# Patient Record
Sex: Female | Born: 1955 | Race: Black or African American | Hispanic: No | Marital: Married | Smoking: Never smoker
Health system: Southern US, Community
[De-identification: ages and names within clinical notes are randomized; demographics above are authoritative.]

## PROBLEM LIST (undated history)

## (undated) DIAGNOSIS — I10 Essential (primary) hypertension: Secondary | ICD-10-CM

## (undated) DIAGNOSIS — I4891 Unspecified atrial fibrillation: Secondary | ICD-10-CM

## (undated) DIAGNOSIS — E785 Hyperlipidemia, unspecified: Secondary | ICD-10-CM

## (undated) HISTORY — DX: Unspecified atrial fibrillation: I48.91

## (undated) HISTORY — DX: Essential (primary) hypertension: I10

## (undated) HISTORY — DX: Hyperlipidemia, unspecified: E78.5

## (undated) HISTORY — PX: OTHER SURGICAL HISTORY: SHX169

---

## 1999-10-11 ENCOUNTER — Ambulatory Visit (HOSPITAL_COMMUNITY): Admission: RE | Admit: 1999-10-11 | Discharge: 1999-10-11 | Payer: Self-pay | Admitting: Family Medicine

## 1999-12-24 ENCOUNTER — Ambulatory Visit (HOSPITAL_COMMUNITY): Admission: RE | Admit: 1999-12-24 | Discharge: 1999-12-24 | Payer: Self-pay | Admitting: Family Medicine

## 1999-12-24 ENCOUNTER — Encounter: Payer: Self-pay | Admitting: Family Medicine

## 2001-09-27 ENCOUNTER — Ambulatory Visit (HOSPITAL_COMMUNITY): Admission: RE | Admit: 2001-09-27 | Discharge: 2001-09-27 | Payer: Self-pay | Admitting: Family Medicine

## 2001-09-27 ENCOUNTER — Encounter: Payer: Self-pay | Admitting: Family Medicine

## 2002-10-10 ENCOUNTER — Ambulatory Visit (HOSPITAL_COMMUNITY): Admission: RE | Admit: 2002-10-10 | Discharge: 2002-10-10 | Payer: Self-pay | Admitting: Family Medicine

## 2003-11-30 ENCOUNTER — Ambulatory Visit (HOSPITAL_COMMUNITY): Admission: RE | Admit: 2003-11-30 | Discharge: 2003-11-30 | Payer: Self-pay | Admitting: Family Medicine

## 2004-06-17 ENCOUNTER — Ambulatory Visit: Payer: Self-pay | Admitting: Family Medicine

## 2004-09-11 ENCOUNTER — Ambulatory Visit: Payer: Self-pay | Admitting: Family Medicine

## 2004-11-25 ENCOUNTER — Ambulatory Visit: Payer: Self-pay | Admitting: Family Medicine

## 2004-12-03 ENCOUNTER — Ambulatory Visit (HOSPITAL_COMMUNITY): Admission: RE | Admit: 2004-12-03 | Discharge: 2004-12-03 | Payer: Self-pay | Admitting: Family Medicine

## 2005-02-25 ENCOUNTER — Ambulatory Visit: Payer: Self-pay | Admitting: Family Medicine

## 2005-06-13 ENCOUNTER — Ambulatory Visit: Payer: Self-pay | Admitting: Family Medicine

## 2005-06-24 ENCOUNTER — Ambulatory Visit: Payer: Self-pay | Admitting: *Deleted

## 2005-09-16 ENCOUNTER — Ambulatory Visit: Payer: Self-pay | Admitting: Family Medicine

## 2005-12-04 ENCOUNTER — Ambulatory Visit (HOSPITAL_COMMUNITY): Admission: RE | Admit: 2005-12-04 | Discharge: 2005-12-04 | Payer: Self-pay | Admitting: Family Medicine

## 2006-01-05 ENCOUNTER — Ambulatory Visit: Payer: Self-pay | Admitting: Family Medicine

## 2006-06-29 ENCOUNTER — Ambulatory Visit: Payer: Self-pay | Admitting: Family Medicine

## 2007-03-08 ENCOUNTER — Ambulatory Visit (HOSPITAL_COMMUNITY): Admission: RE | Admit: 2007-03-08 | Discharge: 2007-03-08 | Payer: Self-pay | Admitting: Family Medicine

## 2007-03-16 ENCOUNTER — Ambulatory Visit: Payer: Self-pay | Admitting: Family Medicine

## 2007-04-14 ENCOUNTER — Encounter (INDEPENDENT_AMBULATORY_CARE_PROVIDER_SITE_OTHER): Payer: Self-pay | Admitting: *Deleted

## 2007-09-20 ENCOUNTER — Ambulatory Visit: Payer: Self-pay | Admitting: Internal Medicine

## 2007-10-18 ENCOUNTER — Ambulatory Visit: Payer: Self-pay | Admitting: Family Medicine

## 2007-10-18 LAB — CONVERTED CEMR LAB
ALT: 20 U/L
AST: 21 U/L
Albumin: 4.4 g/dL
Alkaline Phosphatase: 65 U/L
BUN: 14 mg/dL
Basophils Absolute: 0.1 K/uL
Basophils Relative: 1 %
CO2: 24 meq/L
Calcium: 9.4 mg/dL
Chloride: 104 meq/L
Cholesterol: 212 mg/dL — ABNORMAL HIGH
Creatinine, Ser: 0.75 mg/dL
Eosinophils Absolute: 0.2 K/uL
Eosinophils Relative: 4 %
Glucose, Bld: 72 mg/dL
HCT: 39 %
HDL: 65 mg/dL
Hemoglobin: 12.1 g/dL
LDL Cholesterol: 118 mg/dL — ABNORMAL HIGH
Lymphocytes Relative: 48 % — ABNORMAL HIGH
Lymphs Abs: 2.8 K/uL
MCHC: 31 g/dL
MCV: 78.6 fL
Monocytes Absolute: 0.5 K/uL
Monocytes Relative: 9 %
Neutro Abs: 2.3 K/uL
Neutrophils Relative %: 39 % — ABNORMAL LOW
Platelets: 302 K/uL
Potassium: 4.1 meq/L
RBC: 4.96 M/uL
RDW: 13.8 %
Sodium: 142 meq/L
TSH: 0.529 u[IU]/mL
Total Bilirubin: 0.4 mg/dL
Total CHOL/HDL Ratio: 3.3
Total Protein: 7.7 g/dL
Triglycerides: 146 mg/dL
VLDL: 29 mg/dL
WBC: 5.8 10*3/microliter

## 2008-03-20 ENCOUNTER — Ambulatory Visit (HOSPITAL_COMMUNITY): Admission: RE | Admit: 2008-03-20 | Discharge: 2008-03-20 | Payer: Self-pay | Admitting: Family Medicine

## 2008-05-18 ENCOUNTER — Ambulatory Visit: Payer: Self-pay | Admitting: Family Medicine

## 2008-12-01 ENCOUNTER — Ambulatory Visit: Payer: Self-pay | Admitting: Family Medicine

## 2009-02-23 ENCOUNTER — Ambulatory Visit: Payer: Self-pay | Admitting: Family Medicine

## 2009-04-04 ENCOUNTER — Ambulatory Visit: Payer: Self-pay | Admitting: Internal Medicine

## 2009-04-13 ENCOUNTER — Ambulatory Visit: Payer: Self-pay | Admitting: Internal Medicine

## 2009-07-26 ENCOUNTER — Ambulatory Visit: Payer: Self-pay | Admitting: Family Medicine

## 2012-01-14 ENCOUNTER — Ambulatory Visit: Payer: Self-pay | Admitting: Family Medicine

## 2012-01-22 ENCOUNTER — Encounter: Payer: Self-pay | Admitting: Family Medicine

## 2012-01-22 ENCOUNTER — Ambulatory Visit (INDEPENDENT_AMBULATORY_CARE_PROVIDER_SITE_OTHER): Payer: Commercial Managed Care - PPO | Admitting: Family Medicine

## 2012-01-22 VITALS — BP 123/80 | HR 75 | Temp 98.3°F | Resp 16 | Ht 64.0 in | Wt 186.2 lb

## 2012-01-22 DIAGNOSIS — I10 Essential (primary) hypertension: Secondary | ICD-10-CM

## 2012-01-22 DIAGNOSIS — M79609 Pain in unspecified limb: Secondary | ICD-10-CM

## 2012-01-22 DIAGNOSIS — M79672 Pain in left foot: Secondary | ICD-10-CM

## 2012-01-22 DIAGNOSIS — D62 Acute posthemorrhagic anemia: Secondary | ICD-10-CM

## 2012-01-22 DIAGNOSIS — R002 Palpitations: Secondary | ICD-10-CM

## 2012-01-22 DIAGNOSIS — M5380 Other specified dorsopathies, site unspecified: Secondary | ICD-10-CM

## 2012-01-22 DIAGNOSIS — M79671 Pain in right foot: Secondary | ICD-10-CM

## 2012-01-22 DIAGNOSIS — M62830 Muscle spasm of back: Secondary | ICD-10-CM

## 2012-01-22 MED ORDER — CYCLOBENZAPRINE HCL 10 MG PO TABS: ORAL_TABLET | ORAL | Status: DC

## 2012-01-22 NOTE — Patient Instructions (Signed)
Get OTC Vitamin D3  2000 IU and take 1 capsule daily along with a Multivitamin or get Caltrate + D and take it daily.

## 2012-01-22 NOTE — Progress Notes (Signed)
  Subjective:    Patient ID: Mandy Price, female    DOB: [birth year], 56 y.o.   MRN: [id number]  HPI  This 56 y.o. 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 She wears gel inserts to work but takes no OTC NSAIDs or soak feet in evening for relief.  She has chronic muscle spasms in upper and mid back; Cyclobenzaprine helps. She has HTN  and takes chronic medication (Tenoretic) without side effects. She has occasional palpitations that  she notices most at end of long busy day. Drinks 8 oz tea at work (no coffee).    Review of Systems  Constitutional: Negative.   Respiratory: Negative for cough, chest tightness and shortness of breath.   Cardiovascular: Positive for palpitations. Negative for chest pain and leg swelling.  Musculoskeletal: Positive for back pain. Negative for myalgias and joint swelling.  Neurological: Negative for dizziness, weakness, light-headedness and headaches.       Objective:   Physical Exam  Nursing note and vitals reviewed. Constitutional: She is oriented to person, place, and time. She appears well-developed and well-nourished. No distress.  HENT:  Head: Normocephalic and atraumatic.  Eyes: EOM are normal. No scleral icterus.  Cardiovascular: Normal rate.   Pulmonary/Chest: Effort normal. No respiratory distress.  Musculoskeletal: Normal range of motion. She exhibits no edema.       Bilat (feet): low arches  Neurological: She is alert and oriented to person, place, and time. No cranial nerve deficit.  Skin: Skin is warm and dry.        Assessment & Plan:   1. HTN (hypertension) -stable; cont. current medication  2. Palpitations - monitor; ECG at  Next visit at time of CPE  3. Muscle spasm of back - refill cyclobenzaprine 10 mg  1 tablet hs prn.  4. Foot pain, bilateral - continue gel inserts in shoes and have more than one pair of work shoes

## 2012-04-28 ENCOUNTER — Ambulatory Visit (INDEPENDENT_AMBULATORY_CARE_PROVIDER_SITE_OTHER): Payer: Commercial Managed Care - PPO | Admitting: Family Medicine

## 2012-04-28 VITALS — BP 112/72 | HR 76 | Temp 98.8°F | Resp 16 | Ht 64.5 in | Wt 180.8 lb

## 2012-04-28 DIAGNOSIS — Z01419 Encounter for gynecological examination (general) (routine) without abnormal findings: Secondary | ICD-10-CM

## 2012-04-28 DIAGNOSIS — M549 Dorsalgia, unspecified: Secondary | ICD-10-CM

## 2012-04-28 DIAGNOSIS — Z13220 Encounter for screening for lipoid disorders: Secondary | ICD-10-CM

## 2012-04-28 DIAGNOSIS — Z1231 Encounter for screening mammogram for malignant neoplasm of breast: Secondary | ICD-10-CM

## 2012-04-28 DIAGNOSIS — Z124 Encounter for screening for malignant neoplasm of cervix: Secondary | ICD-10-CM

## 2012-04-28 DIAGNOSIS — Z130 Encounter for screening for diseases of the blood and blood-forming organs and certain disorders involving the immune mechanism: Secondary | ICD-10-CM

## 2012-04-28 DIAGNOSIS — E669 Obesity, unspecified: Secondary | ICD-10-CM | POA: Insufficient documentation

## 2012-04-28 DIAGNOSIS — I10 Essential (primary) hypertension: Secondary | ICD-10-CM

## 2012-04-28 DIAGNOSIS — Z0000 Encounter for general adult medical examination without abnormal findings: Secondary | ICD-10-CM

## 2012-04-28 DIAGNOSIS — M546 Pain in thoracic spine: Secondary | ICD-10-CM | POA: Insufficient documentation

## 2012-04-28 LAB — POCT URINALYSIS DIPSTICK
Ketones, UA: NEGATIVE
Urobilinogen, UA: 0.2
pH, UA: 7.5

## 2012-04-28 LAB — COMPREHENSIVE METABOLIC PANEL
Alkaline Phosphatase: 60 U/L (ref 39–117)
BUN: 13 mg/dL (ref 6–23)
CO2: 27 mEq/L (ref 19–32)
Calcium: 9.7 mg/dL (ref 8.4–10.5)
Chloride: 104 mEq/L (ref 96–112)
Creat: 0.93 mg/dL (ref 0.50–1.10)
Sodium: 140 mEq/L (ref 135–145)
Total Bilirubin: 0.5 mg/dL (ref 0.3–1.2)
Total Protein: 8.1 g/dL (ref 6.0–8.3)

## 2012-04-28 LAB — CBC WITH DIFFERENTIAL/PLATELET
Basophils Relative: 1 % (ref 0–1)
Eosinophils Absolute: 0.1 10*3/uL (ref 0.0–0.7)
HCT: 39.6 % (ref 36.0–46.0)
Hemoglobin: 12.8 g/dL (ref 12.0–15.0)
MCV: 77.6 fL — ABNORMAL LOW (ref 78.0–100.0)
Monocytes Absolute: 0.3 10*3/uL (ref 0.1–1.0)
Monocytes Relative: 7 % (ref 3–12)
Neutro Abs: 2.2 10*3/uL (ref 1.7–7.7)
Platelets: 260 10*3/uL (ref 150–400)
RBC: 5.1 MIL/uL (ref 3.87–5.11)
RDW: 13.8 % (ref 11.5–15.5)
WBC: 5 10*3/uL (ref 4.0–10.5)

## 2012-04-28 LAB — LIPID PANEL
LDL Cholesterol: 152 mg/dL — ABNORMAL HIGH (ref 0–99)
Triglycerides: 121 mg/dL (ref <150)

## 2012-04-28 MED ORDER — ATENOLOL-CHLORTHALIDONE 50-25 MG PO TABS: 1.0000 | ORAL_TABLET | Freq: Every day | ORAL | Status: DC

## 2012-04-28 NOTE — Patient Instructions (Addendum)
Keeping You Healthy  Get These Tests  Blood Pressure- Have your blood pressure checked by your healthcare provider at least once a year.  Normal blood pressure is 120/80.  Weight- Have your body mass index (BMI) calculated to screen for obesity.  BMI is a measure of body fat based on height and weight.  You can calculate your own BMI at https://www.west-esparza.com/  Cholesterol- Have your cholesterol checked every year.  Diabetes- Have your blood sugar checked every year if you have high blood pressure, high cholesterol, a family history of diabetes or if you are overweight.  Pap Smear- Have a pap smear every 1 to 3 years if you have been sexually active.  If you are older than 65 and recent pap smears have been normal you may not need additional pap smears.  In addition, if you have had a hysterectomy  For benign disease additional pap smears are not necessary.  Mammogram-Yearly mammograms are essential for early detection of breast cancer  Screening for Colon Cancer- Colonoscopy starting at age 60. Screening may begin sooner depending on your family history and other health conditions.  Follow up colonoscopy as directed by your Gastroenterologist.  Screening for Osteoporosis- Screening begins at age 22 with bone density scanning, sooner if you are at higher risk for developing Osteoporosis.  Get these medicines  Calcium with Vitamin D- Your body requires 1200-1500 mg of Calcium a day and [phone] IU of Vitamin D a day.  You can only absorb 500 mg of Calcium at a time therefore Calcium must be taken in 2 or 3 separate doses throughout the day.  Hormones- Hormone therapy has been associated with increased risk for certain cancers and heart disease.  Talk to your healthcare provider about if you need relief from menopausal symptoms.  Aspirin- Ask your healthcare provider about taking Aspirin to prevent Heart Disease and Stroke.  Get these Immuniztions  Flu shot- Every fall. You have  declined this vaccine.  Pneumonia shot- Once after the age of 88; if you are younger ask your healthcare provider if you need a pneumonia shot.  Tetanus- Every ten years. You reported this was done in 2007.  Zostavax- Once after the age of 72 to prevent shingles.  Take these steps  Don't smoke- Your healthcare provider can help you quit. For tips on how to quit, ask your healthcare provider or go to www.smokefree.gov or call 1-800 QUIT-NOW.  Be physically active- Exercise 5 days a week for a minimum of 30 minutes.  If you are not already physically active, start slow and gradually work up to 30 minutes of moderate physical activity.  Try walking, dancing, bike riding, swimming, etc.  Eat a healthy diet- Eat a variety of healthy foods such as fruits, vegetables, whole grains, low fat milk, low fat cheeses, yogurt, lean meats, chicken, fish, eggs, dried beans, tofu, etc.  For more information go to www.thenutritionsource.org  Dental visit- Brush and floss teeth twice daily; visit your dentist twice a year.  Eye exam- Visit your Optometrist or Ophthalmologist yearly.  Drink alcohol in moderation- Limit alcohol intake to one drink or less a day.  Never drink and drive.  Depression- Your emotional health is as important as your physical health.  If you're feeling down or losing interest in things you normally enjoy, please talk to your healthcare provider.  Seat Belts- can save your life; always wear one  Smoke/Carbon Monoxide detectors- These detectors need to be installed on the appropriate level of your home.  Replace batteries at least once a year.  Violence- If anyone is threatening or hurting you, please tell your healthcare provider.  Living Will/ Health care power of attorney- Discuss with your healthcare provider and family.       Hypertension As your heart beats, it forces blood through your arteries. This force is your blood pressure. If the pressure is too high, it is  called hypertension (HTN) or high blood pressure. HTN is dangerous because you may have it and not know it. High blood pressure may mean that your heart has to work harder to pump blood. Your arteries may be narrow or stiff. The extra work puts you at risk for heart disease, stroke, and other problems.  Blood pressure consists of two numbers, a higher number over a lower, 110/72, for example. It is stated as "110 over 72." The ideal is below 120 for the top number (systolic) and under 80 for the bottom (diastolic). Write down your blood pressure today. You should pay close attention to your blood pressure if you have certain conditions such as:  Heart failure.  Prior heart attack.  Diabetes  Chronic kidney disease.  Prior stroke.  Multiple risk factors for heart disease. To see if you have HTN, your blood pressure should be measured while you are seated with your arm held at the level of the heart. It should be measured at least twice. A one-time elevated blood pressure reading (especially in the Emergency Department) does not mean that you need treatment. There may be conditions in which the blood pressure is different between your right and left arms. It is important to see your caregiver soon for a recheck. Most people have essential hypertension which means that there is not a specific cause. This type of high blood pressure may be lowered by changing lifestyle factors such as:  Stress.  Smoking.  Lack of exercise.  Excessive weight.  Drug/tobacco/alcohol use.  Eating less salt. Most people do not have symptoms from high blood pressure until it has caused damage to the body. Effective treatment can often prevent, delay or reduce that damage. TREATMENT  When a cause has been identified, treatment for high blood pressure is directed at the cause. There are a large number of medications to treat HTN. These fall into several categories, and your caregiver will help you select the  medicines that are best for you. Medications may have side effects. You should review side effects with your caregiver. If your blood pressure stays high after you have made lifestyle changes or started on medicines,   Your medication(s) may need to be changed.  Other problems may need to be addressed.  Be certain you understand your prescriptions, and know how and when to take your medicine.  Be sure to follow up with your caregiver within the time frame advised (usually within two weeks) to have your blood pressure rechecked and to review your medications.  If you are taking more than one medicine to lower your blood pressure, make sure you know how and at what times they should be taken. Taking two medicines at the same time can result in blood pressure that is too low. SEEK IMMEDIATE MEDICAL CARE IF:  You develop a severe headache, blurred or changing vision, or confusion.  You have unusual weakness or numbness, or a faint feeling.  You have severe chest or abdominal pain, vomiting, or breathing problems. MAKE SURE YOU:   Understand these instructions.  Will watch your condition.  Will get  help right away if you are not doing well or get worse. Document Released: 07/14/2005 Document Revised: 10/06/2011 Document Reviewed: 03/03/2008 [facility] Patient Information 2013 [place], [place].      Exercise to Lose Weight Exercise and a healthy diet may help you lose weight. Your doctor may suggest specific exercises. EXERCISE IDEAS AND TIPS  Choose low-cost things you enjoy doing, such as walking, bicycling, or exercising to workout videos.  Take stairs instead of the elevator.  Walk during your lunch break.  Park your car further away from work or school.  Go to a gym or an exercise class.  Start with 5 to 10 minutes of exercise each day. Build up to 30 minutes of exercise 4 to 6 days a week.  Wear shoes with good support and comfortable clothes.  Stretch before and after  working out.  Work out until you breathe harder and your heart beats faster.  Drink extra water when you exercise.  Do not do so much that you hurt yourself, feel dizzy, or get very short of breath. Exercises that burn about 150 calories:  Running 1  miles in 15 minutes.  Playing volleyball for 45 to 60 minutes.  Washing and waxing a car for 45 to 60 minutes.  Playing touch football for 45 minutes.  Walking 1  miles in 35 minutes.  Pushing a stroller 1  miles in 30 minutes.  Playing basketball for 30 minutes.  Raking leaves for 30 minutes.  Bicycling 5 miles in 30 minutes.  Walking 2 miles in 30 minutes.  Dancing for 30 minutes.  Shoveling snow for 15 minutes.  Swimming laps for 20 minutes.  Walking up stairs for 15 minutes.  Bicycling 4 miles in 15 minutes.  Gardening for 30 to 45 minutes.  Jumping rope for 15 minutes.  Washing windows or floors for 45 to 60 minutes. Document Released: 08/16/2010 Document Revised: 10/06/2011 Document Reviewed: 08/16/2010 [facility] Patient Information 2013 [place], [place].     Shingles Shingles is caused by the same virus that causes chickenpox (varicella zoster virus or VZV). Shingles often occurs many years or decades after having chickenpox. That is why it is more common in adults older than 50 years. The virus reactivates and breaks out as an infection in a nerve root. SYMPTOMS   The initial feeling (sensations) may be pain. This pain is usually described as:  Burning.  Stabbing.  Throbbing.  Tingling in the nerve root.  A red rash will follow in a couple days. The rash may occur in any area of the body and is usually on one side (unilateral) of the body in a band or belt-like pattern. The rash usually starts out as very small blisters (vesicles). They will dry up after 7 to 10 days. This is not usually a significant problem except for the pain it causes.  Long-lasting (chronic) pain is more likely in an elderly  person. It can last months to years. This condition is called postherpetic neuralgia. Shingles can be an extremely severe infection in someone with AIDS, a weakened immune system, or with forms of leukemia. It can also be severe if you are taking transplant medicines or other medicines that weaken the immune system. TREATMENT  Your caregiver will often treat you with:  Antiviral drugs.  Anti-inflammatory drugs.  Pain medicines. Bed rest is very important in preventing the pain associated with herpes zoster (postherpetic neuralgia). Application of heat in the form of a hot water bottle or electric heating pad or gentle  pressure with the hand is recommended to help with the pain or discomfort. PREVENTION  A varicella zoster vaccine is available to help protect against the virus. The Food and Drug Administration approved the varicella zoster vaccine for individuals 10 years of age and older. HOME CARE INSTRUCTIONS   Cool compresses to the area of rash may be helpful.  Only take over-the-counter or prescription medicines for pain, discomfort, or fever as directed by your caregiver.  Avoid contact with:  Babies.  Pregnant women.  Children with eczema.  Elderly people with transplants.  People with chronic illnesses, such as leukemia and AIDS.  If the area involved is on your face, you may receive a referral for follow-up to a specialist. It is very important to keep all follow-up appointments. This will help avoid eye complications, chronic pain, or disability. SEEK IMMEDIATE MEDICAL CARE IF:   You develop any pain (headache) in the area of the face or eye. This must be followed carefully by your caregiver or ophthalmologist. An infection in part of your eye (cornea) can be very serious. It could lead to blindness.  You do not have pain relief from prescribed medicines.  Your redness or swelling spreads.  The area involved becomes very swollen and painful.  You have a  fever.  You notice any red or painful lines extending away from the affected area toward your heart (lymphangitis).  Your condition is worsening or has changed. Document Released: 07/14/2005 Document Revised: 10/06/2011 Document Reviewed: 06/18/2009 [facility] Patient Information 2013 [place], [place].

## 2012-04-29 ENCOUNTER — Encounter: Payer: Self-pay | Admitting: Family Medicine

## 2012-04-29 LAB — VITAMIN D 25 HYDROXY (VIT D DEFICIENCY, FRACTURES): Vit D, 25-Hydroxy: 38 ng/mL (ref 30–89)

## 2012-04-29 NOTE — Progress Notes (Signed)
Subjective:    Patient ID: Mandy Price, female    DOB: 1955/10/22, 56 y.o.   MRN: [id number]  HPI This 56 y.o. African female is here for CPE?PAP/ She cannot recall last PAP or mammogram.  She has HTN and is compliant with medication. Another chronic problem is back pain which has not  worsened over the years (she has no "red flag" symptoms).   Colonoscopy: pt declines at this time.    Review of Systems  Constitutional: Negative for fever, diaphoresis, appetite change, fatigue and unexpected weight change.  HENT: Negative.   Eyes: Negative.  Negative for visual disturbance.  Respiratory: Negative for cough, chest tightness and shortness of breath.   Cardiovascular: Negative for chest pain, palpitations and leg swelling.  Gastrointestinal: Negative.   Genitourinary: Negative.   Musculoskeletal: Positive for back pain. Negative for myalgias, joint swelling, arthralgias and gait problem.  Skin: Negative.   Neurological: Negative.   Hematological: Negative.   Psychiatric/Behavioral: Negative.        Objective:   Physical Exam  Nursing note and vitals reviewed. Constitutional: She is oriented to person, place, and time. She appears well-developed and well-nourished. No distress.  HENT:  Head: Normocephalic and atraumatic.  Right Ear: Hearing, tympanic membrane, external ear and ear canal normal.  Left Ear: Hearing, tympanic membrane, external ear and ear canal normal.  Nose: Nose normal. No nasal deformity or septal deviation.  Mouth/Throat: Uvula is midline, oropharynx is clear and moist and mucous membranes are normal. No oral lesions. Normal dentition. No dental caries.  Eyes: Conjunctivae normal, EOM and lids are normal. Pupils are equal, round, and reactive to light. No scleral icterus.  Fundoscopic exam:      The right eye shows no AV nicking, no hemorrhage and no papilledema.       The left eye shows no AV nicking, no hemorrhage and no papilledema.  Neck: Normal range  of motion. Neck supple. No thyromegaly present.  Cardiovascular: Normal rate, regular rhythm, normal heart sounds and intact distal pulses.  Exam reveals no gallop and no friction rub.   No murmur heard. Pulmonary/Chest: Effort normal and breath sounds normal. No respiratory distress. She has no wheezes. She exhibits no tenderness.  Abdominal: Soft. She exhibits no distension, no abdominal bruit, no pulsatile midline mass and no mass. Bowel sounds are decreased. There is no hepatosplenomegaly. There is no tenderness. There is no guarding and no CVA tenderness.  Genitourinary: Rectum normal, vagina normal and uterus normal. Rectal exam shows no external hemorrhoid, no fissure, no mass, no tenderness and anal tone normal. Guaiac negative stool. No breast swelling, tenderness, discharge or bleeding. There is no rash, tenderness or lesion on the right labia. There is no rash, tenderness or lesion on the left labia. Uterus is not deviated, not enlarged, not fixed and not tender. Cervix exhibits no motion tenderness, no discharge and no friability. Right adnexum displays no mass, no tenderness and no fullness. Left adnexum displays no mass, no tenderness and no fullness. No erythema, tenderness or bleeding around the vagina. No vaginal discharge found.  Musculoskeletal: Normal range of motion. She exhibits no edema and no tenderness.       Back (thoracic area)- minimal tenderness in paravertebral muscles  Lymphadenopathy:    She has no cervical adenopathy.       Right: No inguinal adenopathy present.       Left: No inguinal adenopathy present.  Neurological: She is alert and oriented to person, place, and time. She has normal  reflexes. No cranial nerve deficit. She exhibits normal muscle tone. Coordination normal.  Skin: Skin is warm and dry. No rash noted. No erythema.  Psychiatric: She has a normal mood and affect. Her behavior is normal. Judgment and thought content normal.          Assessment &  Plan:   1. Routine general medical examination at a health care facility  POCT urinalysis dipstick, IFOBT POC (occult bld, rslt in office)  2. Encounter for cervical Pap smear with pelvic exam  Pap IG w/ reflex to HPV when ASC-U  3. HTN (hypertension)  Comprehensive metabolic panel RX: Atenolol-Chlorthalidone 50-25 mg  #90  3 RFs  4. Screening for hyperlipidemia  Lipid panel  5. Back pain  Vitamin D, 25-hydroxy RX: Cyclobenzaprine 10 mg 1 tablet hs prn  6. Screening for deficiency anemia  CBC with Differential  7. Other screening mammogram  MM Digital Screening

## 2012-04-30 ENCOUNTER — Encounter: Payer: Self-pay | Admitting: Family Medicine

## 2012-05-02 NOTE — Progress Notes (Signed)
Quick Note:  Please call pt and advise that the following labs are abnormal... Your cholesterol is above normal (LDL-"bad"- cholesterol is too high). Try to improve your diet and exercise regularly. Get OTC Omega-3 Fish Oil 1200 mg (if you are not allergic to fish) and take it daily.  A good brand to try is Schiff MegaRed Omega-3 [place] [employer]- it is a small capsule and is well tolerated.  The rest of your labs are normal.] PAP is normal/negative.  Copy to pt. ______

## 2012-05-17 ENCOUNTER — Ambulatory Visit (HOSPITAL_COMMUNITY)
Admission: RE | Admit: 2012-05-17 | Discharge: 2012-05-17 | Disposition: A | Discharge status: Home / Self Care | Payer: Commercial Managed Care - PPO | Source: Ambulatory Visit | Attending: Family Medicine | Admitting: Family Medicine

## 2012-05-17 DIAGNOSIS — Z1231 Encounter for screening mammogram for malignant neoplasm of breast: Secondary | ICD-10-CM | POA: Insufficient documentation

## 2012-06-23 ENCOUNTER — Ambulatory Visit: Payer: Commercial Managed Care - PPO | Admitting: Family Medicine

## 2012-07-01 ENCOUNTER — Ambulatory Visit: Payer: Commercial Managed Care - PPO | Admitting: Family Medicine

## 2013-01-07 ENCOUNTER — Ambulatory Visit: Payer: Commercial Managed Care - PPO | Admitting: Family Medicine

## 2013-01-18 ENCOUNTER — Ambulatory Visit: Payer: Commercial Managed Care - PPO

## 2013-01-18 ENCOUNTER — Encounter: Payer: Self-pay | Admitting: Family Medicine

## 2013-01-18 ENCOUNTER — Ambulatory Visit (INDEPENDENT_AMBULATORY_CARE_PROVIDER_SITE_OTHER): Payer: Commercial Managed Care - PPO | Admitting: Family Medicine

## 2013-01-18 DIAGNOSIS — M79609 Pain in unspecified limb: Secondary | ICD-10-CM

## 2013-01-18 DIAGNOSIS — I10 Essential (primary) hypertension: Secondary | ICD-10-CM

## 2013-01-18 MED ORDER — TRAMADOL HCL 50 MG PO TABS: ORAL_TABLET | ORAL | Status: DC

## 2013-01-18 MED ORDER — ATENOLOL-CHLORTHALIDONE 50-25 MG PO TABS: 1.0000 | ORAL_TABLET | Freq: Every day | ORAL | Status: DC

## 2013-01-18 MED ORDER — CYCLOBENZAPRINE HCL 10 MG PO TABS: ORAL_TABLET | ORAL | Status: DC

## 2013-01-18 NOTE — Patient Instructions (Addendum)
I have ordered a referral to Orthopedics for further evaluation of right arm pain; the xray does not show any abnormality. A staff member will contact you with the appointment time and place with the specialist.

## 2013-01-18 NOTE — Progress Notes (Signed)
S:  This 57 y.o African female c/o R upper arm pain, increasing for the last 3-4 months. She works in [employer] for 18 years; job involves repetative movement (chopping, mixing, stirring, etc). Moving that arm increases pain and pt cannot lay on right side. She is awaken with arm pain; pt denies numbness or loss of use. She has not taken any  OTC analgesics.  Pt also has chronic foot pain, attributed to many hours on feet at work. She wears appropriate footwear and has some relief w/ gel insoles. When she first puts insoles in, she has great relief of foot pain. After ~3 months, the pain is back to original level.   Re: HTN- pt is compliant w/ medication w/o adverse effects. She denies diaphoresis, CP or tightness, palpitations, edema, SOB or DOE, wheezing or cough, HA, dizziness, numbness or syncope.   Patient Active Problem List   Diagnosis Date Noted  . Back pain, thoracic 04/28/2012  . Obesity (BMI 30.0-34.9) 04/28/2012  . HTN (hypertension) 01/22/2012   PMHx, Soc Hx and Fam Hx reviewed.   O: Filed Vitals:   01/18/13 1550  BP: 110/72  Pulse: 68  Temp: 99.2 F (37.3 C)  Resp: 16   GEN: In NAD; WN,WD. HENT: [place]/AT. EOMI w/ clear conj/sclerae. EACs/nose/oroph normal. COR: RRR. No edema. Distal pulses 2+/=. LUNGS: Normal resp rate and effort. SKIN: W&D; no rashes or erythema. MS: MAEs; R arm- tender proximal humerus w/ decreased ROM w/ internal rotation. No deformity visible or palpable.        Feet- no deformities, swelling or edema; neurovascular intact. Tender to palpation at midfoot along 4th and 5th metatarsals. NEURO: A&O x 3; CNs intact. Motor- giveway in R upper ext/ anterior deltoid.  No sensory deficits.   [facility] reading (PRIMARY) by  Dr. Audria Nine:  R humerus- no fracture of dislocation.  A/P: Upper arm pain, right -  RX: Tramadol 50 mg 1 tab bid or 1 tab hs for pain.  Plan: Ambulatory referral to Orthopedic Surgery  HTN (hypertension)- Stable and controlled;  continue current medications.  Meds ordered this encounter  Medications  . DISCONTD: atenolol-chlorthalidone (TENORETIC) 50-25 MG per tablet    Sig: Take 1 tablet by mouth daily. NEED REFILLS  . OMEGA-3 KRILL OIL PO    Sig: Take by mouth daily.  . traMADol (ULTRAM) 50 MG tablet    Sig: Take 1 tablet twice a day for pain or 1 tablet at bedtime for pain.    Dispense:  30 tablet    Refill:  1  . cyclobenzaprine (FLEXERIL) 10 MG tablet    Sig: Take 1 tablet at bedtime as needed for muscle spasms.    Dispense:  30 tablet    Refill:  3  . atenolol-chlorthalidone (TENORETIC) 50-25 MG per tablet    Sig: Take 1 tablet by mouth daily.    Dispense:  30 tablet    Refill:  11   Re: foot pain- pt advised to change out gel insoles every month.

## 2013-06-20 ENCOUNTER — Other Ambulatory Visit: Payer: Self-pay | Admitting: Family Medicine

## 2013-06-20 DIAGNOSIS — Z1231 Encounter for screening mammogram for malignant neoplasm of breast: Secondary | ICD-10-CM

## 2013-06-21 ENCOUNTER — Ambulatory Visit (HOSPITAL_COMMUNITY)
Admission: RE | Admit: 2013-06-21 | Discharge: 2013-06-21 | Disposition: A | Discharge status: Home / Self Care | Payer: Commercial Managed Care - PPO | Source: Ambulatory Visit | Attending: Family Medicine | Admitting: Family Medicine

## 2013-06-21 DIAGNOSIS — Z1231 Encounter for screening mammogram for malignant neoplasm of breast: Secondary | ICD-10-CM

## 2013-07-04 ENCOUNTER — Telehealth: Payer: Self-pay | Admitting: Radiology

## 2013-07-04 ENCOUNTER — Ambulatory Visit: Payer: Commercial Managed Care - PPO

## 2013-07-04 ENCOUNTER — Ambulatory Visit (INDEPENDENT_AMBULATORY_CARE_PROVIDER_SITE_OTHER): Payer: Commercial Managed Care - PPO | Admitting: Emergency Medicine

## 2013-07-04 ENCOUNTER — Ambulatory Visit
Admission: RE | Admit: 2013-07-04 | Discharge: 2013-07-04 | Disposition: A | Discharge status: Home / Self Care | Payer: Commercial Managed Care - PPO | Source: Ambulatory Visit | Attending: Emergency Medicine | Admitting: Emergency Medicine

## 2013-07-04 VITALS — BP 124/76 | HR 72 | Temp 98.5°F | Resp 17 | Ht 65.0 in | Wt 187.0 lb

## 2013-07-04 DIAGNOSIS — K529 Noninfective gastroenteritis and colitis, unspecified: Secondary | ICD-10-CM

## 2013-07-04 DIAGNOSIS — R109 Unspecified abdominal pain: Secondary | ICD-10-CM

## 2013-07-04 DIAGNOSIS — K921 Melena: Secondary | ICD-10-CM

## 2013-07-04 DIAGNOSIS — N390 Urinary tract infection, site not specified: Secondary | ICD-10-CM

## 2013-07-04 LAB — POCT CBC
Granulocyte percent: 66 %G (ref 37–80)
HCT, POC: 41.5 % (ref 37.7–47.9)
Lymph, poc: 2.8 (ref 0.6–3.4)
MCV: 80.8 fL (ref 80–97)
MID (cbc): 0.5 (ref 0–0.9)
MPV: 9.2 fL (ref 0–99.8)
POC Granulocyte: 6.3 (ref 2–6.9)
POC LYMPH PERCENT: 29 %L (ref 10–50)
POC MID %: 5 %M (ref 0–12)
WBC: 9.6 10*3/uL (ref 4.6–10.2)

## 2013-07-04 LAB — POCT URINALYSIS DIPSTICK
Bilirubin, UA: NEGATIVE
Ketones, UA: NEGATIVE
Nitrite, UA: NEGATIVE
Spec Grav, UA: 1.025

## 2013-07-04 LAB — IFOBT (OCCULT BLOOD): IFOBT: POSITIVE

## 2013-07-04 LAB — POCT UA - MICROSCOPIC ONLY

## 2013-07-04 MED ORDER — IOHEXOL 300 MG/ML  SOLN
100.0000 mL | Freq: Once | INTRAMUSCULAR | Status: AC | PRN
  Administered 2013-07-04: 100 mL via INTRAVENOUS

## 2013-07-04 MED ORDER — CIPROFLOXACIN HCL 500 MG PO TABS: 500.0000 mg | ORAL_TABLET | Freq: Two times a day (BID) | ORAL | Status: DC

## 2013-07-04 MED ORDER — METRONIDAZOLE 500 MG PO TABS: 500.0000 mg | ORAL_TABLET | Freq: Three times a day (TID) | ORAL | Status: DC

## 2013-07-04 MED ORDER — IOHEXOL 300 MG/ML  SOLN
40.0000 mL | Freq: Once | INTRAMUSCULAR | Status: AC | PRN
  Administered 2013-07-04: 40 mL via ORAL

## 2013-07-04 NOTE — Telephone Encounter (Signed)
Dr Cleta Alberts has spoken to patient regarding CT results.

## 2013-07-04 NOTE — Progress Notes (Deleted)
   Subjective:    Patient ID: Mandy Price, female    DOB: 1955-10-10, 57 y.o.   MRN: [id number]  HPI    Review of Systems     Objective:   Physical Exam        Assessment & Plan:

## 2013-07-04 NOTE — Patient Instructions (Signed)
    Driving directions to [address] [place], [place], [place] [address] [id number]  - more info    [address]  [place], [place] [address]     1. Head south on [address] toward [address]      0.5 mi    2. Sharp left onto [address]      0.6 mi    3. Turn left onto the [address] ramp      0.2 mi    4. Merge onto [address]      3.0 mi    5. Continue straight to stay on [address]      0.4 mi    6. Slight left to stay on [facility]  Destination will be on the right     1.0 mi     [address] [place], [place] [address]

## 2013-07-04 NOTE — Progress Notes (Addendum)
Subjective:    Patient ID: Mandy Price, female    DOB: [birth year], 57 y.o.   MRN: [id number]  This chart was scribed for Viviann Spare Aldona Bryner-MD, by Ladona Ridgel Day, Scribe. This patient was seen in room 4 and the patient's care was started at 9:10 AM.  HPI Mandy Price is a 57 y.o. female who presents to the [facility] complaining of rectal bleeding and lower abdominal pain, onset last PM after dinner (she had soup for dinner).  She reports onset of severe lower abdominal pain last PM and was in the bathroom for 2 hours feeling nauseated, diaphoretic and reports fever/chills since last PM. She denies any previous similar episodes.   She reports while she was in the bathroom last PM, she had a BM with bright red blood. She reports had another BM w/bright red blood this AM.   She has not had a colonscopy She denies family hx of colon problems.  Past Medical History  Diagnosis Date  . Hypertension    No past surgical history on file. No family history on file. History   Social History  . Marital Status: Married    Spouse Name: N/A    Number of Children: N/A  . Years of Education: N/A   Occupational History  . Not on file.   Social History Main Topics  . Smoking status: Never Smoker   . Smokeless tobacco: Not on file  . Alcohol Use: No  . Drug Use: No  . Sexual Activity: Not on file   Other Topics Concern  . Not on file   Social History Narrative  . No narrative on file   No Known Allergies Patient Active Problem List   Diagnosis Date Noted  . Back pain, thoracic 04/28/2012  . Obesity (BMI 30.0-34.9) 04/28/2012  . HTN (hypertension) 01/22/2012   No orders of the defined types were placed in this encounter.    Review of Systems  Constitutional: Positive for fever and chills.  HENT: Negative for congestion.   Respiratory: Negative for cough and shortness of breath.   Cardiovascular: Negative for chest pain.  Gastrointestinal: Positive for  abdominal pain (lower abdominal pain) and blood in stool. Negative for nausea, vomiting and constipation.  Musculoskeletal: Negative for back pain.  Skin: Negative for color change.   Triage Vitals: BP 124/76  Pulse 72  Temp(Src) 98.5 F (36.9 C) (Oral)  Resp 17  Ht 5\' 5"  (1.651 m)  Wt 187 lb (84.823 kg)  BMI 31.12 kg/m2  SpO2 99%     Objective:   Physical Exam Physical Exam  Nursing note and vitals reviewed. Constitutional: Patient is oriented to person, place, and time. Patient appears well-developed and well-nourished. No distress.  HENT:  Head: Normocephalic and atraumatic.  Neck: Neck supple. No tracheal deviation present.  Cardiovascular: Normal rate, regular rhythm and normal heart sounds.   No murmur heard. Pulmonary/Chest: Effort normal and breath sounds normal. No respiratory distress. Patient has no wheezes. Patient has no rales.  Musculoskeletal: Normal range of motion.  Neurological: Patient is alert and oriented to person, place, and time.  Skin: Skin is warm and dry.  Psychiatric: Patient has a normal mood and affect. Patient's behavior is normal.  ABD there is tenderness in the left lower quadrant and left midabdomen without rebound. Rectal exam reveals no rectal masses there is a mucousy stool present. Results for orders placed in visit on 07/04/13  POCT UA - MICROSCOPIC ONLY      Result Value  Range   WBC, Ur, HPF, POC 2-5     RBC, urine, microscopic 2-3     Bacteria, U Microscopic 2+     Mucus, UA positive     Epithelial cells, urine per micros 4-6     Crystals, Ur, HPF, POC neg     Casts, Ur, LPF, POC neg     Yeast, UA neg    POCT URINALYSIS DIPSTICK      Result Value Range   Color, UA yellow     Clarity, UA clear     Glucose, UA neg     Bilirubin, UA neg     Ketones, UA neg     Spec Grav, UA 1.025     Blood, UA small     pH, UA 6.5     Protein, UA trace     Urobilinogen, UA 0.2     Nitrite, UA neg     Leukocytes, UA small (1+)    POCT CBC       Result Value Range   WBC 9.6  4.6 - 10.2 K/uL   Lymph, poc 2.8  0.6 - 3.4   POC LYMPH PERCENT 29.0  10 - 50 %L   MID (cbc) 0.5  0 - 0.9   POC MID % 5.0  0 - 12 %M   POC Granulocyte 6.3  2 - 6.9   Granulocyte percent 66.0  37 - 80 %G   RBC 5.13  4.04 - 5.48 M/uL   Hemoglobin 12.6  12.2 - 16.2 g/dL   HCT, POC 96.0  45.4 - 47.9 %   MCV 80.8  80 - 97 fL   MCH, POC 24.6 (*) 27 - 31.2 pg   MCHC 30.4 (*) 31.8 - 35.4 g/dL   RDW, POC 09.8     Platelet Count, POC 260  142 - 424 K/uL   MPV 9.2  0 - 99.8 fL  IFOBT (OCCULT BLOOD)      Result Value Range   IFOBT Positive    [facility] reading (PRIMARY) by  Dr. Cleta Alberts there is no free air. There are no dilated loops of bowel. No acute changes are seen.      Assessment & Plan:   I. suspect this represents some form of colitis. We'll get a CT abdomen and pelvis if positive treat with Flagyl/ Cipro. referral made to GI since she has never had a colonoscopy.I personally performed the services described in this documentation, which was scribed in my presence. The recorded information has been reviewed and is accurate. CT scan shows a colitis. We'll treat with Cipro 500 twice a day for 10 days and Flagyl 500 twice a day for 10 days recheck in 48 hours

## 2013-07-05 ENCOUNTER — Encounter: Payer: Self-pay | Admitting: Internal Medicine

## 2013-07-05 LAB — URINE CULTURE

## 2013-07-06 ENCOUNTER — Ambulatory Visit (INDEPENDENT_AMBULATORY_CARE_PROVIDER_SITE_OTHER): Payer: Commercial Managed Care - PPO | Admitting: Emergency Medicine

## 2013-07-06 VITALS — BP 118/70 | HR 75 | Temp 98.0°F | Resp 16 | Ht 65.0 in | Wt 183.0 lb

## 2013-07-06 DIAGNOSIS — R109 Unspecified abdominal pain: Secondary | ICD-10-CM

## 2013-07-06 DIAGNOSIS — K529 Noninfective gastroenteritis and colitis, unspecified: Secondary | ICD-10-CM

## 2013-07-06 DIAGNOSIS — K5289 Other specified noninfective gastroenteritis and colitis: Secondary | ICD-10-CM

## 2013-07-06 LAB — POCT CBC
Granulocyte percent: 54.8 %G (ref 37–80)
MID (cbc): 0.5 (ref 0–0.9)
POC LYMPH PERCENT: 37.1 %L (ref 10–50)
RBC: 4.94 M/uL (ref 4.04–5.48)

## 2013-07-06 NOTE — Patient Instructions (Signed)
Recheck next Wednesday morning after 8:00

## 2013-07-06 NOTE — Progress Notes (Signed)
   Subjective:    Patient ID: Mandy Price, female    DOB: Mar 05, 1956, 57 y.o.   MRN: [id number]  HPI Pt here to follow up for colitis. Pt states she is feeling better. She states she has never had a colonscopy, but she has an appointment on Jan 8th to see GI. She states she has not had any vomitting, no hematochezia. She denies travel anywhere. She has never had anything else like this before. Husband has not been ill. She has been taking both abx.    Review of Systems     Objective:   Physical Exam chest is clear abdomen is flat normal bowel sounds there is still mild tenderness deep in the left lower abdomen but significantly better than the other day.   IMPRESSION:  1. Diffuse wall thickening in the descending colon, favoring  infectious colitis.  2. Diffuse hepatic steatosis.  3. Lower lumbar degenerative disc disease. Results for orders placed in visit on 07/06/13  POCT CBC      Result Value Range   WBC 6.4  4.6 - 10.2 K/uL   Lymph, poc 2.4  0.6 - 3.4   POC LYMPH PERCENT 37.1  10 - 50 %L   MID (cbc) 0.5  0 - 0.9   POC MID % 8.1  0 - 12 %M   POC Granulocyte 3.5  2 - 6.9   Granulocyte percent 54.8  37 - 80 %G   RBC 4.94  4.04 - 5.48 M/uL   Hemoglobin 12.2  12.2 - 16.2 g/dL   HCT, POC 11.9  14.7 - 47.9 %   MCV 80.5  80 - 97 fL   MCH, POC 24.7 (*) 27 - 31.2 pg   MCHC 30.7 (*) 31.8 - 35.4 g/dL   RDW, POC 82.9     Platelet Count, POC 282  142 - 424 K/uL   MPV 8.9  0 - 99.8 fL       Assessment & Plan:  White blood count is now down to normal. We'll recheck in one week

## 2013-08-04 ENCOUNTER — Ambulatory Visit: Payer: Commercial Managed Care - PPO | Admitting: Internal Medicine

## 2013-08-26 ENCOUNTER — Encounter: Payer: Self-pay | Admitting: Emergency Medicine

## 2014-01-26 ENCOUNTER — Encounter: Payer: Self-pay | Admitting: Family Medicine

## 2014-01-26 ENCOUNTER — Ambulatory Visit (INDEPENDENT_AMBULATORY_CARE_PROVIDER_SITE_OTHER): Payer: Commercial Managed Care - PPO | Admitting: Family Medicine

## 2014-01-26 VITALS — BP 119/69 | HR 70 | Temp 98.4°F | Resp 16 | Ht 65.0 in | Wt 182.0 lb

## 2014-01-26 DIAGNOSIS — Z0000 Encounter for general adult medical examination without abnormal findings: Secondary | ICD-10-CM

## 2014-01-26 DIAGNOSIS — R002 Palpitations: Secondary | ICD-10-CM

## 2014-01-26 DIAGNOSIS — M62830 Muscle spasm of back: Secondary | ICD-10-CM

## 2014-01-26 DIAGNOSIS — E669 Obesity, unspecified: Secondary | ICD-10-CM

## 2014-01-26 DIAGNOSIS — I10 Essential (primary) hypertension: Secondary | ICD-10-CM

## 2014-01-26 DIAGNOSIS — M5380 Other specified dorsopathies, site unspecified: Secondary | ICD-10-CM

## 2014-01-26 LAB — POCT URINALYSIS DIPSTICK
BILIRUBIN UA: NEGATIVE
Glucose, UA: NEGATIVE
KETONES UA: NEGATIVE
Nitrite, UA: NEGATIVE
PH UA: 7
Protein, UA: NEGATIVE
SPEC GRAV UA: 1.015
Urobilinogen, UA: 0.2

## 2014-01-26 LAB — POCT GLYCOSYLATED HEMOGLOBIN (HGB A1C): Hemoglobin A1C: 5.7

## 2014-01-26 MED ORDER — CYCLOBENZAPRINE HCL 10 MG PO TABS: ORAL_TABLET | ORAL | Status: DC

## 2014-01-26 MED ORDER — ATENOLOL-CHLORTHALIDONE 50-25 MG PO TABS: 1.0000 | ORAL_TABLET | Freq: Every day | ORAL | Status: DC

## 2014-01-26 NOTE — Progress Notes (Signed)
Subjective:    Patient ID: Mandy Price, female    DOB: 14-Jan-1956, 58 y.o.   MRN: [id number]  HPI  This 58 y.o. African female is here for annual CPE. She has well controlled HTN but c/o occasional palpitations. Chronic muscles spasms in back due to work -related activities are relieved w/ cyclobenzaprine prn.  HCM: MMG- Current (Nov 2014).           PAP- Oct 2013 (negative).           CRS- Pt declines referral.           IMM- Current but does not get Flu vaccine.   PMHx, Surg Hx, Soc and Fam Hx reviewed.  Review of Systems  Constitutional: Negative.   HENT: Negative.   Eyes: Negative.   Respiratory: Negative.   Cardiovascular: Positive for palpitations. Negative for chest pain.  Gastrointestinal: Negative.   Endocrine: Negative.   Genitourinary: Negative.   Musculoskeletal: Positive for back pain and myalgias. Negative for arthralgias and gait problem.  Skin: Negative.   Allergic/Immunologic: Negative.   Neurological: Negative.   Hematological: Negative.   Psychiatric/Behavioral: Negative.        Objective:   Physical Exam  Nursing note and vitals reviewed. Constitutional: She is oriented to person, place, and time. Vital signs are normal. She appears well-developed and well-nourished. No distress.  HENT:  Head: Normocephalic and atraumatic.  Right Ear: Hearing, tympanic membrane, external ear and ear canal normal.  Left Ear: Hearing, tympanic membrane, external ear and ear canal normal.  Nose: Nose normal. No mucosal edema, nasal deformity or septal deviation.  Mouth/Throat: Uvula is midline, oropharynx is clear and moist and mucous membranes are normal. No oral lesions. No dental caries.  Eyes: EOM and lids are normal. Pupils are equal, round, and reactive to light. Right conjunctiva is injected. Left conjunctiva is injected. No scleral icterus.  Fundoscopic exam:      The right eye shows no papilledema. The right eye shows red reflex.       The left eye shows  no papilledema. The left eye shows no red reflex.  Due for vision evaluation w/ a specialist.  Neck: Trachea normal, normal range of motion and full passive range of motion without pain. Neck supple. No JVD present. No spinous process tenderness and no muscular tenderness present. Carotid bruit is not present. No mass and no thyromegaly present.  Cardiovascular: Normal rate, regular rhythm, S1 normal, S2 normal, normal heart sounds and normal pulses.   No extrasystoles are present. PMI is not displaced.  Exam reveals no gallop, no S4 and no friction rub.   Pulmonary/Chest: Effort normal and breath sounds normal. No respiratory distress. She has no decreased breath sounds. Right breast exhibits no inverted nipple, no mass, no nipple discharge, no skin change and no tenderness. Left breast exhibits no inverted nipple, no mass, no nipple discharge, no skin change and no tenderness. Breasts are symmetrical.  Abdominal: Soft. Normal appearance and bowel sounds are normal. She exhibits no distension, no abdominal bruit, no pulsatile midline mass and no mass. There is no hepatosplenomegaly. There is no tenderness. There is no guarding and no CVA tenderness.  Genitourinary:  Deferred.  Musculoskeletal:       Cervical back: She exhibits tenderness and spasm. She exhibits normal range of motion, no swelling and no deformity.       Thoracic back: She exhibits tenderness. She exhibits normal range of motion, no deformity, no pain and no spasm.  Lumbar back: Normal.  Remainder of exam unremarkable.  Lymphadenopathy:       Head (right side): No submental, no submandibular, no tonsillar, no preauricular, no posterior auricular and no occipital adenopathy present.       Head (left side): No submental, no submandibular, no tonsillar, no preauricular, no posterior auricular and no occipital adenopathy present.    She has no cervical adenopathy.    She has no axillary adenopathy.       Right: No inguinal and no  supraclavicular adenopathy present.       Left: No inguinal and no supraclavicular adenopathy present.  Neurological: She is alert and oriented to person, place, and time. She has normal strength. She displays no atrophy and no tremor. No cranial nerve deficit or sensory deficit. She exhibits normal muscle tone. Coordination and gait normal. She displays no Babinski's sign on the right side. She displays no Babinski's sign on the left side.  Reflex Scores:      Tricep reflexes are 1+ on the right side and 1+ on the left side.      Bicep reflexes are 1+ on the right side and 1+ on the left side.      Brachioradialis reflexes are 1+ on the right side and 1+ on the left side.      Patellar reflexes are 2+ on the right side and 2+ on the left side. Skin: Skin is warm, dry and intact. No ecchymosis, no lesion and no rash noted. She is not diaphoretic. No cyanosis or erythema. Nails show no clubbing.  Psychiatric: She has a normal mood and affect. Her speech is normal and behavior is normal. Judgment and thought content normal. Cognition and memory are normal.    Results for orders placed in visit on 01/26/14  POCT GLYCOSYLATED HEMOGLOBIN (HGB A1C)      Result Value Ref Range   Hemoglobin A1C 5.7    POCT URINALYSIS DIPSTICK      Result Value Ref Range   Color, UA yellow     Clarity, UA cloudy     Glucose, UA neg     Bilirubin, UA neg     Ketones, UA neg     Spec Grav, UA 1.015     Blood, UA small     pH, UA 7.0     Protein, UA neg     Urobilinogen, UA 0.2     Nitrite, UA neg     Leukocytes, UA small (1+)      ECG: Sinus rhythm; nonspecific inferior St-T waves changes. No ectopy.     Assessment & Plan:  Routine general medical examination at a health care facility - Plan: EKG 12-Lead, IFOBT POC (occult bld, rslt in office), POCT glycosylated hemoglobin (Hb A1C), COMPLETE METABOLIC PANEL WITH GFR, Thyroid Panel With TSH, Lipid panel, POCT urinalysis dipstick  Pt given 3 hemosures to  complete at home and return to clinic within next 7-10 days.  Essential hypertension - Plan: EKG 12-Lead, COMPLETE METABOLIC PANEL WITH GFR  Palpitations - Plan: Thyroid Panel With TSH  Muscle spasm of back  Obesity (BMI 30.0-34.9)- Encouraged TLCs w/ focus on good nutrition and regular exercise.  Meds ordered this encounter  Medications  . atenolol-chlorthalidone (TENORETIC) 50-25 MG per tablet    Sig: Take 1 tablet by mouth daily.    Dispense:  30 tablet    Refill:  11  . cyclobenzaprine (FLEXERIL) 10 MG tablet    Sig: Take 1 tablet at bedtime as needed  for muscle spasms.    Dispense:  30 tablet    Refill:  3

## 2014-01-26 NOTE — Patient Instructions (Addendum)
Keeping You Healthy  Get These Tests  Blood Pressure- Have your blood pressure checked by your healthcare provider at least once a year.  Normal blood pressure is 120/80.  Weight- Have your body mass index (BMI) calculated to screen for obesity.  BMI is a measure of body fat based on height and weight.  You can calculate your own BMI at https://www.west-esparza.com/  Cholesterol- Have your cholesterol checked every year.  Diabetes- Have your blood sugar checked every year if you have high blood pressure, high cholesterol, a family history of diabetes or if you are overweight.  Pap Smear- Have a pap smear every 1 to 3 years if you have been sexually active.  If you are older than 65 and recent pap smears have been normal you may not need additional pap smears.  In addition, if you have had a hysterectomy  For benign disease additional pap smears are not necessary.  Mammogram-Yearly mammograms are essential for early detection of breast cancer  Screening for Colon Cancer- Colonoscopy starting at age 45. Screening may begin sooner depending on your family history and other health conditions.  Follow up colonoscopy as directed by your Gastroenterologist. You have declined to be referred for colonoscopy. You have been given stool sampling containers to take home for 3 separate stool specimens. Return them to [place] so that we can test your stools for blood.  Screening for Osteoporosis- Screening begins at age 68 with bone density scanning, sooner if you are at higher risk for developing Osteoporosis.  Get these medicines  Calcium with Vitamin D- Your body requires 1200-1500 mg of Calcium a day and [phone] IU of Vitamin D a day.  You can only absorb 500 mg of Calcium at a time therefore Calcium must be taken in 2 or 3 separate doses throughout the day.  Hormones- Hormone therapy has been associated with increased risk for certain cancers and heart disease.  Talk to your healthcare provider about if you  need relief from menopausal symptoms.  Aspirin- Ask your healthcare provider about taking Aspirin to prevent Heart Disease and Stroke.  Get these Immuniztions  Flu shot- Every fall  Pneumonia shot- Once after the age of 54; if you are younger ask your healthcare provider if you need a pneumonia shot.  Tetanus- Every ten years.  Zostavax- Once after the age of 34 to prevent shingles.  Take these steps  Don't smoke- Your healthcare provider can help you quit. For tips on how to quit, ask your healthcare provider or go to www.smokefree.gov or call 1-800 QUIT-NOW.  Be physically active- Exercise 5 days a week for a minimum of 30 minutes.  If you are not already physically active, start slow and gradually work up to 30 minutes of moderate physical activity.  Try walking, dancing, bike riding, swimming, etc.  Eat a healthy diet- Eat a variety of healthy foods such as fruits, vegetables, whole grains, low fat milk, low fat cheeses, yogurt, lean meats, chicken, fish, eggs, dried beans, tofu, etc.  For more information go to www.thenutritionsource.org  Dental visit- Brush and floss teeth twice daily; visit your dentist twice a year.  Eye exam- Visit your Optometrist or Ophthalmologist yearly.  Drink alcohol in moderation- Limit alcohol intake to one drink or less a day.  Never drink and drive.  Depression- Your emotional health is as important as your physical health.  If you're feeling down or losing interest in things you normally enjoy, please talk to your healthcare provider.  Seat Belts-  can save your life; always wear one  Smoke/Carbon Monoxide detectors- These detectors need to be installed on the appropriate level of your home.  Replace batteries at least once a year.  Violence- If anyone is threatening or hurting you, please tell your healthcare provider.  Living Will/ Health care power of attorney- Discuss with your healthcare provider and family.    Exercise to Lose  Weight Exercise and a healthy diet may help you lose weight. Your doctor may suggest specific exercises. EXERCISE IDEAS AND TIPS  Choose low-cost things you enjoy doing, such as walking, bicycling, or exercising to workout videos.  Take stairs instead of the elevator.  Walk during your lunch break.  Park your car further away from work or school.  Go to a gym or an exercise class.  Start with 5 to 10 minutes of exercise each day. Build up to 30 minutes of exercise 4 to 6 days a week.  Wear shoes with good support and comfortable clothes.  Stretch before and after working out.  Work out until you breathe harder and your heart beats faster.  Drink extra water when you exercise.  Do not do so much that you hurt yourself, feel dizzy, or get very short of breath. Exercises that burn about 150 calories:  Running 1  miles in 15 minutes.  Playing volleyball for 45 to 60 minutes.  Washing and waxing a car for 45 to 60 minutes.  Playing touch football for 45 minutes.  Walking 1  miles in 35 minutes.  Pushing a stroller 1  miles in 30 minutes.  Playing basketball for 30 minutes.  Raking leaves for 30 minutes.  Bicycling 5 miles in 30 minutes.  Walking 2 miles in 30 minutes.  Dancing for 30 minutes.  Shoveling snow for 15 minutes.  Swimming laps for 20 minutes.  Walking up stairs for 15 minutes.  Bicycling 4 miles in 15 minutes.  Gardening for 30 to 45 minutes.  Jumping rope for 15 minutes.  Washing windows or floors for 45 to 60 minutes. Document Released: 08/16/2010 Document Revised: 10/06/2011 Document Reviewed: 08/16/2010 [facility] Patient Information 2015 [place], [place]. This information is not intended to replace advice given to you by your health care provider. Make sure you discuss any questions you have with your health care provider.    Women and Heart Disease Heart disease (HD) risk factors for both men and women are similar. Most studies  addressing the diagnosis and treatment of heart disease have focused primarily on men. More research is now being done on women and heart disease.  GENDER DIFFERENCES  Symptoms of a heart attack for women may be more subtle, less typical and harder to identify.  Women are often slow to recognize heart disease risk factors and symptoms.  More women than men die from a heart attack before reaching a hospital.  Results of medical tests can vary by gender, especially electrocardiogram stress testing.  A common perception is that men are more likely to have heart problems. This is not true, especially in women after menopause.  Effects of estrogen, birth control and hormone therapy can have unique effects on the heart.  Women are more likely to be referred for a mental health evaluation of their symptoms. CAUSES Heart disease may be caused from conditions such as:  Buildup of fat-like deposits (plaques) in the blood vessels (coronary arteries) of the heart. The build up of fat-like deposits cause blockages that decrease the blood flow to the heart muscle.  Blockage or narrowing of the coronary arteries decreases oxygen to the heart muscle.  Abnormal heart rhythms or problems with the electrical system of the heart.  Heart muscle that has become enlarged or weak and cannot pump well.  Abnormal heart valves that either leak or are thickened and do not open and close properly.  Damage from infection or drugs.  Heart problems present at birth. RISK FACTORS  Family history.  Elevated blood lipid levels (cholesterol).  High blood pressure.  Diabetes.  Smoking.  Inactivity, lack of exercise.  Weighing 30% more than your ideal weight.  Age.  Past history of heart problems. SYMPTOMS  Chest discomfort or pressure, which may include the following:  Discomfort, fullness, tightness, squeezing in center of chest that stays for a few minutes or comes and goes.  Discomfort or  pressure that spreads to upper back, shoulders, neck, jaw or stomach.  Discomfort or tingling in the arms.  Profuse or clammy sweating.  Difficulty breathing.  Nausea.  Feeling your heart "flutter" or "jump."  Unexplained feelings of anxiety, fatigue or weakness.  Dizziness. DIAGNOSIS Diagnosis may include a test that:  Records the electrical activity of the heart and looks for changes (EKG [electrocardiogram]) .  Detects the presence of special proteins and enzymes that may show damage to the heart muscle (blood tests).  Looks at the blood flow through the heart and coronary arteries by using special dyes and X-rays (coronary angiography).  Uses sound waves to examine your heart valves, muscle function and blood flow within the heart (echo or echocardiogram).  Looks for symptoms as your heart works harder under stress (stress tests).  Creates images of the heart by detecting radiation following administration of a radioactive tracer (nuclear imaging).  Records the electrical activity of the heart and helps in detecting abnormalities of heart rhythm (electrophysiology).  Creates an image of the anatomy of the heart (CT heart scan). TREATMENT   Medications may be used to control your blood pressure, keep your heart beating regularly, reduce pain, and help your breathing.  If you are admitted to the hospital, the length of your stay depends on the amount of heart damage and any complications you may have.  Severe heart problems may require open heart surgery. This is a procedure where blocked coronary arteries are bypassed with a vein from your leg.  If you have a single small coronary artery blockage and no heart damage, you may have a balloon angioplasty. This procedure uses stents that may help open the blockage and restore normal heart circulation. Stents are small, wire, mesh-like tubes that help keep the artery open.  If needed, blood thinners may be used to dissolve  clots. HOME CARE INSTRUCTIONS  Follow the treatment plan your caregiver prescribes.  Keep a list of every medicine you are taking. Keep it up to date and with you all the time.  Get help from your caregiver or pharmacist to learn the following about each medicine:  Why you are taking it.  What time of day to take it.  Possible side effects.  What foods to take with your medication and which foods to avoid.  When to stop taking your medication.  Try to maintain normal cholesterol levels.  Eat a heart healthy diet with salt and fat restrictions as advised. PREVENTION  You can reduce your risk of heart disease by doing the following:  Visit your caregiver regularly and determine whether you are at risk.  Quit smoking and keep away from those  who smoke.  Get your blood pressure checked regularly and make sure it remains within normal limits.  Limit salt in your diet.  Maintain normal blood sugar and cholesterol levels.  Exercise regularly (walking or another form of aerobic activity, preferably 30 minutes continuously).  Maintain ideal body weight.  Reduce stress, anger, and depression.  If you have already had a heart attack, consult your caregiver about methods and medicines that may help in reducing your risk of having a second heart attack.  Be aware of the symptoms of heart disease and seek medical care if you develop these symptoms. SEEK IMMEDIATE MEDICAL CARE IF:  You have severe chest pain or the above symptoms, call your local emergency services (911 if in [place] THIS IS AN EMERGENCY. Do not wait to see if the pain will go away. DO NOT drive yourself to the hospital.  You notice increasing shortness of breath during rest, sleeping or with activity. Insist that providers take your complaints seriously and do a thorough heart evaluation. Document Released: 12/31/2007 Document Revised: 10/06/2011 Document Reviewed: 12/31/2007 [facility] CenterExit[facility] Patient Information 2015  [place]ExitCare, [place]LL[place]. This information is not intended to replace advice given to you by your health care provider. Make sure you discuss any questions you have with your health care provider.

## 2014-01-27 ENCOUNTER — Encounter: Payer: Self-pay | Admitting: Family Medicine

## 2014-01-27 LAB — THYROID PANEL WITH TSH
Free Thyroxine Index: 2.5 (ref 1.0–3.9)
T3 Uptake: 31.8 % (ref 22.5–37.0)
T4, Total: 7.8 ug/dL (ref 5.0–12.5)
TSH: 1.39 u[IU]/mL (ref 0.350–4.500)

## 2014-01-27 LAB — COMPLETE METABOLIC PANEL WITH GFR
ALBUMIN: 4.7 g/dL (ref 3.5–5.2)
ALK PHOS: 59 U/L (ref 39–117)
ALT: 25 U/L (ref 0–35)
AST: 27 U/L (ref 0–37)
BUN: 14 mg/dL (ref 6–23)
CO2: 27 mEq/L (ref 19–32)
CREATININE: 0.77 mg/dL (ref 0.50–1.10)
Calcium: 9.9 mg/dL (ref 8.4–10.5)
Chloride: 100 mEq/L (ref 96–112)
GFR, EST NON AFRICAN AMERICAN: 86 mL/min
GFR, Est African American: >89 mL/min
Glucose, Bld: 80 mg/dL (ref 70–99)
Potassium: 4 mEq/L (ref 3.5–5.3)
Sodium: 137 mEq/L (ref 135–145)
Total Bilirubin: 0.5 mg/dL (ref 0.2–1.2)
Total Protein: 8.1 g/dL (ref 6.0–8.3)

## 2014-01-27 LAB — LIPID PANEL
Cholesterol: 192 mg/dL (ref 0–200)
HDL: 67 mg/dL (ref >39)
LDL CALC: 111 mg/dL — AB (ref 0–99)
Total CHOL/HDL Ratio: 2.9 Ratio
Triglycerides: 68 mg/dL (ref <150)
VLDL: 14 mg/dL (ref 0–40)

## 2014-01-27 NOTE — Progress Notes (Signed)
Quick Note:  Please advise pt regarding following labs... All labs look very good. LDL ("bad") cholesterol is a little above normal but it has come down from 152 (value 1 year ago).  Thyroid function and Metabolic panel (salts ib blood, blood sugar, kidney and liver function tests) are normal.  Copy to pt. ______

## 2014-01-28 LAB — IFOBT (OCCULT BLOOD)
IFOBT: NEGATIVE
IFOBT: NEGATIVE
IMMUNOLOGICAL FECAL OCCULT BLOOD TEST: NEGATIVE

## 2014-02-06 ENCOUNTER — Telehealth: Payer: Self-pay

## 2014-02-06 MED ORDER — ATENOLOL-CHLORTHALIDONE 50-25 MG PO TABS: 1.0000 | ORAL_TABLET | Freq: Every day | ORAL | Status: DC

## 2014-02-06 NOTE — Telephone Encounter (Signed)
[facility] A 90 DAY SUPPLY OF ATENOLOL, BUT THE LAST TIME ONLY GAVE HER 30 AND THE PRICE WAS OUTRAGEOUS, WOULD LIKE HER TO CALL IN THE 90 DAY INSTEAD SINCE IT IS CHEAPER. PLEASE CALL [phone]    [facility] ON [address]

## 2014-02-07 NOTE — Telephone Encounter (Signed)
This was sent for her, left message for patient to advise.

## 2014-06-08 ENCOUNTER — Other Ambulatory Visit: Payer: Self-pay | Admitting: Family Medicine

## 2014-06-08 DIAGNOSIS — Z1231 Encounter for screening mammogram for malignant neoplasm of breast: Secondary | ICD-10-CM

## 2014-06-23 ENCOUNTER — Ambulatory Visit (HOSPITAL_COMMUNITY)
Admission: RE | Admit: 2014-06-23 | Discharge: 2014-06-23 | Disposition: A | Discharge status: Home / Self Care | Payer: Commercial Managed Care - PPO | Source: Ambulatory Visit | Attending: Family Medicine | Admitting: Family Medicine

## 2014-06-23 DIAGNOSIS — Z1231 Encounter for screening mammogram for malignant neoplasm of breast: Secondary | ICD-10-CM

## 2014-08-01 ENCOUNTER — Ambulatory Visit: Payer: Self-pay | Admitting: Family Medicine

## 2014-08-08 ENCOUNTER — Ambulatory Visit (INDEPENDENT_AMBULATORY_CARE_PROVIDER_SITE_OTHER): Payer: Commercial Managed Care - PPO | Admitting: Family Medicine

## 2014-08-08 ENCOUNTER — Encounter: Payer: Self-pay | Admitting: Family Medicine

## 2014-08-08 VITALS — BP 130/75 | HR 89 | Temp 98.1°F | Resp 16 | Ht 65.0 in | Wt 185.0 lb

## 2014-08-08 DIAGNOSIS — R29898 Other symptoms and signs involving the musculoskeletal system: Secondary | ICD-10-CM

## 2014-08-08 DIAGNOSIS — M2669 Other specified disorders of temporomandibular joint: Secondary | ICD-10-CM

## 2014-08-08 DIAGNOSIS — M722 Plantar fascial fibromatosis: Secondary | ICD-10-CM

## 2014-08-08 NOTE — Patient Instructions (Addendum)
Temporomandibular Problems  Temporomandibular joint (TMJ) dysfunction means there are problems with the joint between your jaw and your skull. This is a joint lined by cartilage like other joints in your body but also has a small disc in the joint which keeps the bones from rubbing on each other. These joints are like other joints and can get inflamed (sore) from arthritis and other problems. When this joint gets sore, it can cause headaches and pain in the jaw and the face. CAUSES  Usually the arthritic types of problems are caused by soreness in the joint. Soreness in the joint can also be caused by overuse. This may come from grinding your teeth. It may also come from mis-alignment in the joint. DIAGNOSIS Diagnosis of this condition can often be made by history and exam. Sometimes your caregiver may need X-rays or an MRI scan to determine the exact cause. It may be necessary to see your dentist to determine if your teeth and jaws are lined up correctly. TREATMENT  Most of the time this problem is not serious; however, sometimes it can persist (become chronic). When this happens medications that will cut down on inflammation (soreness) help. Sometimes a shot of cortisone into the joint will be helpful. If your teeth are not aligned it may help for your dentist to make a splint for your mouth that can help this problem. If no physical problems can be found, the problem may come from tension. If tension is found to be the cause, biofeedback or relaxation techniques may be helpful. HOME CARE INSTRUCTIONS   Later in the day, applications of ice packs may be helpful. Ice can be used in a plastic bag with a towel around it to prevent frostbite to skin. This may be used about every 2 hours for 20 to 30 minutes, as needed while awake, or as directed by your caregiver.  Only take over-the-counter or prescription medicines for pain, discomfort, or fever as directed by your caregiver.  If physical therapy was  prescribed, follow your caregiver's directions.  Wear mouth appliances as directed if they were given. Document Released: 04/08/2001 Document Revised: 10/06/2011 Document Reviewed: 07/16/2008 [facility]ExitCare Patient Information 2015 [place], [place]LL[place]. This information is not intended to replace advice given to you by your health care provider. Make sure you discuss any questions you have with your health care provider.    Plantar Fasciitis Plantar fasciitis is a common condition that causes foot pain. It is soreness (inflammation) of the band of tough fibrous tissue on the bottom of the foot that runs from the heel bone (calcaneus) to the ball of the foot. The cause of this soreness may be from excessive standing, poor fitting shoes, running on hard surfaces, being overweight, having an abnormal walk, or overuse (this is common in runners) of the painful foot or feet. It is also common in aerobic exercise dancers and ballet dancers. SYMPTOMS  Most people with plantar fasciitis complain of:  Severe pain in the morning on the bottom of their foot especially when taking the first steps out of bed. This pain recedes after a few minutes of walking.  Severe pain is experienced also during walking following a long period of inactivity.  Pain is worse when walking barefoot or up stairs DIAGNOSIS   Your caregiver will diagnose this condition by examining and feeling your foot.  Special tests such as X-rays of your foot, are usually not needed. PREVENTION   Consult a sports medicine professional before beginning a new exercise  program.  Walking programs offer a good workout. With walking there is a lower chance of overuse injuries common to runners. There is less impact and less jarring of the joints.  Begin all new exercise programs slowly. If problems or pain develop, decrease the amount of time or distance until you are at a comfortable level.  Wear good shoes and replace them regularly.  Stretch  your foot and the heel cords at the back of the ankle (Achilles tendon) both before and after exercise.  Run or exercise on even surfaces that are not hard. For example, asphalt is better than pavement.  Do not run barefoot on hard surfaces.  If using a treadmill, vary the incline.  Do not continue to workout if you have foot or joint problems. Seek professional help if they do not improve. HOME CARE INSTRUCTIONS   Avoid activities that cause you pain until you recover.  Use ice or cold packs on the problem or painful areas after working out.  Only take over-the-counter or prescription medicines for pain, discomfort, or fever as directed by your caregiver.  Soft shoe inserts or athletic shoes with air or gel sole cushions may be helpful.  If problems continue or become more severe, consult a sports medicine caregiver or your own health care provider. Cortisone is a potent anti-inflammatory medication that may be injected into the painful area. You can discuss this treatment with your caregiver. MAKE SURE YOU:   Understand these instructions.  Will watch your condition.  Will get help right away if you are not doing well or get worse. Document Released: 04/08/2001 Document Revised: 10/06/2011 Document Reviewed: 06/07/2008 [facility] Patient Information 2015 [place], [place]. This information is not intended to replace advice given to you by your health care provider. Make sure you discuss any questions you have with your health care provider.

## 2014-08-08 NOTE — Progress Notes (Signed)
S:  This 59 y.o. Female is has well controlled HTN and is compliant w/ medications. She is here today for evaluation of popping noise in her L ear when she is chewing. Onset 4 weeks ago, not associated w/ pain or spasm. She has no dental pain, ear pain, hearing loss, sore throat or difficulty swallowing. She feels well otherwise. Other minor c/o L heel pain, most severe upon weight bearing in the morning. She has special socks and shoes for work and has no foot pain when at work.  Patient Active Problem List   Diagnosis Date Noted  . Colitis 07/06/2013  . Back pain, thoracic 04/28/2012  . Obesity (BMI 30.0-34.9) 04/28/2012  . HTN (hypertension) 01/22/2012    Prior to Admission medications   Medication Sig Start Date End Date Taking? Authorizing Provider  atenolol-chlorthalidone (TENORETIC) 50-25 MG per tablet Take 1 tablet by mouth daily. 02/06/14  Yes Maurice MarchBarbara B Loetta Connelley, MD  cyclobenzaprine (FLEXERIL) 10 MG tablet Take 1 tablet at bedtime as needed for muscle spasms. 01/26/14  Yes Maurice MarchBarbara B Quinisha Mould, MD  OMEGA-3 KRILL OIL PO Take by mouth daily.   Yes Historical Provider, MD    ROS: As per HPI. Negative for diaphoresis, fatigue, vision disturbances, CP or tightness, palpitatinos, SOB or DOE, cough, edema, myalgias, back pain, HA, dizziness, lightheadedness, numbness, weakness or syncope.  O: Filed Vitals:   08/08/14 1609  BP: 130/75  Pulse: 89  Temp: 98.1 F (36.7 C)  Resp: 16   GEN: In NAD; WN,WD. HENT: [place]/AT; EOMI w/ clear conj/sclerae. Ext ears/EACs/TMs normal. Oroph w/ moist mucosa and good dentition, midline uvula , clear post pharynx. Click heard over both TMJs (L > R). NECK: Supple w/o LAN or TMG. COR: RRR. LUNGS: Unlabored resp. SKIN; W&D; intact . MS; Feet- heels appears normal w/o redness, swelling, deformity or point tenderness. NEURO: A&O x 3; CNs intact, Nonfocal.   A/P: TMJ click- Pt will consult her dentist about next step in evaluation. Advised to discontinue  chewing gum or eating other hard-to-chew foods or foods that require strong bite.  Plantar fasciitis of left foot- Heels cups and heel stretching before getting out of bed every morning. If symptoms fail to improve will refer to podiatry.

## 2015-02-05 ENCOUNTER — Ambulatory Visit: Payer: Self-pay | Admitting: Physician Assistant

## 2015-02-06 ENCOUNTER — Ambulatory Visit: Payer: Commercial Managed Care - PPO | Admitting: Family Medicine

## 2015-02-09 ENCOUNTER — Other Ambulatory Visit: Payer: Self-pay | Admitting: Family Medicine

## 2015-02-12 ENCOUNTER — Telehealth: Payer: Self-pay

## 2015-02-12 MED ORDER — ATENOLOL-CHLORTHALIDONE 50-25 MG PO TABS: 1.0000 | ORAL_TABLET | Freq: Every day | ORAL | Status: DC

## 2015-02-12 NOTE — Telephone Encounter (Signed)
Patient states that Mandy Price has not received her Atenolol 90 day supply refill yet. Please resubmit. Patient contact number [phone]    atenolol-chlorthalidone (TENORETIC) 50-25 MG per tablet 90 tablet 2 02/06/2014       Sig - Route: Take 1 tablet by mouth daily. - Oral     E-Prescribing Status: Receipt confirmed by pharmacy (02/06/2014 2:43 PM EDT)

## 2015-02-12 NOTE — Telephone Encounter (Signed)
Resent

## 2015-02-19 ENCOUNTER — Encounter: Payer: Self-pay | Admitting: Physician Assistant

## 2015-02-19 ENCOUNTER — Ambulatory Visit (INDEPENDENT_AMBULATORY_CARE_PROVIDER_SITE_OTHER): Payer: Commercial Managed Care - PPO | Admitting: Physician Assistant

## 2015-02-19 VITALS — BP 120/82 | HR 74 | Temp 98.6°F | Resp 16 | Ht 64.5 in | Wt 179.6 lb

## 2015-02-19 DIAGNOSIS — Z0000 Encounter for general adult medical examination without abnormal findings: Secondary | ICD-10-CM | POA: Diagnosis not present

## 2015-02-19 DIAGNOSIS — Z130 Encounter for screening for diseases of the blood and blood-forming organs and certain disorders involving the immune mechanism: Secondary | ICD-10-CM | POA: Diagnosis not present

## 2015-02-19 DIAGNOSIS — Z1239 Encounter for other screening for malignant neoplasm of breast: Secondary | ICD-10-CM

## 2015-02-19 DIAGNOSIS — I10 Essential (primary) hypertension: Secondary | ICD-10-CM | POA: Diagnosis not present

## 2015-02-19 DIAGNOSIS — Z1329 Encounter for screening for other suspected endocrine disorder: Secondary | ICD-10-CM | POA: Diagnosis not present

## 2015-02-19 DIAGNOSIS — Z124 Encounter for screening for malignant neoplasm of cervix: Secondary | ICD-10-CM

## 2015-02-19 DIAGNOSIS — Z13220 Encounter for screening for lipoid disorders: Secondary | ICD-10-CM

## 2015-02-19 DIAGNOSIS — Z23 Encounter for immunization: Secondary | ICD-10-CM | POA: Diagnosis not present

## 2015-02-19 MED ORDER — ATENOLOL-CHLORTHALIDONE 50-25 MG PO TABS: 1.0000 | ORAL_TABLET | Freq: Every day | ORAL | Status: DC

## 2015-02-19 MED ORDER — TYPHOID VACCINE PO CPDR: 1.0000 | DELAYED_RELEASE_CAPSULE | ORAL | Status: DC

## 2015-02-19 MED ORDER — ATOVAQUONE-PROGUANIL HCL 250-100 MG PO TABS: 1.0000 | ORAL_TABLET | Freq: Every day | ORAL | Status: DC

## 2015-02-19 NOTE — Progress Notes (Signed)
Subjective:    Patient ID: Mandy Price, female    DOB: 15-Aug-1955, 59 y.o.   MRN: [id number]  HPI Patient presents for complete physical without any complaints. Will be traveling to [place] in September of this year and needs vaccinations, but unsure of which ones she needs. Last traveled home in 2007.   Has been compliant with Tenoretic and needs refills to cover her while she is out of the country. Would like to switch BP medication, however, when she returns to the [place] as the price has gone from $10 to $20. Denies change in diet, but is exercising regularly. Has never smoked or used illicit drugs. Does not drink alcohol.   Has been married for 36 years and currently sexually active. Does not recall last pap, but was normal. Menopause when 59years old. Mammogram 2015 was normal.  Health Maintenance: Eye and Dental exam UTD. Declines colonoscopy. FTOB last year normal.   NKDA.  Review of Systems  Constitutional: Negative.  Negative for fever, chills, activity change, appetite change, fatigue and unexpected weight change.  HENT: Negative.  Negative for tinnitus.   Eyes: Negative.  Negative for photophobia and visual disturbance.  Respiratory: Negative.  Negative for cough, shortness of breath and wheezing.   Cardiovascular: Negative.  Negative for chest pain, palpitations and leg swelling.  Gastrointestinal: Negative.  Negative for nausea, vomiting, diarrhea, constipation and blood in stool.  Genitourinary: Negative.  Negative for dysuria, frequency, hematuria, decreased urine volume, vaginal bleeding, vaginal discharge and difficulty urinating.  Musculoskeletal: Negative for myalgias, back pain, joint swelling, arthralgias and gait problem.  Skin: Negative.   Allergic/Immunologic: Negative for environmental allergies.  Neurological: Negative.  Negative for dizziness, light-headedness and headaches.  Psychiatric/Behavioral: Negative.        Objective:   Physical Exam    Constitutional: She is oriented to person, place, and time. She appears well-developed and well-nourished. No distress.  Blood pressure 120/82, pulse 74, temperature 98.6 F (37 C), temperature source Oral, resp. rate 16, height 5' 4.5" (1.638 m), weight 179 lb 9.6 oz (81.466 kg), SpO2 99 %.  HENT:  Head: Normocephalic and atraumatic.  Right Ear: Tympanic membrane, external ear and ear canal normal.  Left Ear: Tympanic membrane, external ear and ear canal normal.  Nose: Nose normal.  Mouth/Throat: Uvula is midline, oropharynx is clear and moist and mucous membranes are normal.  Eyes: Conjunctivae and EOM are normal. Pupils are equal, round, and reactive to light. Right eye exhibits no discharge. Left eye exhibits no discharge. No scleral icterus.  Neck: Normal range of motion. Neck supple. No JVD present. Carotid bruit is not present. No thyromegaly present.  Cardiovascular: Normal rate, regular rhythm, normal heart sounds and intact distal pulses.  Exam reveals no gallop and no friction rub.   No murmur heard. Pulmonary/Chest: Effort normal and breath sounds normal. No respiratory distress. She has no decreased breath sounds. She has no wheezes. She has no rhonchi. She has no rales. She exhibits no tenderness.  Abdominal: Soft. Bowel sounds are normal. She exhibits no distension and no mass. There is no tenderness. There is no rebound, no guarding and no CVA tenderness. No hernia.  Genitourinary: Vagina normal and uterus normal. No vaginal discharge found.  Musculoskeletal: Normal range of motion. She exhibits no edema or tenderness.  Lymphadenopathy:    She has no cervical adenopathy.  Neurological: She is alert and oriented to person, place, and time. She has normal strength and normal reflexes. No cranial nerve deficit or sensory  deficit. She exhibits normal muscle tone. Coordination normal.  Skin: Skin is warm and dry. No rash noted. She is not diaphoretic. No erythema. No pallor.   Psychiatric: She has a normal mood and affect. Her behavior is normal. Judgment and thought content normal.      Assessment & Plan:  1. Annual physical exam Age anticipatory guidance given.  2. Essential hypertension Life style modifications discussed. Will call back to have BP medication switched to bisoprolol-HCTZ when returns. Will then schedule to have appt 4 weeks from start of new medication. - Comprehensive metabolic panel - atenolol-chlorthalidone (TENORETIC) 50-25 MG per tablet; Take 1 tablet by mouth daily.  Dispense: 90 tablet; Refill: 2  3. Screening for deficiency anemia - CBC  4. Screening for cholesterol level - Lipid panel  5. Screening for thyroid disorder - TSH  6. Screening for cervical cancer - Pap IG w/ reflex to HPV when ASC-U  7. Need for vaccination for cholera + typhoid/paratyphoid - typhoid (VIVOTIF) DR capsule; Take 1 capsule by mouth every other day. X 4 doses.  Dispense: 4 capsule; Refill: 0  8. Need for immunization against malaria - atovaquone-proguanil (MALARONE) 250-100 MG TABS; Take 1 tablet by mouth daily.  Dispense: 120 tablet; Refill: 0  9. Encounter for breast cancer screening other than mammogram - Ambulatory referral to Breast Clinic   Janan Ridge PA-C  [facility] 02/19/2015 4:22 PM

## 2015-02-19 NOTE — Patient Instructions (Signed)

## 2015-02-20 ENCOUNTER — Other Ambulatory Visit: Payer: Self-pay | Admitting: Physician Assistant

## 2015-02-20 DIAGNOSIS — Z1231 Encounter for screening mammogram for malignant neoplasm of breast: Secondary | ICD-10-CM

## 2015-02-20 LAB — CBC
HCT: 39.2 % (ref 36.0–46.0)
Hemoglobin: 12.4 g/dL (ref 12.0–15.0)
MCH: 24.6 pg — ABNORMAL LOW (ref 26.0–34.0)
MCHC: 31.6 g/dL (ref 30.0–36.0)
MCV: 77.8 fL — ABNORMAL LOW (ref 78.0–100.0)
MPV: 11.7 fL (ref 8.6–12.4)
Platelets: 257 10*3/uL (ref 150–400)
RBC: 5.04 MIL/uL (ref 3.87–5.11)
RDW: 14.2 % (ref 11.5–15.5)
WBC: 6.9 10*3/uL (ref 4.0–10.5)

## 2015-02-20 LAB — PAP IG W/ RFLX HPV ASCU

## 2015-02-20 LAB — COMPREHENSIVE METABOLIC PANEL
ALT: 26 U/L (ref 6–29)
AST: 27 U/L (ref 10–35)
Albumin: 4.7 g/dL (ref 3.6–5.1)
Alkaline Phosphatase: 51 U/L (ref 33–130)
BUN: 15 mg/dL (ref 7–25)
CALCIUM: 10.4 mg/dL (ref 8.6–10.4)
CHLORIDE: 101 meq/L (ref 98–110)
CO2: 27 meq/L (ref 20–31)
Creat: 0.84 mg/dL (ref 0.50–1.05)
GLUCOSE: 80 mg/dL (ref 65–99)
Potassium: 3.9 mEq/L (ref 3.5–5.3)
Sodium: 140 mEq/L (ref 135–146)
Total Bilirubin: 0.5 mg/dL (ref 0.2–1.2)
Total Protein: 8.2 g/dL — ABNORMAL HIGH (ref 6.1–8.1)

## 2015-02-20 LAB — LIPID PANEL
CHOL/HDL RATIO: 2.9 ratio (ref <=5.0)
CHOLESTEROL: 208 mg/dL — AB (ref 125–200)
HDL: 72 mg/dL (ref >=46)
LDL CALC: 113 mg/dL (ref <130)
Triglycerides: 113 mg/dL (ref <150)
VLDL: 23 mg/dL (ref <30)

## 2015-02-20 LAB — TSH: TSH: 2.294 u[IU]/mL (ref 0.350–4.500)

## 2015-02-23 ENCOUNTER — Encounter: Payer: Self-pay | Admitting: Physician Assistant

## 2015-02-28 ENCOUNTER — Other Ambulatory Visit: Payer: Self-pay

## 2015-02-28 DIAGNOSIS — I10 Essential (primary) hypertension: Secondary | ICD-10-CM

## 2015-02-28 MED ORDER — ATENOLOL-CHLORTHALIDONE 50-25 MG PO TABS: 1.0000 | ORAL_TABLET | Freq: Every day | ORAL | Status: DC

## 2015-04-30 ENCOUNTER — Other Ambulatory Visit: Payer: Self-pay | Admitting: Family Medicine

## 2015-04-30 ENCOUNTER — Ambulatory Visit (INDEPENDENT_AMBULATORY_CARE_PROVIDER_SITE_OTHER): Payer: Commercial Managed Care - PPO | Admitting: Family Medicine

## 2015-04-30 VITALS — BP 128/72 | HR 65 | Temp 98.2°F | Resp 16 | Ht 64.5 in | Wt 181.0 lb

## 2015-04-30 DIAGNOSIS — D509 Iron deficiency anemia, unspecified: Secondary | ICD-10-CM | POA: Diagnosis not present

## 2015-04-30 DIAGNOSIS — R6883 Chills (without fever): Secondary | ICD-10-CM | POA: Diagnosis not present

## 2015-04-30 LAB — POCT CBC
Granulocyte percent: 56.1 %G (ref 37–80)
HCT, POC: 40.4 % (ref 37.7–47.9)
HEMOGLOBIN: 12.1 g/dL — AB (ref 12.2–16.2)
LYMPH, POC: 2.6 (ref 0.6–3.4)
MCH, POC: 23 pg — AB (ref 27–31.2)
MCHC: 29.9 g/dL — AB (ref 31.8–35.4)
MCV: 76.9 fL — AB (ref 80–97)
MID (cbc): 0.5 (ref 0–0.9)
MPV: 7.4 fL (ref 0–99.8)
PLATELET COUNT, POC: 290 10*3/uL (ref 142–424)
POC Granulocyte: 4 (ref 2–6.9)
POC LYMPH %: 36.4 % (ref 10–50)
POC MID %: 7.5 %M (ref 0–12)
RBC: 5.26 M/uL (ref 4.04–5.48)
RDW, POC: 13.8 %
WBC: 7.1 10*3/uL (ref 4.6–10.2)

## 2015-04-30 LAB — COMPREHENSIVE METABOLIC PANEL
ALT: 22 U/L (ref 6–29)
AST: 23 U/L (ref 10–35)
Albumin: 4.3 g/dL (ref 3.6–5.1)
Alkaline Phosphatase: 54 U/L (ref 33–130)
BILIRUBIN TOTAL: 0.3 mg/dL (ref 0.2–1.2)
BUN: 13 mg/dL (ref 7–25)
CO2: 31 mmol/L (ref 20–31)
CREATININE: 0.85 mg/dL (ref 0.50–1.05)
Calcium: 9.7 mg/dL (ref 8.6–10.4)
Chloride: 100 mmol/L (ref 98–110)
Glucose, Bld: 118 mg/dL — ABNORMAL HIGH (ref 65–99)
Potassium: 3.8 mmol/L (ref 3.5–5.3)
SODIUM: 139 mmol/L (ref 135–146)
Total Protein: 7.3 g/dL (ref 6.1–8.1)

## 2015-04-30 LAB — TSH: TSH: 1.299 u[IU]/mL (ref 0.350–4.500)

## 2015-04-30 NOTE — Patient Instructions (Signed)
I will be in touch with the rest of your labs asap So far your blood count looks good Try taking some ibuprofen or tylenol- this may help with your symptoms If you are getting worse or if your symptoms change please let me know

## 2015-04-30 NOTE — Progress Notes (Signed)
[facility] [address], [place] [address] [phone]  Date:  04/30/2015   Name:  Mandy Price   DOB:  04-Mar-1956   MRN:  [id number]  PCP:  Dow Adolph, MD    Chief Complaint: Chills   History of Present Illness:  Mandy Price is a 59 y.o. very pleasant female patient who presents with the following:  Here today with complaint of illness- she has noted only chills, she does not really have body aches except her neck hurts some She has noted this for about 5 days She has not noted a fever- she does sometiems sweat at night No cough, no ST, no urinary sx No abd pain She is eating but does not have much appetite  No recent travel- she has an rx for malaria med but has not yet been on her trip No antipyretics used  She has never been a smoker menopausal  Patient Active Problem List   Diagnosis Date Noted  . Colitis 07/06/2013  . Back pain, thoracic 04/28/2012  . Obesity (BMI 30.0-34.9) 04/28/2012  . HTN (hypertension) 01/22/2012    Past Medical History  Diagnosis Date  . Hypertension     No past surgical history on file.  Social History  Substance Use Topics  . Smoking status: Never Smoker   . Smokeless tobacco: Never Used  . Alcohol Use: No    No family history on file.  No Known Allergies  Medication list has been reviewed and updated.  Current Outpatient Prescriptions on File Prior to Visit  Medication Sig Dispense Refill  . atenolol-chlorthalidone (TENORETIC) 50-25 MG per tablet Take 1 tablet by mouth daily. 90 tablet 2  . OMEGA-3 KRILL OIL PO Take by mouth daily.    Marland Kitchen atovaquone-proguanil (MALARONE) 250-100 MG TABS Take 1 tablet by mouth daily. (Patient not taking: Reported on 04/30/2015) 120 tablet 0  . typhoid (VIVOTIF) DR capsule Take 1 capsule by mouth every other day. X 4 doses. (Patient not taking: Reported on 04/30/2015) 4 capsule 0   No current facility-administered medications on file prior to visit.     Review of Systems:  As per HPI- otherwise negative.   Physical Examination: Filed Vitals:   04/30/15 1125  BP: 128/72  Pulse: 65  Temp: 98.2 F (36.8 C)  Resp: 16   Filed Vitals:   04/30/15 1125  Height: 5' 4.5" (1.638 m)  Weight: 181 lb (82.101 kg)   Body mass index is 30.6 kg/(m^2). Ideal Body Weight: Weight in (lb) to have BMI = 25: 147.6  GEN: WDWN, NAD, Non-toxic, A & O x 3, looks well HEENT: Atraumatic, Normocephalic. Neck supple. No masses, No LAD.  Bilateral TM wnl, oropharynx normal.  PEERL,EOMI.  Ears and Nose: No external deformity. CV: RRR, No M/G/R. No JVD. No thrill. No extra heart sounds. PULM: CTA B, no wheezes, crackles, rhonchi. No retractions. No resp. distress. No accessory muscle use. ABD: S, NT, ND, +BS. No rebound. No HSM.  Benign exam EXTR: No c/c/e NEURO Normal gait.  PSYCH: Normally interactive. Conversant. Not depressed or anxious appearing.  Calm demeanor.   Results for orders placed or performed in visit on 04/30/15  POCT CBC  Result Value Ref Range   WBC 7.1 4.6 - 10.2 K/uL   Lymph, poc 2.6 0.6 - 3.4   POC LYMPH PERCENT 36.4 10 - 50 %L   MID (cbc) 0.5 0 - 0.9   POC MID % 7.5 0 - 12 %M   POC  Granulocyte 4.0 2 - 6.9   Granulocyte percent 56.1 37 - 80 %G   RBC 5.26 4.04 - 5.48 M/uL   Hemoglobin 12.1 (A) 12.2 - 16.2 g/dL   HCT, POC 40.9 81.1 - 47.9 %   MCV 76.9 (A) 80 - 97 fL   MCH, POC 23.0 (A) 27 - 31.2 pg   MCHC 29.9 (A) 31.8 - 35.4 g/dL   RDW, POC 91.4 %   Platelet Count, POC 290 142 - 424 K/uL   MPV 7.4 0 - 99.8 fL    Assessment and Plan: Chills - Plan: POCT CBC, Comprehensive metabolic panel, TSH, Sedimentation Rate  Here today with complaint of chills. At this point she looks well, exam is benign and CBC is reassuring Encouraged her to try tylenol or ibuprofen for what may be a viral infection, and we will await further labs as above She will contact me if any worsening in the meantime   Signed Abbe Amsterdam,  MD

## 2015-05-01 ENCOUNTER — Encounter: Payer: Self-pay | Admitting: Family Medicine

## 2015-05-01 LAB — FERRITIN: Ferritin: 169 ng/mL (ref 10–291)

## 2015-05-01 LAB — SEDIMENTATION RATE: Sed Rate: 4 mm/hr (ref 0–30)

## 2015-05-03 ENCOUNTER — Encounter: Payer: Self-pay | Admitting: Family Medicine

## 2015-06-25 ENCOUNTER — Ambulatory Visit
Admission: RE | Admit: 2015-06-25 | Discharge: 2015-06-25 | Disposition: A | Discharge status: Home / Self Care | Payer: Commercial Managed Care - PPO | Source: Ambulatory Visit | Attending: Physician Assistant | Admitting: Physician Assistant

## 2015-06-25 ENCOUNTER — Encounter: Payer: Self-pay | Admitting: Physician Assistant

## 2015-06-25 DIAGNOSIS — Z1231 Encounter for screening mammogram for malignant neoplasm of breast: Secondary | ICD-10-CM

## 2015-09-15 ENCOUNTER — Other Ambulatory Visit: Payer: Self-pay | Admitting: Family Medicine

## 2015-12-14 ENCOUNTER — Other Ambulatory Visit: Payer: Self-pay | Admitting: Family Medicine

## 2016-03-03 ENCOUNTER — Telehealth: Payer: Self-pay

## 2016-03-03 NOTE — Telephone Encounter (Signed)
PATIENT IS REQUESTING A REFILL ON ATENOLOL CHLORTHALIDONE 50-25 MG. SHE STATES ALL OF HER DOCTORS ARE NO LONGER HERE. SHE USE TO SEE DR. MCPHERSON, Mitzi Davenport. [place] AND DR. COPLAND. SHE WILL SEE SARAH WEBER ON AUG. 14, 2017 TO HAVE A COMPLETE PHYSICAL DONE. SHE ONLY HAS A FEW PILLS LEFT. BEST PHONE [phone] (HOME)  PHARMACY CHOICE IS [facility].   MBC

## 2016-03-04 MED ORDER — ATENOLOL-CHLORTHALIDONE 50-25 MG PO TABS: ORAL_TABLET | ORAL | 0 refills | Status: DC

## 2016-03-04 NOTE — Telephone Encounter (Signed)
Called pt and she asked me to send in at least a 30 day supply to [facility] now and she will get the 90 day Rxs at [facility]. Pt did not want me to send in a few tabs locally because she will have to pay so much more for them. Sent the RF as agreed.

## 2016-05-05 ENCOUNTER — Ambulatory Visit (INDEPENDENT_AMBULATORY_CARE_PROVIDER_SITE_OTHER): Payer: Commercial Managed Care - PPO | Admitting: Physician Assistant

## 2016-05-05 VITALS — BP 136/76 | HR 89 | Temp 98.9°F | Resp 16 | Ht 64.5 in | Wt 187.0 lb

## 2016-05-05 DIAGNOSIS — I10 Essential (primary) hypertension: Secondary | ICD-10-CM

## 2016-05-05 DIAGNOSIS — G8929 Other chronic pain: Secondary | ICD-10-CM | POA: Diagnosis not present

## 2016-05-05 DIAGNOSIS — Z7189 Other specified counseling: Secondary | ICD-10-CM

## 2016-05-05 DIAGNOSIS — M546 Pain in thoracic spine: Secondary | ICD-10-CM | POA: Diagnosis not present

## 2016-05-05 DIAGNOSIS — M722 Plantar fascial fibromatosis: Secondary | ICD-10-CM

## 2016-05-05 LAB — COMPLETE METABOLIC PANEL WITH GFR
Albumin: 4.4 g/dL (ref 3.6–5.1)
Alkaline Phosphatase: 50 U/L (ref 33–130)
Calcium: 9.8 mg/dL (ref 8.6–10.4)
GFR, Est African American: 78 mL/min (ref >=60)
GFR, Est Non African American: 67 mL/min (ref >=60)
Glucose, Bld: 103 mg/dL — ABNORMAL HIGH (ref 65–99)
Potassium: 3.7 mmol/L (ref 3.5–5.3)
Sodium: 142 mmol/L (ref 135–146)
Total Bilirubin: 0.5 mg/dL (ref 0.2–1.2)
Total Protein: 7.7 g/dL (ref 6.1–8.1)

## 2016-05-05 LAB — COMPLETE METABOLIC PANEL WITHOUT GFR
ALT: 23 U/L (ref 6–29)
AST: 24 U/L (ref 10–35)
BUN: 16 mg/dL (ref 7–25)
CO2: 25 mmol/L (ref 20–31)
Chloride: 104 mmol/L (ref 98–110)
Creat: 0.93 mg/dL (ref 0.50–1.05)

## 2016-05-05 MED ORDER — ATENOLOL-CHLORTHALIDONE 50-25 MG PO TABS: ORAL_TABLET | ORAL | 0 refills | Status: DC

## 2016-05-05 MED ORDER — MELOXICAM 15 MG PO TABS: 15.0000 mg | ORAL_TABLET | Freq: Every day | ORAL | 1 refills | Status: DC

## 2016-05-05 NOTE — Progress Notes (Signed)
Mandy Price  MRN: [id number] DOB: [birth year]  PCP: Dow AdolphMCPHERSON,BARBARA, MD  Subjective:  Pt is a 60 year old female who presents to clinic for back and feet pain. Back pain x several years and foot pain x two months.  Foot pain - Located on the bottom of both feet. Her pain is worse in the mornings when she first gets out of bed and as she is walking around in the morning. Has not taken anything for the pain, has not tried stretches. She has never had this before.   Back pain - She has had mid-back pain for several years. Husband gives her massages sometimes, helps. Occasionally takes Flexeril about four times a year.   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   Medication refill for Hypertension - Well-controlled with Tenoretic. She needs a refill of her hypertension medications. Denies adverse side effects. BP today is 136/76.  Review of Systems  Constitutional: Negative for chills, fatigue and fever.  Respiratory: Negative for cough, shortness of breath and wheezing.   Cardiovascular: Negative for chest pain and palpitations.  Gastrointestinal: Negative for abdominal pain, diarrhea, nausea and vomiting.  Genitourinary: Negative for difficulty urinating, dysuria, flank pain and frequency.  Musculoskeletal: Positive for arthralgias, back pain and gait problem. Negative for joint swelling, neck pain and neck stiffness.  Neurological: Negative for dizziness, weakness, light-headedness and numbness.    Patient Active Problem List   Diagnosis Date Noted  . Colitis 07/06/2013  . Back pain, thoracic 04/28/2012  . Obesity (BMI 30.0-34.9) 04/28/2012  . HTN (hypertension) 01/22/2012    Current Outpatient Prescriptions on File Prior to Visit  Medication Sig Dispense Refill  . atenolol-chlorthalidone (TENORETIC) 50-25 MG tablet TAKE 1 TABLET DAILY 30 tablet 0  . OMEGA-3 KRILL OIL PO Take by mouth daily.     No current facility-administered  medications on file prior to visit.     No Known Allergies  Objective:  BP 136/76   Pulse 89   Temp 98.9 F (37.2 C) (Oral)   Resp 16   Ht 5' 4.5" (1.638 m)   Wt 187 lb (84.8 kg)   SpO2 100%   BMI 31.60 kg/m   Physical Exam  Constitutional: She is oriented to person, place, and time and well-developed, well-nourished, and in no distress. No distress.  Cardiovascular: Normal rate, regular rhythm and normal heart sounds.   Musculoskeletal:       Lumbar back: She exhibits tenderness. She exhibits normal range of motion, no bony tenderness, no swelling, no deformity and no spasm.  No deformity or erythema appreciated b/l feet. No decrease in ROM, strength or sensation. TTP with dorsiflexion of toes and palpation of fascia from calcaneous and along metatarsals.    Neurological: She is alert and oriented to person, place, and time. GCS score is 15.  Skin: Skin is warm and dry.  Psychiatric: Mood, memory, affect and judgment normal.  Vitals reviewed.   Assessment and Plan :  1. Encounter for medication counseling 2. Essential hypertension - COMPLETE METABOLIC PANEL WITH GFR - atenolol-chlorthalidone (TENORETIC) 50-25 MG tablet; TAKE 1 TABLET DAILY  Dispense: 30 tablet; Refill: 0 - Advised patient to make an appointment for an annual physical.  3. Chronic bilateral thoracic back pain - Ambulatory referral to Physical Therapy  4. Plantar fasciitis - meloxicam (MOBIC) 15 MG tablet; Take 1 tablet (15 mg total) by mouth daily.  Dispense: 30 tablet; Refill: 1 - Stretches shown  to and printed off for patient. Advised her to try a night splint to help relieve pain. RTC in 4-6 weeks if she is not better.    Marco Collie, PA-C  [facility] 05/05/2016 9:39 AM

## 2016-05-05 NOTE — Patient Instructions (Addendum)
Plantar Fascitis: See below for information about your feet. You can take Mobic as directed to help with your pain, as well as the stretches that are included below.  You can get a night splint for your foot at [place]: [address] [place], [place] [address] Phone: [phone] If you are not feeling relief from your foot pain in one month, come back and we can refer your to physical therapy.   Back pain: Physical therapy will call you to make an appointment.   Thank you for coming in today. I hope you feel we met your needs.  Feel free to call [facility] if you have any questions or further requests.  Please consider signing up for MyChart if you do not already have it, as this is a great way to communicate with me.  Best,  Whitney McVey, PA-C  IF you received an x-ray today, you will receive an invoice from [facility]. Please contact [facility] at [phone] with questions or concerns regarding your invoice.   IF you received labwork today, you will receive an invoice from [employer]. Please contact [facility] at [phone] with questions or concerns regarding your invoice.   Our billing staff will not be able to assist you with questions regarding bills from these companies.  You will be contacted with the lab results as soon as they are available. The fastest way to get your results is to activate your My Chart account. Instructions are located on the last page of this paperwork. If you have not heard from [place] regarding the results in 2 weeks, please contact this office.     Plantar Fasciitis With Rehab The plantar fascia is a fibrous, ligament-like, soft-tissue structure that spans the bottom of the foot. Plantar fasciitis, also called heel spur syndrome, is a condition that causes pain in the foot due to inflammation of the tissue. SYMPTOMS   Pain and tenderness on the underneath side of the foot.  Pain that worsens with  standing or walking. CAUSES  Plantar fasciitis is caused by irritation and injury to the plantar fascia on the underneath side of the foot. Common mechanisms of injury include:  Direct trauma to bottom of the foot.  Damage to a small nerve that runs under the foot where the main fascia attaches to the heel bone.  Stress placed on the plantar fascia due to bone spurs. RISK INCREASES WITH:   Activities that place stress on the plantar fascia (running, jumping, pivoting, or cutting).  Poor strength and flexibility.  Improperly fitted shoes.  Tight calf muscles.  Flat feet.  Failure to warm-up properly before activity.  Obesity. PREVENTION  Warm up and stretch properly before activity.  Allow for adequate recovery between workouts.  Maintain physical fitness:  Strength, flexibility, and endurance.  Cardiovascular fitness.  Maintain a health body weight.  Avoid stress on the plantar fascia.  Wear properly fitted shoes, including arch supports for individuals who have flat feet. PROGNOSIS  If treated properly, then the symptoms of plantar fasciitis usually resolve without surgery. However, occasionally surgery is necessary. RELATED COMPLICATIONS   Recurrent symptoms that may result in a chronic condition.  Problems of the lower back that are caused by compensating for the injury, such as limping.  Pain or weakness of the foot during push-off following surgery.  Chronic inflammation, scarring, and partial or complete fascia tear, occurring more often from repeated injections. TREATMENT  Treatment initially involves the use of ice and medication to  help reduce pain and inflammation. The use of strengthening and stretching exercises may help reduce pain with activity, especially stretches of the Achilles tendon. These exercises may be performed at home or with a therapist. Your caregiver may recommend that you use heel cups of arch supports to help reduce stress on the  plantar fascia. Occasionally, corticosteroid injections are given to reduce inflammation. If symptoms persist for greater than 6 months despite non-surgical (conservative), then surgery may be recommended.  MEDICATION   If pain medication is necessary, then nonsteroidal anti-inflammatory medications, such as aspirin and ibuprofen, or other minor pain relievers, such as acetaminophen, are often recommended.  Do not take pain medication within 7 days before surgery.  Prescription pain relievers may be given if deemed necessary by your caregiver. Use only as directed and only as much as you need.  Corticosteroid injections may be given by your caregiver. These injections should be reserved for the most serious cases, because they may only be given a certain number of times. HEAT AND COLD  Cold treatment (icing) relieves pain and reduces inflammation. Cold treatment should be applied for 10 to 15 minutes every 2 to 3 hours for inflammation and pain and immediately after any activity that aggravates your symptoms. Use ice packs or massage the area with a piece of ice (ice massage).  Heat treatment may be used prior to performing the stretching and strengthening activities prescribed by your caregiver, physical therapist, or athletic trainer. Use a heat pack or soak the injury in warm water. SEEK IMMEDIATE MEDICAL CARE IF:  Treatment seems to offer no benefit, or the condition worsens.  Any medications produce adverse side effects. EXERCISES RANGE OF MOTION (ROM) AND STRETCHING EXERCISES - Plantar Fasciitis (Heel Spur Syndrome) These exercises may help you when beginning to rehabilitate your injury. Your symptoms may resolve with or without further involvement from your physician, physical therapist or athletic trainer. While completing these exercises, remember:   Restoring tissue flexibility helps normal motion to return to the joints. This allows healthier, less painful movement and  activity.  An effective stretch should be held for at least 30 seconds.  A stretch should never be painful. You should only feel a gentle lengthening or release in the stretched tissue. RANGE OF MOTION - Toe Extension, Flexion  Sit with your right / left leg crossed over your opposite knee.  Grasp your toes and gently pull them back toward the top of your foot. You should feel a stretch on the bottom of your toes and/or foot.  Hold this stretch for __________ seconds.  Now, gently pull your toes toward the bottom of your foot. You should feel a stretch on the top of your toes and or foot.  Hold this stretch for __________ seconds. Repeat __________ times. Complete this stretch __________ times per day.  RANGE OF MOTION - Ankle Dorsiflexion, Active Assisted  Remove shoes and sit on a chair that is preferably not on a carpeted surface.  Place right / left foot under knee. Extend your opposite leg for support.  Keeping your heel down, slide your right / left foot back toward the chair until you feel a stretch at your ankle or calf. If you do not feel a stretch, slide your bottom forward to the edge of the chair, while still keeping your heel down.  Hold this stretch for __________ seconds. Repeat __________ times. Complete this stretch __________ times per day.  STRETCH - Gastroc, Standing  Place hands on wall.  Extend  right / left leg, keeping the front knee somewhat bent.  Slightly point your toes inward on your back foot.  Keeping your right / left heel on the floor and your knee straight, shift your weight toward the wall, not allowing your back to arch.  You should feel a gentle stretch in the right / left calf. Hold this position for __________ seconds. Repeat __________ times. Complete this stretch __________ times per day. STRETCH - Soleus, Standing  Place hands on wall.  Extend right / left leg, keeping the other knee somewhat bent.  Slightly point your toes inward  on your back foot.  Keep your right / left heel on the floor, bend your back knee, and slightly shift your weight over the back leg so that you feel a gentle stretch deep in your back calf.  Hold this position for __________ seconds. Repeat __________ times. Complete this stretch __________ times per day. STRETCH - Gastrocsoleus, Standing  Note: This exercise can place a lot of stress on your foot and ankle. Please complete this exercise only if specifically instructed by your caregiver.   Place the ball of your right / left foot on a step, keeping your other foot firmly on the same step.  Hold on to the wall or a rail for balance.  Slowly lift your other foot, allowing your body weight to press your heel down over the edge of the step.  You should feel a stretch in your right / left calf.  Hold this position for __________ seconds.  Repeat this exercise with a slight bend in your right / left knee. Repeat __________ times. Complete this stretch __________ times per day.  STRENGTHENING EXERCISES - Plantar Fasciitis (Heel Spur Syndrome)  These exercises may help you when beginning to rehabilitate your injury. They may resolve your symptoms with or without further involvement from your physician, physical therapist or athletic trainer. While completing these exercises, remember:   Muscles can gain both the endurance and the strength needed for everyday activities through controlled exercises.  Complete these exercises as instructed by your physician, physical therapist or athletic trainer. Progress the resistance and repetitions only as guided. STRENGTH - Towel Curls  Sit in a chair positioned on a non-carpeted surface.  Place your foot on a towel, keeping your heel on the floor.  Pull the towel toward your heel by only curling your toes. Keep your heel on the floor.  If instructed by your physician, physical therapist or athletic trainer, add ____________________ at the end of the  towel. Repeat __________ times. Complete this exercise __________ times per day. STRENGTH - Ankle Inversion  Secure one end of a rubber exercise band/tubing to a fixed object (table, pole). Loop the other end around your foot just before your toes.  Place your fists between your knees. This will focus your strengthening at your ankle.  Slowly, pull your big toe up and in, making sure the band/tubing is positioned to resist the entire motion.  Hold this position for __________ seconds.  Have your muscles resist the band/tubing as it slowly pulls your foot back to the starting position. Repeat __________ times. Complete this exercises __________ times per day.    This information is not intended to replace advice given to you by your health care provider. Make sure you discuss any questions you have with your health care provider.   Document Released: 07/14/2005 Document Revised: 11/28/2014 Document Reviewed: 10/26/2008 Elsevier Interactive Patient Education [employer].

## 2016-05-08 ENCOUNTER — Telehealth: Payer: Self-pay

## 2016-05-08 NOTE — Telephone Encounter (Signed)
Patient called in very upset because she was just seen on Monday 05/05/16 by Marco CollieWhitney McVey and she states she told her that she always get a 90 day supply of her atenolol-chlorthalidone (TENORETIC) 50-25 MG tablet and she was only given 30 days with no refills.  She would like for this to be corrected in the system and the remaining 60 pills called into her pharmacy.  Also she would like the meloxicam (MOBIC) 15 MG tablet taken off her records because she states she did not ask for this and she told the doctor she did not want this.  Her call back number is [phone]

## 2016-05-09 NOTE — Telephone Encounter (Signed)
Whitney, Please advise regarding patient message...thanks!

## 2016-05-10 ENCOUNTER — Encounter: Payer: Self-pay | Admitting: Physician Assistant

## 2016-05-10 MED ORDER — ATENOLOL-CHLORTHALIDONE 50-25 MG PO TABS: ORAL_TABLET | ORAL | 1 refills | Status: DC

## 2016-05-10 NOTE — Telephone Encounter (Signed)
Called and spoke with patient's husband. As dicussed at her OV, I prescribed her one month of medication as she needs to come back and be seen for an annual physical with a provider. She is to call me back on my cell phone if she has any further questions. Thank you!

## 2016-05-10 NOTE — Addendum Note (Signed)
Addended by: Sebastian AcheMCVEY, Erlene Devita WHITNEY on: 05/10/2016 02:51 PM   Modules accepted: Orders

## 2016-05-16 ENCOUNTER — Ambulatory Visit (INDEPENDENT_AMBULATORY_CARE_PROVIDER_SITE_OTHER): Payer: Commercial Managed Care - PPO | Admitting: Physician Assistant

## 2016-05-16 VITALS — BP 120/80 | HR 70 | Temp 98.4°F | Resp 18 | Ht 64.5 in | Wt 190.0 lb

## 2016-05-16 DIAGNOSIS — Z130 Encounter for screening for diseases of the blood and blood-forming organs and certain disorders involving the immune mechanism: Secondary | ICD-10-CM | POA: Diagnosis not present

## 2016-05-16 DIAGNOSIS — Z1329 Encounter for screening for other suspected endocrine disorder: Secondary | ICD-10-CM | POA: Diagnosis not present

## 2016-05-16 DIAGNOSIS — M545 Low back pain, unspecified: Secondary | ICD-10-CM

## 2016-05-16 DIAGNOSIS — Z1211 Encounter for screening for malignant neoplasm of colon: Secondary | ICD-10-CM | POA: Diagnosis not present

## 2016-05-16 DIAGNOSIS — Z13228 Encounter for screening for other metabolic disorders: Secondary | ICD-10-CM

## 2016-05-16 DIAGNOSIS — Z0000 Encounter for general adult medical examination without abnormal findings: Secondary | ICD-10-CM | POA: Diagnosis not present

## 2016-05-16 DIAGNOSIS — G8929 Other chronic pain: Secondary | ICD-10-CM

## 2016-05-16 DIAGNOSIS — Z1159 Encounter for screening for other viral diseases: Secondary | ICD-10-CM

## 2016-05-16 DIAGNOSIS — Z114 Encounter for screening for human immunodeficiency virus [HIV]: Secondary | ICD-10-CM | POA: Diagnosis not present

## 2016-05-16 DIAGNOSIS — Z13220 Encounter for screening for lipoid disorders: Secondary | ICD-10-CM | POA: Diagnosis not present

## 2016-05-16 DIAGNOSIS — I10 Essential (primary) hypertension: Secondary | ICD-10-CM

## 2016-05-16 MED ORDER — CYCLOBENZAPRINE HCL 5 MG PO TABS: 5.0000 mg | ORAL_TABLET | Freq: Three times a day (TID) | ORAL | 0 refills | Status: DC | PRN

## 2016-05-16 MED ORDER — ATENOLOL-CHLORTHALIDONE 50-25 MG PO TABS: ORAL_TABLET | ORAL | 1 refills | Status: DC

## 2016-05-16 NOTE — Progress Notes (Signed)
Mandy Price  MRN: [id number] DOB: 20-Jan-1956  Subjective:  Pt presents to clinic for a CPE.  She is doing well.  She has no concerns today.  Last dental exam: year ago Last vision exam: wears glasses - last year was checked Last pap smear: 7/16 - normal Last mammogram: 05/2015 Last colonoscopy: no interested Vaccinations      Tetanus - pt decline - she thinks that she has had  Patient Active Problem List - reviewed with the patient   Diagnosis Date Noted  . Colitis 07/06/2013  . Back pain, thoracic - pt would like more muscle relaxers that have helped in the past 04/28/2012  . Obesity (BMI 30.0-34.9) 04/28/2012  . HTN (hypertension) 01/22/2012    Current Outpatient Prescriptions on File Prior to Visit  Medication Sig Dispense Refill  . OMEGA-3 KRILL OIL PO Take by mouth daily.     No current facility-administered medications on file prior to visit.     No Known Allergies  Social History   Social History  . Marital status: Married    Spouse name: N/A  . Number of children: N/A  . Years of education: N/A   Social History Main Topics  . Smoking status: Never Smoker  . Smokeless tobacco: Never Used  . Alcohol use No  . Drug use: No  . Sexual activity: Yes    Partners: Male   Other Topics Concern  . None   Social History Narrative   Married   4 grown Psychologist, counselling    History reviewed. No pertinent surgical history.  History reviewed. No pertinent family history.  Review of Systems  Musculoskeletal: Positive for gait problem (chronic - not currently bnothering her - rare flairs).   Objective:  BP 120/80 (BP Location: Right Arm, Patient Position: Sitting, Cuff Size: Small)   Pulse 70   Temp 98.4 F (36.9 C) (Oral)   Resp 18   Ht 5' 4.5" (1.638 m)   Wt 190 lb (86.2 kg)   SpO2 100%   BMI 32.11 kg/m   Physical Exam  Constitutional: She is oriented to person, place, and time and well-developed, well-nourished, and in no distress.    HENT:  Head: Normocephalic and atraumatic.  Right Ear: Hearing, tympanic membrane, external ear and ear canal normal.  Left Ear: Hearing, tympanic membrane, external ear and ear canal normal.  Nose: Nose normal.  Mouth/Throat: Uvula is midline, oropharynx is clear and moist and mucous membranes are normal.  Eyes: Conjunctivae and EOM are normal. Pupils are equal, round, and reactive to light.  Neck: Trachea normal and normal range of motion. Neck supple. No thyroid mass and no thyromegaly present.  Cardiovascular: Normal rate, regular rhythm and normal heart sounds.   No murmur heard. Pulmonary/Chest: Effort normal and breath sounds normal. She has no wheezes.  Abdominal: Soft. Bowel sounds are normal. There is no tenderness.  Musculoskeletal: Normal range of motion.  Lymphadenopathy:    She has no cervical adenopathy.  Neurological: She is alert and oriented to person, place, and time. She has normal motor skills, normal sensation, normal strength and normal reflexes. Gait normal.  Skin: Skin is warm and dry.  Psychiatric: Mood, memory, affect and judgment normal.    Visual Acuity Screening   Right eye Left eye Both eyes  Without correction:     With correction: 20/25 20/30 20/25     Assessment and Plan :  Annual physical exam - anticipatory guidance  Essential hypertension - Plan: atenolol-chlorthalidone (TENORETIC)  50-25 MG tablet, TSH - continue medication -well controlled at this time  Screening for HIV (human immunodeficiency virus) - Plan: HIV antibody  Encounter for hepatitis C screening test for low risk patient - Plan: Hepatitis C antibody  Screening cholesterol level - Plan: Lipid panel  Screening for thyroid disorder - Plan: TSH  Screening for deficiency anemia - Plan: CBC with Differential/Platelet  Screening for metabolic disorder - Plan: COMPLETE METABOLIC PANEL WITH GFR  Chronic bilateral low back pain without sciatica - Plan: cyclobenzaprine (FLEXERIL) 5 MG  tablet - pt is currently not having problems - refill of flexeril was given as patient uses very sparingly  Special screening for malignant neoplasms, colon - Plan: POC Hemoccult Bld/Stl (3-Cd Home Screen) - pt declines colonoscopy but does agrees to do home stool cards.  Benny LennertSarah Weber PA-C  [facility] 05/17/2016 7:56 AM

## 2016-05-16 NOTE — Patient Instructions (Addendum)
Health Maintenance, Female Adopting a healthy lifestyle and getting preventive care can go a long way to promote health and wellness. Talk with your health care provider about what schedule of regular examinations is right for you. This is a good chance for you to check in with your provider about disease prevention and staying healthy. In between checkups, there are plenty of things you can do on your own. Experts have done a lot of research about which lifestyle changes and preventive measures are most likely to keep you healthy. Ask your health care provider for more information. WEIGHT AND DIET  Eat a healthy diet  Be sure to include plenty of vegetables, fruits, low-fat dairy products, and lean protein.  Do not eat a lot of foods high in solid fats, added sugars, or salt.  Get regular exercise. This is one of the most important things you can do for your health.  Most adults should exercise for at least 150 minutes each week. The exercise should increase your heart rate and make you sweat (moderate-intensity exercise).  Most adults should also do strengthening exercises at least twice a week. This is in addition to the moderate-intensity exercise.  Maintain a healthy weight  Body mass index (BMI) is a measurement that can be used to identify possible weight problems. It estimates body fat based on height and weight. Your health care provider can help determine your BMI and help you achieve or maintain a healthy weight.  For females 28 years of age and older:   A BMI below 18.5 is considered underweight.  A BMI of 18.5 to 24.9 is normal.  A BMI of 25 to 29.9 is considered overweight.  A BMI of 30 and above is considered obese.  Watch levels of cholesterol and blood lipids  You should start having your blood tested for lipids and cholesterol at 60 years of age, then have this test every 5 years.  You may need to have your cholesterol levels checked more often if:  Your lipid  or cholesterol levels are high.  You are older than 60 years of age.  You are at high risk for heart disease.  CANCER SCREENING   Lung Cancer  Lung cancer screening is recommended for adults 75-66 years old who are at high risk for lung cancer because of a history of smoking.  A yearly low-dose CT scan of the lungs is recommended for people who:  Currently smoke.  Have quit within the past 15 years.  Have at least a 30-pack-year history of smoking. A pack year is smoking an average of one pack of cigarettes a day for 1 year.  Yearly screening should continue until it has been 15 years since you quit.  Yearly screening should stop if you develop a health problem that would prevent you from having lung cancer treatment.  Breast Cancer  Practice breast self-awareness. This means understanding how your breasts normally appear and feel.  It also means doing regular breast self-exams. Let your health care provider know about any changes, no matter how small.  If you are in your 20s or 30s, you should have a clinical breast exam (CBE) by a health care provider every 1-3 years as part of a regular health exam.  If you are 25 or older, have a CBE every year. Also consider having a breast X-ray (mammogram) every year.  If you have a family history of breast cancer, talk to your health care provider about genetic screening.  If you  are at high risk for breast cancer, talk to your health care provider about having an MRI and a mammogram every year.  Breast cancer gene (BRCA) assessment is recommended for women who have family members with BRCA-related cancers. BRCA-related cancers include:  Breast.  Ovarian.  Tubal.  Peritoneal cancers.  Results of the assessment will determine the need for genetic counseling and BRCA1 and BRCA2 testing. Cervical Cancer Your health care provider may recommend that you be screened regularly for cancer of the pelvic organs (ovaries, uterus, and  vagina). This screening involves a pelvic examination, including checking for microscopic changes to the surface of your cervix (Pap test). You may be encouraged to have this screening done every 3 years, beginning at age 21.  For women ages 30-65, health care providers may recommend pelvic exams and Pap testing every 3 years, or they may recommend the Pap and pelvic exam, combined with testing for human papilloma virus (HPV), every 5 years. Some types of HPV increase your risk of cervical cancer. Testing for HPV may also be done on women of any age with unclear Pap test results.  Other health care providers may not recommend any screening for nonpregnant women who are considered low risk for pelvic cancer and who do not have symptoms. Ask your health care provider if a screening pelvic exam is right for you.  If you have had past treatment for cervical cancer or a condition that could lead to cancer, you need Pap tests and screening for cancer for at least 20 years after your treatment. If Pap tests have been discontinued, your risk factors (such as having a new sexual partner) need to be reassessed to determine if screening should resume. Some women have medical problems that increase the chance of getting cervical cancer. In these cases, your health care provider may recommend more frequent screening and Pap tests. Colorectal Cancer  This type of cancer can be detected and often prevented.  Routine colorectal cancer screening usually begins at 60 years of age and continues through 60 years of age.  Your health care provider may recommend screening at an earlier age if you have risk factors for colon cancer.  Your health care provider may also recommend using home test kits to check for hidden blood in the stool.  A small camera at the end of a tube can be used to examine your colon directly (sigmoidoscopy or colonoscopy). This is done to check for the earliest forms of colorectal  cancer.  Routine screening usually begins at age 50.  Direct examination of the colon should be repeated every 5-10 years through 60 years of age. However, you may need to be screened more often if early forms of precancerous polyps or small growths are found. Skin Cancer  Check your skin from head to toe regularly.  Tell your health care provider about any new moles or changes in moles, especially if there is a change in a mole's shape or color.  Also tell your health care provider if you have a mole that is larger than the size of a pencil eraser.  Always use sunscreen. Apply sunscreen liberally and repeatedly throughout the day.  Protect yourself by wearing long sleeves, pants, a wide-brimmed hat, and sunglasses whenever you are outside. HEART DISEASE, DIABETES, AND HIGH BLOOD PRESSURE   High blood pressure causes heart disease and increases the risk of stroke. High blood pressure is more likely to develop in:  People who have blood pressure in the high end   of the normal range (130-139/85-89 mm Hg).  People who are overweight or obese.  People who are African American.  If you are 38-23 years of age, have your blood pressure checked every 3-5 years. If you are 61 years of age or older, have your blood pressure checked every year. You should have your blood pressure measured twice--once when you are at a hospital or clinic, and once when you are not at a hospital or clinic. Record the average of the two measurements. To check your blood pressure when you are not at a hospital or clinic, you can use:  An automated blood pressure machine at a pharmacy.  A home blood pressure monitor.  If you are between 45 years and 39 years old, ask your health care provider if you should take aspirin to prevent strokes.  Have regular diabetes screenings. This involves taking a blood sample to check your fasting blood sugar level.  If you are at a normal weight and have a low risk for diabetes,  have this test once every three years after 60 years of age.  If you are overweight and have a high risk for diabetes, consider being tested at a younger age or more often. PREVENTING INFECTION  Hepatitis B  If you have a higher risk for hepatitis B, you should be screened for this virus. You are considered at high risk for hepatitis B if:  You were born in a country where hepatitis B is common. Ask your health care provider which countries are considered high risk.  Your parents were born in a high-risk country, and you have not been immunized against hepatitis B (hepatitis B vaccine).  You have HIV or AIDS.  You use needles to inject street drugs.  You live with someone who has hepatitis B.  You have had sex with someone who has hepatitis B.  You get hemodialysis treatment.  You take certain medicines for conditions, including cancer, organ transplantation, and autoimmune conditions. Hepatitis C  Blood testing is recommended for:  Everyone born from [birth year] through 1965.  Anyone with known risk factors for hepatitis C. Sexually transmitted infections (STIs)  You should be screened for sexually transmitted infections (STIs) including gonorrhea and chlamydia if:  You are sexually active and are younger than 60 years of age.  You are older than 60 years of age and your health care provider tells you that you are at risk for this type of infection.  Your sexual activity has changed since you were last screened and you are at an increased risk for chlamydia or gonorrhea. Ask your health care provider if you are at risk.  If you do not have HIV, but are at risk, it may be recommended that you take a prescription medicine daily to prevent HIV infection. This is called pre-exposure prophylaxis (PrEP). You are considered at risk if:  You are sexually active and do not regularly use condoms or know the HIV status of your partner(s).  You take drugs by injection.  You are sexually  active with a partner who has HIV. Talk with your health care provider about whether you are at high risk of being infected with HIV. If you choose to begin PrEP, you should first be tested for HIV. You should then be tested every 3 months for as long as you are taking PrEP.  PREGNANCY   If you are premenopausal and you may become pregnant, ask your health care provider about preconception counseling.  If you may  become pregnant, take 400 to 800 micrograms (mcg) of folic acid every day.  If you want to prevent pregnancy, talk to your health care provider about birth control (contraception). OSTEOPOROSIS AND MENOPAUSE   Osteoporosis is a disease in which the bones lose minerals and strength with aging. This can result in serious bone fractures. Your risk for osteoporosis can be identified using a bone density scan.  If you are 65 years of age or older, or if you are at risk for osteoporosis and fractures, ask your health care provider if you should be screened.  Ask your health care provider whether you should take a calcium or vitamin D supplement to lower your risk for osteoporosis.  Menopause may have certain physical symptoms and risks.  Hormone replacement therapy may reduce some of these symptoms and risks. Talk to your health care provider about whether hormone replacement therapy is right for you.  HOME CARE INSTRUCTIONS   Schedule regular health, dental, and eye exams.  Stay current with your immunizations.   Do not use any tobacco products including cigarettes, chewing tobacco, or electronic cigarettes.  If you are pregnant, do not drink alcohol.  If you are breastfeeding, limit how much and how often you drink alcohol.  Limit alcohol intake to no more than 1 drink per day for nonpregnant women. One drink equals 12 ounces of beer, 5 ounces of wine, or 1 ounces of hard liquor.  Do not use street drugs.  Do not share needles.  Ask your health care provider for help if  you need support or information about quitting drugs.  Tell your health care provider if you often feel depressed.  Tell your health care provider if you have ever been abused or do not feel safe at home.   This information is not intended to replace advice given to you by your health care provider. Make sure you discuss any questions you have with your health care provider.   Document Released: 01/27/2011 Document Revised: 08/04/2014 Document Reviewed: 06/15/2013 Elsevier Interactive Patient Education 2016 Elsevier Inc.    IF you received an x-ray today, you will receive an invoice from [facility]. Please contact [facility] at [phone] with questions or concerns regarding your invoice.   IF you received labwork today, you will receive an invoice from [facility]. Please contact [facility] at [phone] with questions or concerns regarding your invoice.   Our billing staff will not be able to assist you with questions regarding bills from these companies.  You will be contacted with the lab results as soon as they are available. The fastest way to get your results is to activate your My Chart account. Instructions are located on the last page of this paperwork. If you have not heard from us regarding the results in 2 weeks, please contact this office.     

## 2016-05-17 LAB — LIPID PANEL
CHOL/HDL RATIO: 2.7 ratio (ref <=5.0)
Cholesterol: 188 mg/dL (ref 125–200)
HDL: 70 mg/dL (ref >=46)
LDL Cholesterol: 88 mg/dL (ref <130)
Triglycerides: 149 mg/dL (ref <150)
VLDL: 30 mg/dL (ref <30)

## 2016-05-17 LAB — CBC WITH DIFFERENTIAL/PLATELET
BASOS ABS: 81 {cells}/uL (ref 0–200)
Basophils Relative: 1 %
EOS ABS: 243 {cells}/uL (ref 15–500)
EOS PCT: 3 %
HCT: 40 % (ref 35.0–45.0)
Hemoglobin: 12.6 g/dL (ref 11.7–15.5)
Lymphocytes Relative: 44 %
Lymphs Abs: 3564 cells/uL (ref 850–3900)
MCH: 24.9 pg — AB (ref 27.0–33.0)
MCHC: 31.5 g/dL — AB (ref 32.0–36.0)
MCV: 79.1 fL — AB (ref 80.0–100.0)
MONOS PCT: 6 %
MPV: 11.2 fL (ref 7.5–12.5)
Monocytes Absolute: 486 cells/uL (ref 200–950)
NEUTROS PCT: 46 %
Neutro Abs: 3726 cells/uL (ref 1500–7800)
PLATELETS: 250 10*3/uL (ref 140–400)
RBC: 5.06 MIL/uL (ref 3.80–5.10)
RDW: 14.4 % (ref 11.0–15.0)
WBC: 8.1 10*3/uL (ref 3.8–10.8)

## 2016-05-17 LAB — COMPLETE METABOLIC PANEL WITH GFR
ALK PHOS: 66 U/L (ref 33–130)
ALT: 25 U/L (ref 6–29)
AST: 30 U/L (ref 10–35)
Albumin: 4.6 g/dL (ref 3.6–5.1)
BILIRUBIN TOTAL: 0.5 mg/dL (ref 0.2–1.2)
BUN: 21 mg/dL (ref 7–25)
CO2: 27 mmol/L (ref 20–31)
Calcium: 9.5 mg/dL (ref 8.6–10.4)
Chloride: 101 mmol/L (ref 98–110)
Creat: 1.02 mg/dL (ref 0.50–1.05)
GFR, EST AFRICAN AMERICAN: 70 mL/min (ref >=60)
GFR, Est Non African American: 60 mL/min (ref >=60)
GLUCOSE: 50 mg/dL — AB (ref 65–99)
POTASSIUM: 5 mmol/L (ref 3.5–5.3)
SODIUM: 139 mmol/L (ref 135–146)
Total Protein: 7.8 g/dL (ref 6.1–8.1)

## 2016-05-17 LAB — HEPATITIS C ANTIBODY: HCV Ab: NEGATIVE

## 2016-05-17 LAB — TSH: TSH: 1.73 m[IU]/L

## 2016-05-17 LAB — HIV ANTIBODY (ROUTINE TESTING W REFLEX): HIV 1&2 Ab, 4th Generation: NONREACTIVE

## 2016-05-20 ENCOUNTER — Encounter: Payer: Self-pay | Admitting: Physician Assistant

## 2016-05-31 LAB — POC HEMOCCULT BLD/STL (HOME/3-CARD/SCREEN)
Card #2 Fecal Occult Blod, POC: NEGATIVE
FECAL OCCULT BLD: NEGATIVE
FECAL OCCULT BLD: NEGATIVE

## 2016-05-31 NOTE — Addendum Note (Signed)
Addended by: Isaac BlissGALLOWAY, Korri Ask J on: 05/31/2016 12:37 PM   Modules accepted: Orders

## 2016-06-02 ENCOUNTER — Other Ambulatory Visit: Payer: Self-pay | Admitting: Physician Assistant

## 2016-06-02 ENCOUNTER — Telehealth: Payer: Self-pay

## 2016-06-02 DIAGNOSIS — Z1231 Encounter for screening mammogram for malignant neoplasm of breast: Secondary | ICD-10-CM

## 2016-06-02 NOTE — Telephone Encounter (Signed)
Pt was seen Oct. 9, 2017 and she received [facility]. She called insurance and they stated that in order for that bill to be covered they will need the diagnostic code from that visit. That our doctor that she seen on that visit will need to call to give them that diagnostic code. I also told the patient to contact [facility] as well.  Please advise  [phone]

## 2016-06-06 DIAGNOSIS — M722 Plantar fascial fibromatosis: Secondary | ICD-10-CM | POA: Insufficient documentation

## 2016-06-06 NOTE — Telephone Encounter (Signed)
[facility]ed [facility], gave Chales AbrahamsMary Ann the ICD-10 numbers: Plantar fascitis  M72.2,  HTN I 10 They refiled with insurance.  Discussed with Whitney.  Attempted to call pt.  Unable to leave message.

## 2016-06-25 ENCOUNTER — Ambulatory Visit
Admission: RE | Admit: 2016-06-25 | Discharge: 2016-06-25 | Disposition: A | Discharge status: Home / Self Care | Payer: Commercial Managed Care - PPO | Source: Ambulatory Visit | Attending: Physician Assistant | Admitting: Physician Assistant

## 2016-06-25 DIAGNOSIS — Z1231 Encounter for screening mammogram for malignant neoplasm of breast: Secondary | ICD-10-CM

## 2016-12-05 ENCOUNTER — Ambulatory Visit (INDEPENDENT_AMBULATORY_CARE_PROVIDER_SITE_OTHER): Payer: Commercial Managed Care - PPO | Admitting: Physician Assistant

## 2016-12-05 ENCOUNTER — Encounter: Payer: Self-pay | Admitting: Physician Assistant

## 2016-12-05 VITALS — BP 127/82 | HR 99 | Temp 98.9°F | Resp 18 | Ht 64.5 in | Wt 189.4 lb

## 2016-12-05 DIAGNOSIS — M79671 Pain in right foot: Secondary | ICD-10-CM | POA: Diagnosis not present

## 2016-12-05 DIAGNOSIS — G8929 Other chronic pain: Secondary | ICD-10-CM | POA: Diagnosis not present

## 2016-12-05 DIAGNOSIS — M79672 Pain in left foot: Secondary | ICD-10-CM

## 2016-12-05 DIAGNOSIS — M545 Low back pain, unspecified: Secondary | ICD-10-CM

## 2016-12-05 DIAGNOSIS — I10 Essential (primary) hypertension: Secondary | ICD-10-CM

## 2016-12-05 MED ORDER — CYCLOBENZAPRINE HCL 5 MG PO TABS: 5.0000 mg | ORAL_TABLET | Freq: Three times a day (TID) | ORAL | 0 refills | Status: DC | PRN

## 2016-12-05 MED ORDER — ATENOLOL-CHLORTHALIDONE 50-25 MG PO TABS: ORAL_TABLET | ORAL | 1 refills | Status: DC

## 2016-12-05 NOTE — Patient Instructions (Addendum)
[employer] - inserts to helps with pes planus and plantar fasciitis Omega sports [employer] - my favorite   IF you received an x-ray today, you will receive an invoice from [facility]. Please contact [facility] at [phone] with questions or concerns regarding your invoice.   IF you received labwork today, you will receive an invoice from [facility]. Please contact [facility] at [phone] with questions or concerns regarding your invoice.   Our billing staff will not be able to assist you with questions regarding bills from these companies.  You will be contacted with the lab results as soon as they are available. The fastest way to get your results is to activate your My Chart account. Instructions are located on the last page of this paperwork. If you have not heard from us regarding the results in 2 weeks, please contact this office.      Plantar Fasciitis Plantar fasciitis is a painful foot condition that affects the heel. It occurs when the band of tissue that connects the toes to the heel bone (plantar fascia) becomes irritated. This can happen after exercising too much or doing other repetitive activities (overuse injury). The pain from plantar fasciitis can range from mild irritation to severe pain that makes it difficult for you to walk or move. The pain is usually worse in the morning or after you have been sitting or lying down for a while. What are the causes? This condition may be caused by:  Standing for long periods of time.  Wearing shoes that do not fit.  Doing high-impact activities, including running, aerobics, and ballet.  Being overweight.  Having an abnormal way of walking (gait).  Having tight calf muscles.  Having high arches in your feet.  Starting a new athletic activity. What are the signs or symptoms? The main symptom of this condition is heel pain. Other symptoms include:  Pain that gets worse after activity or  exercise.  Pain that is worse in the morning or after resting.  Pain that goes away after you walk for a few minutes. How is this diagnosed? This condition may be diagnosed based on your signs and symptoms. Your health care provider will also do a physical exam to check for:  A tender area on the bottom of your foot.  A high arch in your foot.  Pain when you move your foot.  Difficulty moving your foot. You may also need to have imaging studies to confirm the diagnosis. These can include:  X-rays.  Ultrasound.  MRI. How is this treated? Treatment for plantar fasciitis depends on the severity of the condition. Your treatment may include:  Rest, ice, and over-the-counter pain medicines to manage your pain.  Exercises to stretch your calves and your plantar fascia.  A splint that holds your foot in a stretched, upward position while you sleep (night splint).  Physical therapy to relieve symptoms and prevent problems in the future.  Cortisone injections to relieve severe pain.  Extracorporeal shock wave therapy (ESWT) to stimulate damaged plantar fascia with electrical impulses. It is often used as a last resort before surgery.  Surgery, if other treatments have not worked after 12 months. Follow these instructions at home:  Take medicines only as directed by your health care provider.  Avoid activities that cause pain.  Roll the bottom of your foot over a bag of ice or a bottle of cold water. Do this for 20 minutes, 3-4 times a day.  Perform simple stretches as directed  by your health care provider.  Try wearing athletic shoes with air-sole or gel-sole cushions or soft shoe inserts.  Wear a night splint while sleeping, if directed by your health care provider.  Keep all follow-up appointments with your health care provider. How is this prevented?  Do not perform exercises or activities that cause heel pain.  Consider finding low-impact activities if you continue  to have problems.  Lose weight if you need to. The best way to prevent plantar fasciitis is to avoid the activities that aggravate your plantar fascia. Contact a health care provider if:  Your symptoms do not go away after treatment with home care measures.  Your pain gets worse.  Your pain affects your ability to move or do your daily activities. This information is not intended to replace advice given to you by your health care provider. Make sure you discuss any questions you have with your health care provider. Document Released: 04/08/2001 Document Revised: 12/17/2015 Document Reviewed: 05/24/2014 Elsevier Interactive Patient Education  2017 [employer].

## 2016-12-05 NOTE — Progress Notes (Signed)
   Mandy Price  MRN: [id number] DOB: 25-Sep-1955  PCP: Morrell RiddleWeber, Anniya Whiters L, PA-C  Chief Complaint  Patient presents with  . Follow-up    6 month follow up on BP   . Foot Swelling    pt states she stands too long on them     Subjective:  Pt presents to clinic for HTn recheck.  She is doing good with her HTN medications.  She is not having any CP or SOB. She has been having some pain on the bottom of her feet that is worse in the am and then after she has been sitting.  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  She has tried some tylenol and motrin for the pain that it does help some.  Would also like a refill of her flexeril that she takes rarely when her back hurts after standing for long periods of time.  Review of Systems  Constitutional: Negative for chills and fever.  Respiratory: Negative for shortness of breath.   Cardiovascular: Negative for chest pain, palpitations and leg swelling.  Neurological: Negative for headaches.    Patient Active Problem List   Diagnosis Date Noted  . Plantar fasciitis 06/06/2016  . Colitis 07/06/2013  . Back pain, thoracic 04/28/2012  . Obesity (BMI 30.0-34.9) 04/28/2012  . HTN (hypertension) 01/22/2012    Current Outpatient Prescriptions on File Prior to Visit  Medication Sig Dispense Refill  . OMEGA-3 KRILL OIL PO Take by mouth daily.     No current facility-administered medications on file prior to visit.     No Known Allergies  Pt patients past, family and social history were reviewed and updated.   Objective:  BP 127/82   Pulse 99   Temp 98.9 F (37.2 C) (Oral)   Resp 18   Ht 5' 4.5" (1.638 m)   Wt 189 lb 6.4 oz (85.9 kg)   SpO2 100%   BMI 32.01 kg/m   Physical Exam  Constitutional: She is oriented to person, place, and time and well-developed, well-nourished, and in no distress.  HENT:  Head: Normocephalic and atraumatic.  Right Ear: Hearing and external ear  normal.  Left Ear: Hearing and external ear normal.  Eyes: Conjunctivae, EOM and lids are normal. Pupils are equal, round, and reactive to light.  Neck: Normal range of motion.  Cardiovascular: Normal rate, regular rhythm and normal heart sounds.   No murmur heard. Pulmonary/Chest: Effort normal and breath sounds normal. She has no wheezes.  Musculoskeletal:       Feet:  No swelling or other skin changes noted.  Pes planus bilaterally.  Neurological: She is alert and oriented to person, place, and time. She has normal sensation. Gait normal.  Skin: Skin is warm and dry.  Psychiatric: Mood, memory, affect and judgment normal.  Vitals reviewed.   Assessment and Plan :  Foot pain, bilateral -  Likely plantar fasciitis - d/w pt good support as she does have some pes planus bilaterally - ice water bottles massage for stretching would also be helpful - ok to use prn tylenol and or motrin for the pain.   Chronic bilateral low back pain without sciatica - Plan: cyclobenzaprine (FLEXERIL) 5 MG tablet  Essential hypertension - Plan: Basic metabolic panel, atenolol-chlorthalidone (TENORETIC) 50-25 MG tablet, Care order/instruction:     Benny LennertSarah Hampton Cost PA-C  [facility] 12/05/2016 8:20 PM

## 2016-12-06 LAB — BASIC METABOLIC PANEL
BUN/Creatinine Ratio: 19 (ref 12–28)
BUN: 17 mg/dL (ref 8–27)
CO2: 24 mmol/L (ref 18–29)
Calcium: 9.7 mg/dL (ref 8.7–10.3)
Chloride: 99 mmol/L (ref 96–106)
Creatinine, Ser: 0.91 mg/dL (ref 0.57–1.00)
GFR calc Af Amer: 79 mL/min/{1.73_m2} (ref >59)
GFR, EST NON AFRICAN AMERICAN: 69 mL/min/{1.73_m2} (ref >59)
Glucose: 83 mg/dL (ref 65–99)
Potassium: 3.9 mmol/L (ref 3.5–5.2)
SODIUM: 141 mmol/L (ref 134–144)

## 2016-12-09 ENCOUNTER — Encounter: Payer: Self-pay | Admitting: Radiology

## 2017-01-02 ENCOUNTER — Ambulatory Visit (INDEPENDENT_AMBULATORY_CARE_PROVIDER_SITE_OTHER): Payer: Commercial Managed Care - PPO | Admitting: Physician Assistant

## 2017-01-02 ENCOUNTER — Encounter: Payer: Self-pay | Admitting: Physician Assistant

## 2017-01-02 ENCOUNTER — Ambulatory Visit (INDEPENDENT_AMBULATORY_CARE_PROVIDER_SITE_OTHER): Payer: Commercial Managed Care - PPO

## 2017-01-02 VITALS — BP 111/73 | HR 103 | Temp 99.0°F | Resp 17 | Ht 65.0 in | Wt 189.0 lb

## 2017-01-02 DIAGNOSIS — R000 Tachycardia, unspecified: Secondary | ICD-10-CM | POA: Diagnosis not present

## 2017-01-02 DIAGNOSIS — I4891 Unspecified atrial fibrillation: Secondary | ICD-10-CM

## 2017-01-02 DIAGNOSIS — N390 Urinary tract infection, site not specified: Secondary | ICD-10-CM

## 2017-01-02 DIAGNOSIS — R0981 Nasal congestion: Secondary | ICD-10-CM | POA: Diagnosis not present

## 2017-01-02 DIAGNOSIS — R05 Cough: Secondary | ICD-10-CM | POA: Diagnosis not present

## 2017-01-02 DIAGNOSIS — R059 Cough, unspecified: Secondary | ICD-10-CM

## 2017-01-02 MED ORDER — RIVAROXABAN 20 MG PO TABS: 20.0000 mg | ORAL_TABLET | Freq: Every day | ORAL | 0 refills | Status: DC

## 2017-01-02 MED ORDER — ATENOLOL-CHLORTHALIDONE 100-25 MG PO TABS: 1.0000 | ORAL_TABLET | Freq: Every day | ORAL | 1 refills | Status: DC

## 2017-01-02 MED ORDER — GUAIFENESIN ER 1200 MG PO TB12: 1.0000 | ORAL_TABLET | Freq: Two times a day (BID) | ORAL | 1 refills | Status: DC | PRN

## 2017-01-02 MED ORDER — BENZONATATE 100 MG PO CAPS
100.0000 mg | ORAL_CAPSULE | Freq: Three times a day (TID) | ORAL | 0 refills | Status: DC | PRN
Start: 2017-01-02 — End: 2017-01-05

## 2017-01-02 MED ORDER — FLUTICASONE PROPIONATE 50 MCG/ACT NA SUSP: 2.0000 | Freq: Every day | NASAL | 0 refills | Status: DC

## 2017-01-02 MED ORDER — BENZONATATE 100 MG PO CAPS: 100.0000 mg | ORAL_CAPSULE | Freq: Three times a day (TID) | ORAL | 0 refills | Status: DC | PRN

## 2017-01-02 NOTE — Patient Instructions (Addendum)
- We will treat this as a respiratory viral infection.  - I recommend you rest, drink plenty of fluids, eat light meals including soups.  - You may use Tessalon pearls and mucinex during the day the cough and flonase daily for the nasal congestion.  - Please let me know if you are not seeing any improvement or get worse in 4-5 days.    In terms of your irregular heart beat, we are going to place you on a blood thinner to reduce your risk for stroke and also increase your HTN medication to better control your heart rate. Please check your heart rate over the next few days with this medication chan  ge. Your heart rate goal is 60-90 bpm. Your bp goal is <140/90 and >100/60.  I have placed a referral to cardiology and you will be seen by Dr. Nadara Eaton in the next two weeks. Please contact our office if you have any questions in the meantime. Thank you for letting me participate in your health and well being.    Atrial Fibrillation Atrial fibrillation is a type of heartbeat that is irregular or fast (rapid). If you have this condition, your heart keeps quivering in a weird (chaotic) way. This condition can make it so your heart cannot pump blood normally. Having this condition gives a person more risk for stroke, heart failure, and other heart problems. There are different types of atrial fibrillation. Talk with your doctor to learn about the type that you have. Follow these instructions at home:  Take over-the-counter and prescription medicines only as told by your doctor.  If your doctor prescribed a blood-thinning medicine, take it exactly as told. Taking too much of it can cause bleeding. If you do not take enough of it, you will not have the protection that you need against stroke and other problems.  Do not use any tobacco products. These include cigarettes, chewing tobacco, and e-cigarettes. If you need help quitting, ask your doctor.  If you have apnea (obstructive sleep apnea), manage it as told  by your doctor.  Do not drink alcohol.  Do not drink beverages that have caffeine. These include coffee, soda, and tea.  Maintain a healthy weight. Do not use diet pills unless your doctor says they are safe for you. Diet pills may make heart problems worse.  Follow diet instructions as told by your doctor.  Exercise regularly as told by your doctor.  Keep all follow-up visits as told by your doctor. This is important. Contact a doctor if:  You notice a change in the speed, rhythm, or strength of your heartbeat.  You are taking a blood-thinning medicine and you notice more bruising.  You get tired more easily when you move or exercise. Get help right away if:  You have pain in your chest or your belly (abdomen).  You have sweating or weakness.  You feel sick to your stomach (nauseous).  You notice blood in your throw up (vomit), poop (stool), or pee (urine).  You are short of breath.  You suddenly have swollen feet and ankles.  You feel dizzy.  Your suddenly get weak or numb in your face, arms, or legs, especially if it happens on one side of your body.  You have trouble talking, trouble understanding, or both.  Your face or your eyelid droops on one side. These symptoms may be an emergency. Do not wait to see if the symptoms will go away. Get medical help right away. Call your local  emergency services (911 in the [place] Do not drive yourself to the hospital. This information is not intended to replace advice given to you by your health care provider. Make sure you discuss any questions you have with your health care provider. Document Released: 04/22/2008 Document Revised: 12/20/2015 Document Reviewed: 11/08/2014 Elsevier Interactive Patient Education  2018 [employer]   Rivaroxaban oral tablets What is this medicine? RIVAROXABAN (ri va ROX a ban) is an anticoagulant (blood thinner). It is used to treat blood clots in the lungs or in the veins. It is also used  after knee or hip surgeries to prevent blood clots. It is also used to lower the chance of stroke in people with a medical condition called atrial fibrillation. This medicine may be used for other purposes; ask your health care provider or pharmacist if you have questions. COMMON BRAND NAME(S): Xarelto, Xarelto Starter Pack What should I tell my health care provider before I take this medicine? They need to know if you have any of these conditions: -bleeding disorders -bleeding in the brain -blood in your stools (black or tarry stools) or if you have blood in your vomit -history of stomach bleeding -kidney disease -liver disease -low blood counts, like low white cell, platelet, or red cell counts -recent or planned spinal or epidural procedure -take medicines that treat or prevent blood clots -an unusual or allergic reaction to rivaroxaban, other medicines, foods, dyes, or preservatives -pregnant or trying to get pregnant -breast-feeding How should I use this medicine? Take this medicine by mouth with a glass of water. Follow the directions on the prescription label. Take your medicine at regular intervals. Do not take it more often than directed. Do not stop taking except on your doctor's advice. Stopping this medicine may increase your risk of a blood clot. Be sure to refill your prescription before you run out of medicine. If you are taking this medicine after hip or knee replacement surgery, take it with or without food. If you are taking this medicine for atrial fibrillation, take it with your evening meal. If you are taking this medicine to treat blood clots, take it with food at the same time each day. If you are unable to swallow your tablet, you may crush the tablet and mix it in applesauce. Then, immediately eat the applesauce. You should eat more food right after you eat the applesauce containing the crushed tablet. Talk to your pediatrician regarding the use of this medicine in  children. Special care may be needed. Overdosage: If you think you have taken too much of this medicine contact a poison control center or emergency room at once. NOTE: This medicine is only for you. Do not share this medicine with others. What if I miss a dose? If you take your medicine once a day and miss a dose, take the missed dose as soon as you remember. If you take your medicine twice a day and miss a dose, take the missed dose immediately. In this instance, 2 tablets may be taken at the same time. The next day you should take 1 tablet twice a day as directed. What may interact with this medicine? Do not take this medicine with any of the following medications: -defibrotide This medicine may also interact with the following medications: -aspirin and aspirin-like medicines -certain antibiotics like erythromycin, azithromycin, and clarithromycin -certain medicines for fungal infections like ketoconazole and itraconazole -certain medicines for irregular heart beat like amiodarone, quinidine, dronedarone -certain medicines for seizures like carbamazepine, phenytoin -  certain medicines that treat or prevent blood clots like warfarin, enoxaparin, and dalteparin -conivaptan -diltiazem -felodipine -indinavir -lopinavir; ritonavir -NSAIDS, medicines for pain and inflammation, like ibuprofen or naproxen -ranolazine -rifampin -ritonavir -SNRIs, medicines for depression, like desvenlafaxine, duloxetine, levomilnacipran, venlafaxine -SSRIs, medicines for depression, like citalopram, escitalopram, fluoxetine, fluvoxamine, paroxetine, sertraline -St. John's wort -verapamil This list may not describe all possible interactions. Give your health care provider a list of all the medicines, herbs, non-prescription drugs, or dietary supplements you use. Also tell them if you smoke, drink alcohol, or use illegal drugs. Some items may interact with your medicine. What should I watch for while using this  medicine? Visit your doctor or health care professional for regular checks on your progress. Notify your doctor or health care professional and seek emergency treatment if you develop breathing problems; changes in vision; chest pain; severe, sudden headache; pain, swelling, warmth in the leg; trouble speaking; sudden numbness or weakness of the face, arm or leg. These can be signs that your condition has gotten worse. If you are going to have surgery or other procedure, tell your doctor that you are taking this medicine. What side effects may I notice from receiving this medicine? Side effects that you should report to your doctor or health care professional as soon as possible: -allergic reactions like skin rash, itching or hives, swelling of the face, lips, or tongue -back pain -redness, blistering, peeling or loosening of the skin, including inside the mouth -signs and symptoms of bleeding such as bloody or black, tarry stools; red or dark-brown urine; spitting up blood or brown material that looks like coffee grounds; red spots on the skin; unusual bruising or bleeding from the eye, gums, or nose Side effects that usually do not require medical attention (report to your doctor or health care professional if they continue or are bothersome): -dizziness -muscle pain This list may not describe all possible side effects. Call your doctor for medical advice about side effects. You may report side effects to FDA at 1-800-FDA-1088. Where should I keep my medicine? Keep out of the reach of children. Store at room temperature between 15 and 30 degrees C (59 and 86 degrees F). Throw away any unused medicine after the expiration date. NOTE: This sheet is a summary. It may not cover all possible information. If you have questions about this medicine, talk to your doctor, pharmacist, or health care provider.  2018 Elsevier/Gold Standard (2016-04-02 16:29:33)   IF you received an x-ray today, you will  receive an invoice from [facility]. Please contact [facility] at [phone] with questions or concerns regarding your invoice.   IF you received labwork today, you will receive an invoice from [place]. Please contact [facility] at [phone] with questions or concerns regarding your invoice.   Our billing staff will not be able to assist you with questions regarding bills from these companies.  You will be contacted with the lab results as soon as they are available. The fastest way to get your results is to activate your My Chart account. Instructions are located on the last page of this paperwork. If you have not heard from [place] regarding the results in 2 weeks, please contact this office.

## 2017-01-02 NOTE — Progress Notes (Signed)
MRN: [id number] DOB: 14-Oct-1955  Subjective:   Mandy Price is a 61 y.o. female presenting for chief complaint of URI and Nasal Congestion .  Reports 2 day history of rhinorrhea, productive cough (no hemoptysis) and sneezing. Has not tried anything relief. Denies fever, sinus pain, ear fullness, sore throat, wheezing and chest tightness, fatigue, nausea, vomiting, abdominal pain and diarrhea. Has hot had sick contact with anyone. No history of seasonal allergies, no history of asthma. Denies smoking or alcohol use. Denies any other aggravating or relieving factors, no other questions or concerns.  Review of Systems  Cardiovascular: Negative for chest pain, palpitations and leg swelling.  Neurological: Negative for dizziness and headaches.   Mandy Price has a current medication list which includes the following prescription(s): atenolol-chlorthalidone, cyclobenzaprine, and krill oil. Also has No Known Allergies.  Mandy Price  has a past medical history of Hypertension. Also  has no past surgical history on file.   Objective:   Vitals: BP 111/73   Pulse (!) 103   Temp 99 F (37.2 C) (Oral)   Resp 17   Ht 5\' 5"  (1.651 m)   Wt 189 lb (85.7 kg)   SpO2 98%   BMI 31.45 kg/m   Physical Exam  Constitutional: She is oriented to person, place, and time. She appears well-developed and well-nourished.  HENT:  Head: Normocephalic and atraumatic.  Right Ear: Tympanic membrane, external ear and ear canal normal.  Left Ear: Tympanic membrane, external ear and ear canal normal.  Nose: Mucosal edema (mild bilaterally) present. Right sinus exhibits no maxillary sinus tenderness and no frontal sinus tenderness. Left sinus exhibits no maxillary sinus tenderness and no frontal sinus tenderness.  Mouth/Throat: Uvula is midline, oropharynx is clear and moist and mucous membranes are normal. Tonsils are 1+ on the right.  Eyes: Conjunctivae are normal.  Neck: Normal range of motion.  Cardiovascular:  Normal heart sounds and normal pulses.  An irregular rhythm present. Tachycardia present.   Pulmonary/Chest: Effort normal and breath sounds normal. She has no wheezes. She has no rhonchi. She has no rales.  Lymphadenopathy:       Head (right side): No submental, no submandibular, no tonsillar, no preauricular, no posterior auricular and no occipital adenopathy present.       Head (left side): No submental, no submandibular, no tonsillar, no preauricular, no posterior auricular and no occipital adenopathy present.    She has no cervical adenopathy.       Right: No supraclavicular adenopathy present.       Left: No supraclavicular adenopathy present.  Neurological: She is alert and oriented to person, place, and time.  Skin: Skin is warm and dry.  Psychiatric: She has a normal mood and affect.  Vitals reviewed.   No results found for this or any previous visit (from the past 24 hour(s)).   Dg Chest 2 View  Result Date: 01/02/2017 CLINICAL DATA:  Cough for 2 days.  New onset atrial fibrillation EXAM: CHEST  2 VIEW COMPARISON:  July 04, 2013 FINDINGS: Lungs are clear. Heart size and pulmonary vascularity are normal. No adenopathy. There is mild degenerative change in the thoracic spine. IMPRESSION: No edema or consolidation. Electronically Signed   By: Bretta BangWilliam  Woodruff III M.D.   On: 01/02/2017 16:30   EKG shows irregularly irregular rhythm at 108 bpm, which was not present on most prior EKG on 02/01/2014. EKG presented to and discussed with cardiologist, Dr. Jacinto HalimGanji.   CHADSVASC score of 2.  Assessment and Plan :  This case was precepted with both Dr. Katrinka Blazing and Dr. Jacinto Halim.   1. Tachycardia 2. Atrial fibrillation, unspecified type (HCC) -Incidental findings today of new onset a fib of unknown duration, she is asymptomatic at this time. Dr. Jacinto Halim has been consulted and pt's case was discussed, He has reviewed the EKG and agrees that pt is stable at this time. He recommended starting her on  Xarelto and increasing her atenolol from 50mg  to 100mg . Encouraged pt to check heart rate and bp outside of the office, goal is 60-90 bpm. BP goal is <140/90 and >100/60. Dr. Jacinto Halim would like to see pt in his office within the next two weeks. His office will call her to schedule that appointment.  She was informed of the risks and benefits of Xarelto and agrees to proceed with treatment. Given strict ED precautions.  - EKG 12-Lead - DG Chest 2 View; Future - atenolol-chlorthalidone (TENORETIC) 100-25 MG tablet; Take 1 tablet by mouth daily.  Dispense: 90 tablet; Refill: 1 - Ambulatory referral to Cardiology - rivaroxaban (XARELTO) 20 MG TABS tablet; Take 1 tablet (20 mg total) by mouth daily with supper.  Dispense: 90 tablet; Refill: 0  3. Cough 4. Nasal congestion 5. Acute upper urinary tract infection PE findings and plain films are reassuring. This is likely a viral etiology. Will treat symptomatically at this time. Instructed to return to clinic if symptoms worsen, do not improve in 4-5 days, or as needed - benzonatate (TESSALON) 100 MG capsule; Take 1-2 capsules (100-200 mg total) by mouth 3 (three) times daily as needed for cough.  Dispense: 40 capsule; Refill: 0 - Guaifenesin (MUCINEX MAXIMUM STRENGTH) 1200 MG TB12; Take 1 tablet (1,200 mg total) by mouth every 12 (twelve) hours as needed.  Dispense: 14 tablet; Refill: 1 -fluticasone (FLONASE) 50 MCG/ACT nasal spray; Place 2 sprays into both nostrils daily.  Dispense: 16 g; Refill: 0  Benjiman Core, PA-C  Primary Care at [facility] 01/03/2017 1:43 PM

## 2017-01-05 ENCOUNTER — Ambulatory Visit (INDEPENDENT_AMBULATORY_CARE_PROVIDER_SITE_OTHER): Payer: Commercial Managed Care - PPO | Admitting: Physician Assistant

## 2017-01-05 ENCOUNTER — Telehealth: Payer: Self-pay

## 2017-01-05 ENCOUNTER — Encounter: Payer: Self-pay | Admitting: Physician Assistant

## 2017-01-05 VITALS — BP 126/78 | HR 100 | Temp 98.8°F | Resp 18 | Ht 65.0 in | Wt 188.6 lb

## 2017-01-05 DIAGNOSIS — R000 Tachycardia, unspecified: Secondary | ICD-10-CM

## 2017-01-05 DIAGNOSIS — I4891 Unspecified atrial fibrillation: Secondary | ICD-10-CM | POA: Diagnosis not present

## 2017-01-05 DIAGNOSIS — I10 Essential (primary) hypertension: Secondary | ICD-10-CM | POA: Diagnosis not present

## 2017-01-05 MED ORDER — ATENOLOL 50 MG PO TABS: 50.0000 mg | ORAL_TABLET | Freq: Every day | ORAL | 0 refills | Status: DC

## 2017-01-05 MED ORDER — ATENOLOL-CHLORTHALIDONE 100-25 MG PO TABS: 1.0000 | ORAL_TABLET | Freq: Every day | ORAL | 1 refills | Status: DC

## 2017-01-05 NOTE — Patient Instructions (Addendum)
  For now - stat aspirin 325mg  until we can figure out what your insurance wants to do with the blood thinner medications.  For now -  Atenolol-chlorthalidone 50/25mg  (you have this at home) add - atenolol 50mg  (this is at [facility])  When you get the new [facility] medication - this will be for Atenolol -chlorthalidone 100/25mg  - you will start this and stop the 2 medications above   IF you received an x-ray today, you will receive an invoice from [facility]. Please contact [facility] at [phone] with questions or concerns regarding your invoice.   IF you received labwork today, you will receive an invoice from [facility]. Please contact [facility] at [phone] with questions or concerns regarding your invoice.   Our billing staff will not be able to assist you with questions regarding bills from these companies.  You will be contacted with the lab results as soon as they are available. The fastest way to get your results is to activate your My Chart account. Instructions are located on the last page of this paperwork. If you have not heard from us regarding the results in 2 weeks, please contact this office.

## 2017-01-05 NOTE — Progress Notes (Signed)
Mandy Price  MRN: [id number] DOB: 1956/03/15  PCP: Morrell RiddleWeber, Abygale Karpf L, PA-C  Chief Complaint  Patient presents with  . Medication Problem    would like to discuss tenoretic and flexeril     Subjective:  Pt presents to clinic for discussion of her last visit in regards to change in medications and new medications.  She is concerned because her insurance will not cover the medications and she is not sure what she is supposed to do.  She feels fine.  Dayquil -- prior to onset of a fib for several doses the day before for a cold which is not resolved.  Review of Systems  Constitutional: Negative for chills and fever.  Respiratory: Negative for cough and shortness of breath.   Cardiovascular: Negative for chest pain, palpitations and leg swelling.    Patient Active Problem List   Diagnosis Date Noted  . Plantar fasciitis 06/06/2016  . Colitis 07/06/2013  . Back pain, thoracic 04/28/2012  . Obesity (BMI 30.0-34.9) 04/28/2012  . HTN (hypertension) 01/22/2012    Current Outpatient Prescriptions on File Prior to Visit  Medication Sig Dispense Refill  . cyclobenzaprine (FLEXERIL) 5 MG tablet Take 1 tablet (5 mg total) by mouth 3 (three) times daily as needed for muscle spasms. 90 tablet 0  . OMEGA-3 KRILL OIL PO Take by mouth daily.    . rivaroxaban (XARELTO) 20 MG TABS tablet Take 1 tablet (20 mg total) by mouth daily with supper. (Patient not taking: Reported on 01/05/2017) 90 tablet 0   No current facility-administered medications on file prior to visit.     No Known Allergies  Pt patients past, family and social history were reviewed and updated.   Objective:  BP 126/78   Pulse 100   Temp 98.8 F (37.1 C) (Oral)   Resp 18   Ht 5\' 5"  (1.651 m)   Wt 188 lb 9.6 oz (85.5 kg)   SpO2 100%   BMI 31.38 kg/m   Physical Exam  Constitutional: She is oriented to person, place, and time and well-developed, well-nourished, and in no distress.  HENT:  Head: Normocephalic and  atraumatic.  Right Ear: Hearing and external ear normal.  Left Ear: Hearing and external ear normal.  Eyes: Conjunctivae are normal.  Neck: Normal range of motion.  Cardiovascular: Normal heart sounds.  An irregularly irregular rhythm present.  No murmur heard. Pulmonary/Chest: Effort normal and breath sounds normal. She has no wheezes.  Musculoskeletal:       Right lower leg: She exhibits no edema.       Left lower leg: She exhibits no edema.  Neurological: She is alert and oriented to person, place, and time. Gait normal.  Skin: Skin is warm and dry.  Psychiatric: Mood, memory, affect and judgment normal.  Vitals reviewed.   Assessment and Plan :  Atrial fibrillation, unspecified type (HCC) - Plan: atenolol (TENORMIN) 50 MG tablet, atenolol-chlorthalidone (TENORETIC) 100-25 MG tablet, Care order/instruction: -   Tachycardia - Plan: atenolol-chlorthalidone (TENORETIC) 100-25 MG tablet  Hypertension, unspecified type - Plan: atenolol (TENORMIN) 50 MG tablet, atenolol-chlorthalidone (TENORETIC) 100-25 MG tablet  D/w pt that she is in a fib as seen in the EKG and hear on exam also today - we discussed the concern of untreated a fib and why her medications were changed.  Her Alvie HeidelbergXaraleto is under going prior authorization so she will start ASA 325mg  until she hears from me about that medication. She will continue to take her Atenolol/chlorthalidone 50/25 for now  and add atenolol 50mg  to that until her new Rx for Atenolol chlorthalidone 100/25 comes from the mail order.  She has not yet heard about cardiology appt.  She was instructed to no long use OTC cold preps.  Benny Lennert PA-C  Primary Care at [facility] 01/05/2017 4:10 PM

## 2017-01-05 NOTE — Telephone Encounter (Signed)
PA Started Xarelto Form placed in box Please sign and fax to [phone], or return to me ([email or web address]) to fax

## 2017-01-06 NOTE — Telephone Encounter (Signed)
I have signed the form and placed it in the "to be faxed" box. Thanks!

## 2017-01-08 ENCOUNTER — Telehealth: Payer: Self-pay

## 2017-01-08 NOTE — Telephone Encounter (Signed)
Please call the patient and let her know that the medication to prevent blood clots has been approved and is at her pharmacy - she will start this mediation and stop the ASA

## 2017-01-08 NOTE — Telephone Encounter (Signed)
PA Xarleto APPROVED Coverage Dates 12/09/16 through 01/08/18

## 2017-01-09 ENCOUNTER — Telehealth: Payer: Self-pay

## 2017-01-09 NOTE — Telephone Encounter (Signed)
Left message on pt voicemail to NOT Take aspirin, since she has started Xarelto.

## 2017-01-27 ENCOUNTER — Telehealth: Payer: Self-pay

## 2017-01-27 NOTE — Telephone Encounter (Signed)
PA request for xarelto faxed to [facility]. Confirmation fax received.

## 2017-03-09 DIAGNOSIS — I4891 Unspecified atrial fibrillation: Secondary | ICD-10-CM | POA: Insufficient documentation

## 2017-04-22 ENCOUNTER — Encounter: Payer: Self-pay | Admitting: Cardiology

## 2017-05-27 ENCOUNTER — Other Ambulatory Visit: Payer: Self-pay | Admitting: Physician Assistant

## 2017-05-27 DIAGNOSIS — Z1231 Encounter for screening mammogram for malignant neoplasm of breast: Secondary | ICD-10-CM

## 2017-06-26 ENCOUNTER — Ambulatory Visit: Payer: Commercial Managed Care - PPO

## 2017-07-27 ENCOUNTER — Ambulatory Visit
Admission: RE | Admit: 2017-07-27 | Discharge: 2017-07-27 | Disposition: A | Discharge status: Home / Self Care | Payer: Commercial Managed Care - PPO | Source: Ambulatory Visit | Attending: Physician Assistant | Admitting: Physician Assistant

## 2017-07-27 DIAGNOSIS — Z1231 Encounter for screening mammogram for malignant neoplasm of breast: Secondary | ICD-10-CM

## 2017-08-08 ENCOUNTER — Encounter: Payer: Self-pay | Admitting: Physician Assistant

## 2017-08-08 ENCOUNTER — Ambulatory Visit: Payer: Commercial Managed Care - PPO | Admitting: Family Medicine

## 2017-08-08 VITALS — BP 134/83 | HR 80 | Temp 98.6°F | Resp 16 | Ht 65.0 in | Wt 184.0 lb

## 2017-08-08 DIAGNOSIS — M7918 Myalgia, other site: Secondary | ICD-10-CM

## 2017-08-08 DIAGNOSIS — I4891 Unspecified atrial fibrillation: Secondary | ICD-10-CM

## 2017-08-08 DIAGNOSIS — Z5181 Encounter for therapeutic drug level monitoring: Secondary | ICD-10-CM

## 2017-08-08 DIAGNOSIS — I10 Essential (primary) hypertension: Secondary | ICD-10-CM | POA: Diagnosis not present

## 2017-08-08 DIAGNOSIS — R000 Tachycardia, unspecified: Secondary | ICD-10-CM

## 2017-08-08 LAB — POCT URINALYSIS DIP (MANUAL ENTRY)
BILIRUBIN UA: NEGATIVE mg/dL
Bilirubin, UA: NEGATIVE
Glucose, UA: NEGATIVE mg/dL
Nitrite, UA: NEGATIVE
PH UA: 7 (ref 5.0–8.0)
Protein Ur, POC: NEGATIVE mg/dL
SPEC GRAV UA: 1.015 (ref 1.010–1.025)
Urobilinogen, UA: 0.2 E.U./dL

## 2017-08-08 LAB — POC MICROSCOPIC URINALYSIS (UMFC): Mucus: ABSENT

## 2017-08-08 MED ORDER — ATENOLOL-CHLORTHALIDONE 100-25 MG PO TABS: 1.0000 | ORAL_TABLET | Freq: Every day | ORAL | 1 refills | Status: DC

## 2017-08-08 MED ORDER — CYCLOBENZAPRINE HCL 5 MG PO TABS: 5.0000 mg | ORAL_TABLET | Freq: Three times a day (TID) | ORAL | 0 refills | Status: DC | PRN

## 2017-08-08 NOTE — Progress Notes (Signed)
Subjective:  By signing my name below, I, Mandy Price, attest that this documentation has been prepared under the direction and in the presence of Mandy Sorenson, MD. Electronically Signed: Stann Price, Scribe. 08/08/2017 , 12:58 PM .  Patient was seen in Room 3 .   Patient ID: Mandy Price, female    DOB: Jul 11, 1956, 62 y.o.   MRN: [id number] Chief Complaint  Patient presents with  . Medication Refill    Atenolol, Flexeril, Patient not taking Xarelto because of bleeding during teeth cleaning  . Back Pain   HPI Mandy Price is a 62 y.o. female who presents to Primary Care at [facility] for medication refill and also complains of back pain. Patient was last seen 6 months previously, with diagnosis of a-fib on atenolol for rate control. She was referred to Mandy Price, and started on Xarelto. Her atenolol was increased from 50mg  to 100mg  at her last visit.   Patient states she saw Mandy Price and had echocardiogram done; informs looked good without blood clots. She hasn't taken Xarelto due to concern with bleeding in her gums due to dental work. She still has another procedure coming up. So, she's been taking aspirin 81mg  instead of Xarelto. She denies history of heart disease. Her CHADS vascular score is 2, which is high risk. She denies swelling in her legs.   She mentions having some back pain. 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 She is right hand dominant.   Past Medical History:  Diagnosis Date  . Hypertension    No past surgical history on file. Prior to Admission medications   Medication Sig Start Date End Date Taking? Authorizing Provider  atenolol-chlorthalidone (TENORETIC) 100-25 MG tablet Take 1 tablet by mouth daily. 01/05/17  Yes Price, Mandy L, PA-C  cyclobenzaprine (FLEXERIL) 5 MG tablet Take 1 tablet (5 mg total) by mouth 3 (three) times daily as needed for muscle spasms. 12/05/16  Yes Price, Mandy L, PA-C  OMEGA-3 KRILL OIL PO Take by mouth daily.   Yes [provider]  rivaroxaban (XARELTO) 20 MG TABS tablet Take 1 tablet (20 mg total) by mouth daily with supper. Patient not taking: Reported on 01/05/2017 01/02/17   Mandy Price D, PA-C   No Known Allergies No family history on file. Social History   Socioeconomic History  . Marital status: Married    Spouse name: None  . Number of children: None  . Years of education: None  . Highest education level: None  Social Needs  . Financial resource strain: None  . Food insecurity - worry: None  . Food insecurity - inability: None  . Transportation needs - medical: None  . Transportation needs - non-medical: None  Occupational History  . None  Tobacco Use  . Smoking status: Never Smoker  . Smokeless tobacco: Never Used  Substance and Sexual Activity  . Alcohol use: No  . Drug use: No  . Sexual activity: Yes    Partners: Male  Other Topics Concern  . None  Social History Narrative   Married   4 grown children   [employer]   Depression screen [facility] 2/9 08/08/2017 01/05/2017 01/02/2017 12/05/2016 05/16/2016  Decreased Interest 0 0 0 0 0  Down, Depressed, Hopeless 0 0 0 0 0  PHQ - 2 Score 0 0 0 0 0    Review of Systems  Constitutional: Negative for chills, fatigue, fever and unexpected weight change.  Respiratory: Negative for cough.   Gastrointestinal: Negative for constipation, diarrhea,  nausea and vomiting.  Musculoskeletal: Positive for back pain.  Skin: Negative for rash and wound.  Neurological: Negative for dizziness, weakness and headaches.       Objective:   Physical Exam  Constitutional: She is oriented to person, place, and time. She appears well-developed and well-nourished. No distress.  HENT:  Head: Normocephalic and atraumatic.  Eyes: EOM are normal. Pupils are equal, round, and reactive to light.  Neck: Neck supple.  Cardiovascular: Normal rate, regular rhythm and normal heart sounds.  No murmur heard. Pulmonary/Chest: Effort normal and breath sounds normal. No  respiratory distress.  Musculoskeletal: Normal range of motion.  Describes area of pain over right thoracic rhomboid surrounding her scapula  Neurological: She is alert and oriented to person, place, and time.  Skin: Skin is warm and dry.  Psychiatric: She has a normal mood and affect. Her behavior is normal.  Nursing note and vitals reviewed.   BP 134/83   Pulse 80   Temp 98.6 F (37 C) (Oral)   Resp 16   Ht 5\' 5"  (1.651 m)   Wt 184 lb (83.5 kg)   SpO2 100%   BMI 30.62 kg/m   Results for orders placed or performed in visit on 08/08/17  POCT urinalysis dipstick  Result Value Ref Range   Color, UA yellow yellow   Clarity, UA cloudy (A) clear   Glucose, UA negative negative mg/dL   Bilirubin, UA negative negative   Ketones, POC UA negative negative mg/dL   Spec Grav, UA 1.610 9.604 - 1.025   Blood, UA trace-lysed (A) negative   pH, UA 7.0 5.0 - 8.0   Protein Ur, POC negative negative mg/dL   Urobilinogen, UA 0.2 0.2 or 1.0 E.U./dL   Nitrite, UA Negative Negative   Leukocytes, UA Small (1+) (A) Negative  POCT Microscopic Urinalysis ([facility])  Result Value Ref Range   WBC,UR,HPF,POC Many (A) None WBC/hpf   RBC,UR,HPF,POC None None RBC/hpf   Bacteria Few (A) None, Too numerous to count   Mucus Absent Absent   Epithelial Cells, UR Per Microscopy Few (A) None, Too numerous to count cells/hpf     EKG: NSR, no acute ischemic changes noted. Significant change noted in baseline and rate when compared to prior EKG done 01/02/18.   I have personally reviewed the EKG tracing and agree with the computer interpretation.  Assessment & Plan:   1. Atrial fibrillation, unspecified type (HCC) - ? Paroxysmal - in NSR today - but not sure if pt was really ever in as we just have single EKG reading. She did see Mandy Price w/ further w/u so have requested to have those records resent to the office to see if he noted pt to be in a. Fib ever.  Pt never started xarelto but did encourage her to do so  - at least until we get Mandy Price - if she doesn't like it, we could always re-eval or consider a event monitor. Can stop xarelto 5d prior to dental surgery then resume the following day. Pt agrees.  2. Medication monitoring encounter   3. Tachycardia   4. Hypertension, unspecified type - refilled atenolol-hclorthalidone  5. Rhomboid muscle pain - from working - right upper thoracic back, refilled prn flexeril    Orders Placed This Encounter  Procedures  . Comprehensive metabolic panel  . CBC with Differential/Platelet  . TSH  . POCT urinalysis dipstick  . POCT Microscopic Urinalysis ([facility])  . EKG 12-Lead    Meds ordered this encounter  Medications  . DISCONTD: atenolol-chlorthalidone (TENORETIC) 100-25 MG tablet    Sig: Take 1 tablet by mouth daily.    Dispense:  90 tablet    Refill:  1  . cyclobenzaprine (FLEXERIL) 5 MG tablet    Sig: Take 1 tablet (5 mg total) by mouth 3 (three) times daily as needed for muscle spasms.    Dispense:  90 tablet    Refill:  0  . atenolol-chlorthalidone (TENORETIC) 100-25 MG tablet    Sig: Take 1 tablet by mouth daily.    Dispense:  90 tablet    Refill:  1    I personally performed the services described in this documentation, which was scribed in my presence. The recorded information has been reviewed and considered, and addended by me as needed.   Mandy SorensonEva Shaw, M.D.  Primary Care at [facility]  [place] [address] [place], [place] [id number] [phone] phone [phone] fax  08/11/17 1:04 PM

## 2017-08-08 NOTE — Patient Instructions (Addendum)
Start on the xarelto blood thinner - you can stop it 5 days before any dental surgery and then restart it the day after.  IF you received an x-ray today, you will receive an invoice from [facility]. Please contact [facility] at [phone] with questions or concerns regarding your invoice.   IF you received labwork today, you will receive an invoice from [facility]. Please contact [facility] at [phone] with questions or concerns regarding your invoice.   Our billing staff will not be able to assist you with questions regarding bills from these companies.  You will be contacted with the lab results as soon as they are available. The fastest way to get your results is to activate your My Chart account. Instructions are located on the last page of this paperwork. If you have not heard from us regarding the results in 2 weeks, please contact this office.    Atrial Fibrillation Atrial fibrillation is a type of heartbeat that is irregular or fast (rapid). If you have this condition, your heart keeps quivering in a weird (chaotic) way. This condition can make it so your heart cannot pump blood normally. Having this condition gives a person more risk for stroke, heart failure, and other heart problems. There are different types of atrial fibrillation. Talk with your doctor to learn about the type that you have. Follow these instructions at home:  Take over-the-counter and prescription medicines only as told by your doctor.  If your doctor prescribed a blood-thinning medicine, take it exactly as told. Taking too much of it can cause bleeding. If you do not take enough of it, you will not have the protection that you need against stroke and other problems.  Do not use any tobacco products. These include cigarettes, chewing tobacco, and e-cigarettes. If you need help quitting, ask your doctor.  If you have apnea (obstructive sleep apnea), manage it as told by your doctor.  Do not  drink alcohol.  Do not drink beverages that have caffeine. These include coffee, soda, and tea.  Maintain a healthy weight. Do not use diet pills unless your doctor says they are safe for you. Diet pills may make heart problems worse.  Follow diet instructions as told by your doctor.  Exercise regularly as told by your doctor.  Keep all follow-up visits as told by your doctor. This is important. Contact a doctor if:  You notice a change in the speed, rhythm, or strength of your heartbeat.  You are taking a blood-thinning medicine and you notice more bruising.  You get tired more easily when you move or exercise. Get help right away if:  You have pain in your chest or your belly (abdomen).  You have sweating or weakness.  You feel sick to your stomach (nauseous).  You notice blood in your throw up (vomit), poop (stool), or pee (urine).  You are short of breath.  You suddenly have swollen feet and ankles.  You feel dizzy.  Your suddenly get weak or numb in your face, arms, or legs, especially if it happens on one side of your body.  You have trouble talking, trouble understanding, or both.  Your face or your eyelid droops on one side. These symptoms may be an emergency. Do not wait to see if the symptoms will go away. Get medical help right away. Call your local emergency services (911 in the [place] Do not drive yourself to the hospital. This information is not intended to replace advice given to you by  your health care provider. Make sure you discuss any questions you have with your health care provider. Document Released: 04/22/2008 Document Revised: 12/20/2015 Document Reviewed: 11/08/2014 Elsevier Interactive Patient Education  [employer].

## 2017-08-09 LAB — CBC WITH DIFFERENTIAL/PLATELET
BASOS ABS: 0 10*3/uL (ref 0.0–0.2)
Basos: 1 %
EOS (ABSOLUTE): 0.1 10*3/uL (ref 0.0–0.4)
Eos: 2 %
HEMOGLOBIN: 13 g/dL (ref 11.1–15.9)
Hematocrit: 41.5 % (ref 34.0–46.6)
Immature Grans (Abs): 0 10*3/uL (ref 0.0–0.1)
Immature Granulocytes: 0 %
LYMPHS: 41 %
Lymphocytes Absolute: 2.6 10*3/uL (ref 0.7–3.1)
MCH: 25.4 pg — AB (ref 26.6–33.0)
MCHC: 31.3 g/dL — ABNORMAL LOW (ref 31.5–35.7)
MCV: 81 fL (ref 79–97)
MONOCYTES: 8 %
Monocytes Absolute: 0.5 10*3/uL (ref 0.1–0.9)
Neutrophils Absolute: 3.1 10*3/uL (ref 1.4–7.0)
Neutrophils: 48 %
PLATELETS: 269 10*3/uL (ref 150–379)
RBC: 5.12 x10E6/uL (ref 3.77–5.28)
RDW: 14.2 % (ref 12.3–15.4)
WBC: 6.4 10*3/uL (ref 3.4–10.8)

## 2017-08-09 LAB — COMPREHENSIVE METABOLIC PANEL
ALK PHOS: 62 IU/L (ref 39–117)
ALT: 31 IU/L (ref 0–32)
AST: 27 IU/L (ref 0–40)
Albumin/Globulin Ratio: 1.5 (ref 1.2–2.2)
Albumin: 4.8 g/dL (ref 3.6–4.8)
BILIRUBIN TOTAL: 0.4 mg/dL (ref 0.0–1.2)
BUN/Creatinine Ratio: 20 (ref 12–28)
BUN: 16 mg/dL (ref 8–27)
CHLORIDE: 100 mmol/L (ref 96–106)
CO2: 21 mmol/L (ref 20–29)
Calcium: 9.9 mg/dL (ref 8.7–10.3)
Creatinine, Ser: 0.8 mg/dL (ref 0.57–1.00)
GFR calc Af Amer: 92 mL/min/{1.73_m2} (ref >59)
GFR calc non Af Amer: 80 mL/min/{1.73_m2} (ref >59)
GLUCOSE: 83 mg/dL (ref 65–99)
Globulin, Total: 3.2 g/dL (ref 1.5–4.5)
POTASSIUM: 4 mmol/L (ref 3.5–5.2)
Sodium: 141 mmol/L (ref 134–144)
Total Protein: 8 g/dL (ref 6.0–8.5)

## 2017-08-09 LAB — TSH: TSH: 1.25 u[IU]/mL (ref 0.450–4.500)

## 2017-08-11 ENCOUNTER — Telehealth: Payer: Self-pay | Admitting: Physician Assistant

## 2017-08-11 DIAGNOSIS — I4891 Unspecified atrial fibrillation: Secondary | ICD-10-CM

## 2017-08-11 NOTE — Telephone Encounter (Signed)
Pt. called to request lab results.  Lab results were not forwarded to the Results folder for [facility].  Informed of Dr. Alver FisherShaw's review of labs; no new recommendations at this time.  The pt. stated she needs a refill on Xarelto, and that her pharmacy of preference is "[facility]  Will send message to Dr. Alver FisherShaw's office re: need to reorder Xarelto.

## 2017-08-15 ENCOUNTER — Encounter: Payer: Self-pay | Admitting: Radiology

## 2017-08-15 MED ORDER — RIVAROXABAN 20 MG PO TABS: 20.0000 mg | ORAL_TABLET | Freq: Every day | ORAL | 1 refills | Status: DC

## 2017-08-15 NOTE — Telephone Encounter (Signed)
Please advise on xarelto, not sure if Dr. Docia BarrierGanje is supposed to fill this. Labs sent to pt.

## 2017-08-15 NOTE — Telephone Encounter (Signed)
xarelto sent in.

## 2017-11-27 ENCOUNTER — Ambulatory Visit: Payer: Commercial Managed Care - PPO | Admitting: Family Medicine

## 2017-11-27 ENCOUNTER — Encounter: Payer: Self-pay | Admitting: Family Medicine

## 2017-11-27 VITALS — BP 131/81 | HR 88 | Temp 98.4°F | Resp 17 | Ht 65.0 in | Wt 183.0 lb

## 2017-11-27 DIAGNOSIS — L309 Dermatitis, unspecified: Secondary | ICD-10-CM | POA: Diagnosis not present

## 2017-11-27 DIAGNOSIS — R21 Rash and other nonspecific skin eruption: Secondary | ICD-10-CM | POA: Diagnosis not present

## 2017-11-27 LAB — POCT CBC
Granulocyte percent: 52.3 %G (ref 37–80)
HCT, POC: 38.6 % (ref 37.7–47.9)
HEMOGLOBIN: 12.6 g/dL (ref 12.2–16.2)
LYMPH, POC: 2.2 (ref 0.6–3.4)
MCH, POC: 25 pg — AB (ref 27–31.2)
MCHC: 32.6 g/dL (ref 31.8–35.4)
MCV: 76.7 fL — AB (ref 80–97)
MID (cbc): 0.2 (ref 0–0.9)
MPV: 8.1 fL (ref 0–99.8)
PLATELET COUNT, POC: 201 10*3/uL (ref 142–424)
POC Granulocyte: 2.6 (ref 2–6.9)
POC LYMPH %: 44.6 % (ref 10–50)
POC MID %: 3.1 %M (ref 0–12)
RBC: 5.03 M/uL (ref 4.04–5.48)
RDW, POC: 12.1 %
WBC: 5 10*3/uL (ref 4.6–10.2)

## 2017-11-27 LAB — POCT SEDIMENTATION RATE: POCT SED RATE: 13 mm/h (ref 0–22)

## 2017-11-27 MED ORDER — TRIAMCINOLONE ACETONIDE 0.1 % EX CREA: 1.0000 "application " | TOPICAL_CREAM | Freq: Two times a day (BID) | CUTANEOUS | 0 refills | Status: DC

## 2017-11-27 MED ORDER — AMMONIUM LACTATE 12 % EX CREA: TOPICAL_CREAM | Freq: Two times a day (BID) | CUTANEOUS | 0 refills | Status: DC

## 2017-11-27 NOTE — Progress Notes (Signed)
Subjective:  By signing my name below, I, Mandy Price, attest that this documentation has been prepared under the direction and in the presence of Norberto Sorenson, MD. Electronically Signed: Stann Price, Scribe. 11/27/2017 , 8:32 AM .  Patient was seen in Room 1 .   Patient ID: Mandy Price, female    DOB: 05-31-1956, 62 y.o.   MRN: [id number] Chief Complaint  Patient presents with  . Rash    on legs    HPI Mandy Price is a 62 y.o. female who presents to Primary Care at [facility] complaining of intermittent rash over lower extremities bilaterally. Patient states she initially noticed rash over the dorsum of her left lower extremity about 2 weeks ago. She states the rash is not itchy, and has been spreading up into her calf over the past week. She notes the rash comes and goes, and is currently improving. She also noticed a spot appear right upper lower extremity about 2 weeks ago as well. She denies applying creams, lotions or moisturizers over her legs or feet. She informs newest medication is Xarelto, which she started about 3 months ago. She denies any new soaps, or detergents. She denies sore throat, chills or fever.   Past Medical History:  Diagnosis Date  . Hypertension    No past surgical history on file. Prior to Admission medications   Medication Sig Start Date End Date Taking? Authorizing Provider  atenolol-chlorthalidone (TENORETIC) 100-25 MG tablet Take 1 tablet by mouth daily. 08/08/17   Sherren Mocha, MD  cyclobenzaprine (FLEXERIL) 5 MG tablet Take 1 tablet (5 mg total) by mouth 3 (three) times daily as needed for muscle spasms. 08/08/17   Sherren Mocha, MD  OMEGA-3 KRILL OIL PO Take by mouth daily.    [provider]  rivaroxaban (XARELTO) 20 MG TABS tablet Take 1 tablet (20 mg total) by mouth daily with supper. 08/15/17   Sherren Mocha, MD   No Known Allergies No family history on file. Social History   Socioeconomic History  . Marital status: Married    Spouse  name: Not on file  . Number of children: Not on file  . Years of education: Not on file  . Highest education level: Not on file  Occupational History  . Not on file  Social Needs  . Financial resource strain: Not on file  . Food insecurity:    Worry: Not on file    Inability: Not on file  . Transportation needs:    Medical: Not on file    Non-medical: Not on file  Tobacco Use  . Smoking status: Never Smoker  . Smokeless tobacco: Never Used  Substance and Sexual Activity  . Alcohol use: No  . Drug use: No  . Sexual activity: Yes    Partners: Male  Lifestyle  . Physical activity:    Days per week: Not on file    Minutes per session: Not on file  . Stress: Not on file  Relationships  . Social connections:    Talks on phone: Not on file    Gets together: Not on file    Attends religious service: Not on file    Active member of club or organization: Not on file    Attends meetings of clubs or organizations: Not on file    Relationship status: Not on file  Other Topics Concern  . 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  Respiratory: Negative for cough.   Gastrointestinal: Negative for constipation, diarrhea, nausea and vomiting.  Skin: Positive for rash. Negative for wound.  Neurological: Negative for dizziness, weakness and headaches.       Objective:   Physical Exam  Constitutional: She is oriented to person, place, and time. She appears well-developed and well-nourished. No distress.  HENT:  Head: Normocephalic and atraumatic.  Eyes: Pupils are equal, round, and reactive to light. EOM are normal.  Neck: Neck supple.  Cardiovascular: Normal rate.   Pulmonary/Chest: Effort normal. No respiratory distress.  Musculoskeletal: Normal range of motion.  Neurological: She is alert and oriented to person, place, and time.  Skin: Skin is warm and dry. Rash noted.  Bilateral legs: pinpoint papular scaly dry petechial rash spread over the dorsum of her foot, sparing the sole of the foot, most confluent over dorsum of ankle with more erythematous papules spreading up the calf circumferentially and confluently  Psychiatric: She has a normal mood and affect. Her behavior is normal.  Nursing note and vitals reviewed.   BP 131/81   Pulse 88   Temp 98.4 F (36.9 C) (Oral)   Resp 17   Ht  (1.651 m)   Wt 183 lb (83 kg)   SpO2 98%   BMI 30.45 kg/m   Results for orders placed or performed in visit on 11/27/17  POCT CBC  Result Value Ref Range   WBC 5.0 4.6 - 10.2 K/uL   Lymph, poc 2.2 0.6 - 3.4   POC LYMPH PERCENT 44.6 10 - 50 %L   MID (cbc) 0.2 0 - 0.9   POC MID % 3.1 0 - 12 %M   POC Granulocyte 2.6 2 - 6.9   Granulocyte percent 52.3 37 - 80 %G   RBC 5.03 4.04 - 5.48 M/uL   Hemoglobin 12.6 12.2 - 16.2 g/dL   HCT, POC 16.1 09.6 - 47.9 %   MCV 76.7 (A) 80 - 97 fL   MCH, POC 25.0 (A) 27 - 31.2 pg   MCHC 32.6 31.8 - 35.4 g/dL   RDW, POC 04.5 %   Platelet Count, POC 201 142 - 424 K/uL   MPV 8.1 0 - 99.8 fL  POCT SEDIMENTATION RATE  Result Value Ref Range   POCT SED RATE 13 0 - 22 mm/hr       Assessment & Plan:   1. Rash and nonspecific skin eruption   2. Dermatitis     Orders Placed This Encounter  Procedures  . POCT CBC  . POCT SEDIMENTATION RATE    Meds ordered this encounter  Medications  . ammonium lactate (AMLACTIN) 12 % cream    Sig: Apply topically 2 (two) times daily.    Dispense:  385 g    Refill:  0  . triamcinolone cream (KENALOG) 0.1 %    Sig: Apply 1 application topically 2 (two) times daily. X 1 wk    Dispense:  80 g    Refill:  0   I personally performed the services described in this  documentation, which was scribed in my presence. The recorded information has been reviewed and considered, and addended by me as needed.  Norberto Sorenson, M.D.  Primary Care at [facility] [address] [place], [place] [address] [phone] phone [phone] fax  05/05/18 7:17 PM

## 2017-11-27 NOTE — Patient Instructions (Addendum)
Apply both creams twice a day and then stop the triamcinolone in 1 wk, continue the am-lactin until rash resolves. If worsening RTD.     IF you received an x-ray today, you will receive an invoice from [facility]. Please contact [facility] at [phone] with questions or concerns regarding your invoice.   IF you received labwork today, you will receive an invoice from [facility]. Please contact [facility] at [phone] with questions or concerns regarding your invoice.   Our billing staff will not be able to assist you with questions regarding bills from these companies.  You will be contacted with the lab results as soon as they are available. The fastest way to get your results is to activate your My Chart account. Instructions are located on the last page of this paperwork. If you have not heard from [place] regarding the results in 2 weeks, please contact this office.     Atopic Dermatitis Atopic dermatitis is a skin disorder that causes inflammation of the skin. This is the most common type of eczema. Eczema is a group of skin conditions that cause the skin to be itchy, red, and swollen. This condition is generally worse during the cooler winter months and often improves during the warm summer months. Symptoms can vary from person to person. Atopic dermatitis usually starts showing signs in infancy and can last through adulthood. This condition cannot be passed from one person to another (non-contagious), but it is more common in families. Atopic dermatitis may not always be present. When it is present, it is called a flare-up. What are the causes? The exact cause of this condition is not known. Flare-ups of the condition may be triggered by:  Contact with something that you are sensitive or allergic to.  Stress.  Certain foods.  Extremely hot or cold weather.  Harsh chemicals and soaps.  Dry air.  Chlorine.  What increases the risk? This condition is more  likely to develop in people who have a personal history or family history of eczema, allergies, asthma, or hay fever. What are the signs or symptoms? Symptoms of this condition include:  Dry, scaly skin.  Red, itchy rash.  Itchiness, which can be severe. This may occur before the skin rash. This can make sleeping difficult.  Skin thickening and cracking that can occur over time.  How is this diagnosed? This condition is diagnosed based on your symptoms, a medical history, and a physical exam. How is this treated? There is no cure for this condition, but symptoms can usually be controlled. Treatment focuses on:  Controlling the itchiness and scratching. You may be given medicines, such as antihistamines or steroid creams.  Limiting exposure to things that you are sensitive or allergic to (allergens).  Recognizing situations that cause stress and developing a plan to manage stress.  If your atopic dermatitis does not get better with medicines, or if it is all over your body (widespread), a treatment using a specific type of light (phototherapy) may be used. Follow these instructions at home: Skin care  Keep your skin well-moisturized. Doing this seals in moisture and helps to prevent dryness. ? Use unscented lotions that have petroleum in them. ? Avoid lotions that contain alcohol or water. They can dry the skin.  Keep baths or showers short (less than 5 minutes) in warm water. Do not use hot water. ? Use mild, unscented cleansers for bathing. Avoid soap and bubble bath. ? Apply a moisturizer to your skin right after a bath  or shower.  Do not apply anything to your skin without checking with your health care provider. General instructions  Dress in clothes made of cotton or cotton blends. Dress lightly because heat increases itchiness.  When washing your clothes, rinse your clothes twice so all of the soap is removed.  Avoid any triggers that can cause a flare-up.  Try to  manage your stress.  Keep your fingernails cut short.  Avoid scratching. Scratching makes the rash and itchiness worse. It may also result in a skin infection (impetigo) due to a break in the skin caused by scratching.  Take or apply over-the-counter and prescription medicines only as told by your health care provider.  Keep all follow-up visits as told by your health care provider. This is important.  Do not be around people who have cold sores or fever blisters. If you get the infection, it may cause your atopic dermatitis to worsen. Contact a health care provider if:  Your itchiness interferes with sleep.  Your rash gets worse or it is not better within one week of starting treatment.  You have a fever.  You have a rash flare-up after having contact with someone who has cold sores or fever blisters. Get help right away if:  You develop pus or soft yellow scabs in the rash area. Summary  This condition causes a red rash and itchy, dry, scaly skin.  Treatment focuses on controlling the itchiness and scratching, limiting exposure to things that you are sensitive or allergic to (allergens), recognizing situations that cause stress, and developing a plan to manage stress.  Keep your skin well-moisturized.  Keep baths or showers shorter than 5 minutes and use warm water. Do not use hot water. This information is not intended to replace advice given to you by your health care provider. Make sure you discuss any questions you have with your health care provider. Document Released: 07/11/2000 Document Revised: 08/15/2016 Document Reviewed: 08/15/2016 Elsevier Interactive Patient Education  [employer].

## 2017-12-21 ENCOUNTER — Encounter: Payer: Self-pay | Admitting: Family Medicine

## 2018-01-24 ENCOUNTER — Other Ambulatory Visit: Payer: Self-pay | Admitting: Family Medicine

## 2018-01-24 DIAGNOSIS — I4891 Unspecified atrial fibrillation: Secondary | ICD-10-CM

## 2018-01-25 NOTE — Telephone Encounter (Signed)
Patient called, left VM to return call back to the office. Will need a f/u appointment as noted last OV to return around 02/05/18 for medication follow up.

## 2018-02-12 ENCOUNTER — Other Ambulatory Visit: Payer: Self-pay | Admitting: Physician Assistant

## 2018-02-13 ENCOUNTER — Ambulatory Visit: Payer: Commercial Managed Care - PPO | Admitting: Family Medicine

## 2018-02-13 ENCOUNTER — Telehealth: Payer: Self-pay | Admitting: Family Medicine

## 2018-02-13 ENCOUNTER — Encounter: Payer: Self-pay | Admitting: Family Medicine

## 2018-02-13 ENCOUNTER — Other Ambulatory Visit: Payer: Self-pay

## 2018-02-13 VITALS — BP 118/70 | HR 84 | Temp 98.6°F | Resp 16 | Ht 65.35 in | Wt 187.0 lb

## 2018-02-13 DIAGNOSIS — I4891 Unspecified atrial fibrillation: Secondary | ICD-10-CM

## 2018-02-13 DIAGNOSIS — Z7901 Long term (current) use of anticoagulants: Secondary | ICD-10-CM | POA: Diagnosis not present

## 2018-02-13 DIAGNOSIS — L309 Dermatitis, unspecified: Secondary | ICD-10-CM | POA: Diagnosis not present

## 2018-02-13 MED ORDER — RIVAROXABAN 20 MG PO TABS: ORAL_TABLET | ORAL | 3 refills | Status: DC

## 2018-02-13 MED ORDER — MOMETASONE FUROATE 0.1 % EX CREA: 1.0000 "application " | TOPICAL_CREAM | Freq: Every day | CUTANEOUS | 2 refills | Status: DC

## 2018-02-13 NOTE — Patient Instructions (Addendum)
If the rash has not completely resolved wihtin 2 weeks, then stop the med and RTC on 1-2 weeks for skin biopsy and/or derm referral.  If rash comes back after you stop the new topical cream mometasone in 2 weeks, then come back for a skin biopsy and/or derm referral.  If you develop any side effects to the topical cream, stop immediately and come back in after a month for the biopsy.  IF you received an x-ray today, you will receive an invoice from [facility]. Please contact [facility] at [phone] with questions or concerns regarding your invoice.   IF you received labwork today, you will receive an invoice from [facility]. Please contact [facility] at [phone] with questions or concerns regarding your invoice.   Our billing staff will not be able to assist you with questions regarding bills from these companies.  You will be contacted with the lab results as soon as they are available. The fastest way to get your results is to activate your My Chart account. Instructions are located on the last page of this paperwork. If you have not heard from [place] regarding the results in 2 weeks, please contact this office.      Skin Biopsy A skin biopsy is a procedure to remove a sample of your skin (lesion) so it can be checked under a microscope. You may need a skin biopsy if you have a skin disease or abnormal changes in your skin. Tell a health care provider about:  Any allergies you have.  All medicines you are taking, including vitamins, herbs, eye drops, creams, and over-the-counter medicines.  Any problems you or family members have had with anesthetic medicines.  Any blood disorders you have.  Any surgeries you have had.  Any medical conditions you have. What are the risks? Generally, this is a safe procedure. However, problems may occur, including:  Infection.  Bleeding.  Allergic reaction to medicines.  Scarring.  What happens before the procedure?  Ask  your health care provider about changing or stopping your regular medicines. This is especially important if you are taking diabetes medicines or blood thinners.  Follow instructions from your health care provider about how to care for your skin.  Ask your health care provider how your biopsy site will be marked or identified.  You may be given antibiotic medicine to prevent infection. What happens during the procedure?  To reduce your risk of infection: ? Your health care team will wash or sanitize their hands. ? Your skin will be washed with soap.  You may be given a medicine to help you relax (sedative).  You will be givena medicine to numb the area (local anesthetic).  The method that your health care provider will use for your skin biopsy will depend on the type of skin problem you have. Options include: ? Shave biopsy. Your health care provider will shave away layers of your skin lesion with a sharp blade. After shaving, a gel or ointment may be used to control bleeding. ? Punch biopsy. Your health care provider will use a tool to remove all or part of the lesion. This leaves a small hole about the width of a pencil eraser. The area may be covered with a gel or ointment. ? Excisional or incisional biopsy. Your health care provider will use a surgical blade to remove all or part of your lesion.  Your skin biopsy site may be closed with stitches (sutures).  A bandage (dressing) will be applied. The procedure  may vary among health care providers and hospitals. What happens after the procedure?  Your skin sample will be sent to a laboratory for examination.  Your skin biopsy site will be watched to make sure that it stops bleeding.  Do not drive for 24 hours if you received a sedative. This information is not intended to replace advice given to you by your health care provider. Make sure you discuss any questions you have with your health care provider. Document Released:  08/15/2004 Document Revised: 03/09/2016 Document Reviewed: 10/11/2014 Elsevier Interactive Patient Education  2018 [employer].   Atopic Dermatitis Atopic dermatitis is a skin disorder that causes inflammation of the skin. This is the most common type of eczema. Eczema is a group of skin conditions that cause the skin to be itchy, red, and swollen. This condition is generally worse during the cooler winter months and often improves during the warm summer months. Symptoms can vary from person to person. Atopic dermatitis usually starts showing signs in infancy and can last through adulthood. This condition cannot be passed from one person to another (non-contagious), but it is more common in families. Atopic dermatitis may not always be present. When it is present, it is called a flare-up. What are the causes? The exact cause of this condition is not known. Flare-ups of the condition may be triggered by:  Contact with something that you are sensitive or allergic to.  Stress.  Certain foods.  Extremely hot or cold weather.  Harsh chemicals and soaps.  Dry air.  Chlorine.  What increases the risk? This condition is more likely to develop in people who have a personal history or family history of eczema, allergies, asthma, or hay fever. What are the signs or symptoms? Symptoms of this condition include:  Dry, scaly skin.  Red, itchy rash.  Itchiness, which can be severe. This may occur before the skin rash. This can make sleeping difficult.  Skin thickening and cracking that can occur over time.  How is this diagnosed? This condition is diagnosed based on your symptoms, a medical history, and a physical exam. How is this treated? There is no cure for this condition, but symptoms can usually be controlled. Treatment focuses on:  Controlling the itchiness and scratching. You may be given medicines, such as antihistamines or steroid creams.  Limiting exposure to things that you  are sensitive or allergic to (allergens).  Recognizing situations that cause stress and developing a plan to manage stress.  If your atopic dermatitis does not get better with medicines, or if it is all over your body (widespread), a treatment using a specific type of light (phototherapy) may be used. Follow these instructions at home: Skin care  Keep your skin well-moisturized. Doing this seals in moisture and helps to prevent dryness. ? Use unscented lotions that have petroleum in them. ? Avoid lotions that contain alcohol or water. They can dry the skin.  Keep baths or showers short (less than 5 minutes) in warm water. Do not use hot water. ? Use mild, unscented cleansers for bathing. Avoid soap and bubble bath. ? Apply a moisturizer to your skin right after a bath or shower.  Do not apply anything to your skin without checking with your health care provider. General instructions  Dress in clothes made of cotton or cotton blends. Dress lightly because heat increases itchiness.  When washing your clothes, rinse your clothes twice so all of the soap is removed.  Avoid any triggers that can cause  a flare-up.  Try to manage your stress.  Keep your fingernails cut short.  Avoid scratching. Scratching makes the rash and itchiness worse. It may also result in a skin infection (impetigo) due to a break in the skin caused by scratching.  Take or apply over-the-counter and prescription medicines only as told by your health care provider.  Keep all follow-up visits as told by your health care provider. This is important.  Do not be around people who have cold sores or fever blisters. If you get the infection, it may cause your atopic dermatitis to worsen. Contact a health care provider if:  Your itchiness interferes with sleep.  Your rash gets worse or it is not better within one week of starting treatment.  You have a fever.  You have a rash flare-up after having contact with  someone who has cold sores or fever blisters. Get help right away if:  You develop pus or soft yellow scabs in the rash area. Summary  This condition causes a red rash and itchy, dry, scaly skin.  Treatment focuses on controlling the itchiness and scratching, limiting exposure to things that you are sensitive or allergic to (allergens), recognizing situations that cause stress, and developing a plan to manage stress.  Keep your skin well-moisturized.  Keep baths or showers shorter than 5 minutes and use warm water. Do not use hot water. This information is not intended to replace advice given to you by your health care provider. Make sure you discuss any questions you have with your health care provider. Document Released: 07/11/2000 Document Revised: 08/15/2016 Document Reviewed: 08/15/2016 Elsevier Interactive Patient Education  [employer]2018 Elsevier Inc.

## 2018-02-13 NOTE — Progress Notes (Signed)
Subjective:    Patient ID: Mandy Price, female    DOB: 1956-03-28, 62 y.o.   MRN: [id number] Chief Complaint  Patient presents with  . Rash    pt was seen on 5/3 fo a rash and states rash is still present, she also states the feels the rash has spread up her leg.     HPI The cream helped but as soon as she stopped it it came right beack - was gone for after about 5d and then stopped it after a ewek per instructions and then within 2d the rash came backj. The daily maintenance cream did not work at all. She has restarted the weekly only cream and is on day 7 today so would have to stop today but the rash is still present though it looking better than 1 weeks ago.  The rash is climging to be above knees B now though was initially only around ankle.  Not itchy, not painful. Is scared as she has no h/o rashes before.   Has not tried anything else other than the rx topical creams.   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  Then the rash appears mid April which would be 2-2.5 months after starting to take it.   Past Medical History:  Diagnosis Date  . Hypertension    History reviewed. No pertinent surgical history. Current Outpatient Medications on File Prior to Visit  Medication Sig Dispense Refill  . ammonium lactate (AMLACTIN) 12 % cream Apply topically 2 (two) times daily. 385 g 0  . atenolol-chlorthalidone (TENORETIC) 100-25 MG tablet Take 1 tablet by mouth daily. 90 tablet 1  . cyclobenzaprine (FLEXERIL) 5 MG tablet Take 1 tablet (5 mg total) by mouth 3 (three) times daily as needed for muscle spasms. 90 tablet 0  . OMEGA-3 KRILL OIL PO Take by mouth daily.    Marland Kitchen. triamcinolone cream (KENALOG) 0.1 % Apply 1 application topically 2 (two) times daily. X 1 wk 80 g 0  . XARELTO 20 MG TABS tablet TAKE 1 TABLET DAILY WITH SUPPER 90  tablet 0   No current facility-administered medications on file prior to visit.    No Known Allergies History reviewed. No pertinent family history. Social History   Socioeconomic History  . Marital status: Married    Spouse name: Not on file  . Number of children: Not on file  . Years of education: Not on file  . Highest education level: Not on file  Occupational History  . Not on file  Social Needs  . Financial resource strain: Not on file  . Food insecurity:    Worry: Not on file    Inability: Not on file  . Transportation needs:    Medical: Not on file    Non-medical: Not on file  Tobacco Use  . Smoking status: Never Smoker  . Smokeless tobacco: Never Used  Substance and Sexual Activity  . Alcohol use: No  . Drug use: No  . Sexual activity: Yes    Partners: Male  Lifestyle  . Physical activity:    Days per week: Not on file    Minutes per session: Not on file  . Stress: Not on file  Relationships  . Social connections:    Talks on phone: Not on file    Gets together:  Not on file    Attends religious service: Not on file    Active member of club or organization: Not on file    Attends meetings of clubs or organizations: Not on file    Relationship status: Not on file  Other Topics Concern  . Not on file  Social History Narrative   Married   4 grown children   Restaurant Cook   Depression screen [facility] 2/9 02/13/2018 11/27/2017 08/08/2017 01/05/2017 01/02/2017  Decreased Interest 0 0 0 0 0  Down, Depressed, Hopeless 0 0 0 0 0  PHQ - 2 Score 0 0 0 0 0     Review of Systems See hpi    Objective:   Physical Exam  Constitutional: She is oriented to person, place, and time. She appears well-developed and well-nourished. No distress.  HENT:  Head: Normocephalic and atraumatic.  Right Ear: External ear normal.  Eyes: Conjunctivae are normal. No scleral icterus.  Pulmonary/Chest: Effort normal.  Neurological: She is alert and oriented to person, place, and time.    Skin: Skin is warm and dry. She is not diaphoretic. No erythema.  Psychiatric: She has a normal mood and affect. Her behavior is normal.      BP 118/70   Pulse 84   Temp 98.6 F (37 C) (Oral)   Resp 16   Ht 5' 5.35" (1.66 m)   Wt 187 lb (84.8 kg)   SpO2 94%   BMI 30.78 kg/m   Assessment & Plan:   1. Dermatitis   2. Atrial fibrillation, unspecified type (HCC)   3. On continuous oral anticoagulation     Meds ordered this encounter  Medications  . mometasone (ELOCON) 0.1 % cream    Sig: Apply 1 application topically daily. x2 weeks, then prn to bilateral lower extremity rash    Dispense:  50 g    Refill:  2  . rivaroxaban (XARELTO) 20 MG TABS tablet    Sig: TAKE 1 TABLET DAILY WITH SUPPER    Dispense:  90 tablet    Refill:  3    Pt MUST keep appt 02/13/2018     Norberto Sorenson, M.D.  Primary Care at [facility] [address] [place], [place] [address] [phone] phone [phone] fax  02/13/18 11:59 AM

## 2018-02-13 NOTE — Telephone Encounter (Signed)
Pt brought in a letter from her insurance stating that they have been trying to contact us to get us to complete the prior authorization process but have not heard back so that are unable to fill her xarelto which she is on for stroke/embolism prevention in atrial fibrillation.   Please complete whatever PA is needed so pt can get this medication ASAP

## 2018-02-15 NOTE — Telephone Encounter (Signed)
Dr. Clelia CroftShaw has signed the appeal and it was faxed successfully  Today.Marland Kitchen..Marland Kitchen

## 2018-03-15 ENCOUNTER — Telehealth: Payer: Self-pay | Admitting: Family Medicine

## 2018-03-15 NOTE — Telephone Encounter (Signed)
Copied from CRM [phone]. Topic: Quick Communication - See Telephone Encounter >> Mar 15, 2018  4:31 PM Terisa Starraylor, Brittany L wrote: CRM for notification. See Telephone encounter for: 03/15/18.  Patient said she [facility] for her refill on rivaroxaban (XARELTO) 20 MG TABS tablet and they keep telling her they cant not refill this for her because they need additional information from Dr Clelia CroftShaw. Patient said she is confused and couldn't give me any other information except that Dr Clelia CroftShaw just needs to call.

## 2018-03-16 NOTE — Telephone Encounter (Signed)
Patient needs a PA for Xarelto.

## 2018-03-22 NOTE — Telephone Encounter (Signed)
Patient calling and states that her pharmacy needs to speak with Dr Clelia CroftShaw about the PA needed to get the patient's medication sent.  [phone]

## 2018-04-25 ENCOUNTER — Other Ambulatory Visit: Payer: Self-pay | Admitting: Family Medicine

## 2018-04-25 DIAGNOSIS — R000 Tachycardia, unspecified: Secondary | ICD-10-CM

## 2018-04-25 DIAGNOSIS — I10 Essential (primary) hypertension: Secondary | ICD-10-CM

## 2018-04-25 DIAGNOSIS — I4891 Unspecified atrial fibrillation: Secondary | ICD-10-CM

## 2018-04-29 ENCOUNTER — Encounter: Payer: Self-pay | Admitting: Physician Assistant

## 2018-04-29 ENCOUNTER — Other Ambulatory Visit: Payer: Self-pay

## 2018-04-29 ENCOUNTER — Ambulatory Visit: Payer: Commercial Managed Care - PPO | Admitting: Physician Assistant

## 2018-04-29 DIAGNOSIS — I10 Essential (primary) hypertension: Secondary | ICD-10-CM

## 2018-04-29 MED ORDER — ATENOLOL-CHLORTHALIDONE 100-25 MG PO TABS: 1.0000 | ORAL_TABLET | Freq: Every day | ORAL | 1 refills | Status: DC

## 2018-04-29 NOTE — Patient Instructions (Addendum)
Come back in 1-2 months and see Dr. Clelia Croft for your annual exam.     DASH Eating Plan DASH stands for "Dietary Approaches to Stop Hypertension." The DASH eating plan is a healthy eating plan that has been shown to reduce high blood pressure (hypertension). It may also reduce your risk for type 2 diabetes, heart disease, and stroke. The DASH eating plan may also help with weight loss. What are tips for following this plan? General guidelines  Avoid eating more than 2,300 mg (milligrams) of salt (sodium) a day. If you have hypertension, you may need to reduce your sodium intake to 1,500 mg a day.  Limit alcohol intake to no more than 1 drink a day for nonpregnant women and 2 drinks a day for men. One drink equals 12 oz of beer, 5 oz of wine, or 1 oz of hard liquor.  Work with your health care provider to maintain a healthy body weight or to lose weight. Ask what an ideal weight is for you.  Get at least 30 minutes of exercise that causes your heart to beat faster (aerobic exercise) most days of the week. Activities may include walking, swimming, or biking.  Work with your health care provider or diet and nutrition specialist (dietitian) to adjust your eating plan to your individual calorie needs. Reading food labels  Check food labels for the amount of sodium per serving. Choose foods with less than 5 percent of the Daily Value of sodium. Generally, foods with less than 300 mg of sodium per serving fit into this eating plan.  To find whole grains, look for the word "whole" as the first word in the ingredient list. Shopping  Buy products labeled as "low-sodium" or "no salt added."  Buy fresh foods. Avoid canned foods and premade or frozen meals. Cooking  Avoid adding salt when cooking. Use salt-free seasonings or herbs instead of table salt or sea salt. Check with your health care provider or pharmacist before using salt substitutes.  Do not fry foods. Cook foods using healthy methods  such as baking, boiling, grilling, and broiling instead.  Cook with heart-healthy oils, such as olive, canola, soybean, or sunflower oil. Meal planning   Eat a balanced diet that includes: ? 5 or more servings of fruits and vegetables each day. At each meal, try to fill half of your plate with fruits and vegetables. ? Up to 6-8 servings of whole grains each day. ? Less than 6 oz of lean meat, poultry, or fish each day. A 3-oz serving of meat is about the same size as a deck of cards. One egg equals 1 oz. ? 2 servings of low-fat dairy each day. ? A serving of nuts, seeds, or beans 5 times each week. ? Heart-healthy fats. Healthy fats called Omega-3 fatty acids are found in foods such as flaxseeds and coldwater fish, like sardines, salmon, and mackerel.  Limit how much you eat of the following: ? Canned or prepackaged foods. ? Food that is high in trans fat, such as fried foods. ? Food that is high in saturated fat, such as fatty meat. ? Sweets, desserts, sugary drinks, and other foods with added sugar. ? Full-fat dairy products.  Do not salt foods before eating.  Try to eat at least 2 vegetarian meals each week.  Eat more home-cooked food and less restaurant, buffet, and fast food.  When eating at a restaurant, ask that your food be prepared with less salt or no salt, if possible. What  foods are recommended? The items listed may not be a complete list. Talk with your dietitian about what dietary choices are best for you. Grains Whole-grain or whole-wheat bread. Whole-grain or whole-wheat pasta. Brown rice. [place]. Bulgur. Whole-grain and low-sodium cereals. Pita bread. Low-fat, low-sodium crackers. Whole-wheat flour tortillas. Vegetables Fresh or frozen vegetables (raw, steamed, roasted, or grilled). Low-sodium or reduced-sodium tomato and vegetable juice. Low-sodium or reduced-sodium tomato sauce and tomato paste. Low-sodium or reduced-sodium canned vegetables. Fruits All  fresh, dried, or frozen fruit. Canned fruit in natural juice (without added sugar). Meat and other protein foods Skinless chicken or [place]. Ground chicken or [place]. Pork with fat trimmed off. Fish and seafood. Egg whites. Dried beans, peas, or lentils. Unsalted nuts, nut butters, and seeds. Unsalted canned beans. Lean cuts of beef with fat trimmed off. Low-sodium, lean deli meat. Dairy Low-fat (1%) or fat-free (skim) milk. Fat-free, low-fat, or reduced-fat cheeses. Nonfat, low-sodium ricotta or cottage cheese. Low-fat or nonfat yogurt. Low-fat, low-sodium cheese. Fats and oils Soft margarine without trans fats. Vegetable oil. Low-fat, reduced-fat, or light mayonnaise and salad dressings (reduced-sodium). Canola, safflower, olive, soybean, and sunflower oils. Avocado. Seasoning and other foods Herbs. Spices. Seasoning mixes without salt. Unsalted popcorn and pretzels. Fat-free sweets. What foods are not recommended? The items listed may not be a complete list. Talk with your dietitian about what dietary choices are best for you. Grains Baked goods made with fat, such as croissants, muffins, or some breads. Dry pasta or rice meal packs. Vegetables Creamed or fried vegetables. Vegetables in a cheese sauce. Regular canned vegetables (not low-sodium or reduced-sodium). Regular canned tomato sauce and paste (not low-sodium or reduced-sodium). Regular tomato and vegetable juice (not low-sodium or reduced-sodium). Angie Fava. Olives. Fruits Canned fruit in a light or heavy syrup. Fried fruit. Fruit in cream or butter sauce. Meat and other protein foods Fatty cuts of meat. Ribs. Fried meat. Berniece Salines. Sausage. Bologna and other processed lunch meats. Salami. Fatback. Hotdogs. Bratwurst. Salted nuts and seeds. Canned beans with added salt. Canned or smoked fish. Whole eggs or egg yolks. Chicken or [place] with skin. Dairy Whole or 2% milk, cream, and half-and-half. Whole or full-fat cream cheese. Whole-fat or  sweetened yogurt. Full-fat cheese. Nondairy creamers. Whipped toppings. Processed cheese and cheese spreads. Fats and oils Butter. Stick margarine. Lard. Shortening. Ghee. Bacon fat. Tropical oils, such as coconut, palm kernel, or palm oil. Seasoning and other foods Salted popcorn and pretzels. Onion salt, garlic salt, seasoned salt, table salt, and sea salt. Worcestershire sauce. Tartar sauce. Barbecue sauce. Teriyaki sauce. Soy sauce, including reduced-sodium. Steak sauce. Canned and packaged gravies. Fish sauce. Oyster sauce. Cocktail sauce. Horseradish that you find on the shelf. Ketchup. Mustard. Meat flavorings and tenderizers. Bouillon cubes. Hot sauce and Tabasco sauce. Premade or packaged marinades. Premade or packaged taco seasonings. Relishes. Regular salad dressings. Where to find more information:  National Heart, Lung, and [place]: https://wilson-eaton.com/  American Heart Association: www.heart.org Summary  The DASH eating plan is a healthy eating plan that has been shown to reduce high blood pressure (hypertension). It may also reduce your risk for type 2 diabetes, heart disease, and stroke.  With the DASH eating plan, you should limit salt (sodium) intake to 2,300 mg a day. If you have hypertension, you may need to reduce your sodium intake to 1,500 mg a day.  When on the DASH eating plan, aim to eat more fresh fruits and vegetables, whole grains, lean proteins, low-fat dairy, and heart-healthy fats.  Work with  your health care provider or diet and nutrition specialist (dietitian) to adjust your eating plan to your individual calorie needs. This information is not intended to replace advice given to you by your health care provider. Make sure you discuss any questions you have with your health care provider. Document Released: 07/03/2011 Document Revised: 07/07/2016 Document Reviewed: 07/07/2016 Elsevier Interactive Patient Education  2018 [employer].  IF you received an  x-ray today, you will receive an invoice from [facility]. Please contact [facility] at [phone] with questions or concerns regarding your invoice.   IF you received labwork today, you will receive an invoice from [facility]. Please contact [facility] at [phone] with questions or concerns regarding your invoice.   Our billing staff will not be able to assist you with questions regarding bills from these companies.  You will be contacted with the lab results as soon as they are available. The fastest way to get your results is to activate your My Chart account. Instructions are located on the last page of this paperwork. If you have not heard from [place] regarding the results in 2 weeks, please contact this office.

## 2018-04-29 NOTE — Progress Notes (Signed)
   Mandy Price  MRN: [id number] DOB: 22-Aug-1955  PCP: Sherren Mocha, MD  Subjective:  Pt is a 62 year old female who presents to clinic for HTN check.   HTN - atenolol-hclorthalidone 100-25 mg daily. She needs medication refill. blood pressure today is 129/85. Denies lightheadedness, dizziness, chronic headache, double vision, chest pain, shortness of breath, heart racing, palpitations, nausea, vomiting, abdominal pain, hematuria, lower leg swelling.  Exercises 1-2 times/week.  Tries to eat healthy.    reports that she has never smoked. She has never used smokeless tobacco. She reports that she does not drink alcohol or use drugs.  Review of Systems  Constitutional: Negative for diaphoresis and fatigue.  Cardiovascular: Negative for chest pain, palpitations and leg swelling.  Neurological: Negative for dizziness, light-headedness and headaches.    Patient Active Problem List   Diagnosis Date Noted  . Atrial fibrillation (HCC) 03/09/2017  . Plantar fasciitis 06/06/2016  . Colitis 07/06/2013  . Back pain, thoracic 04/28/2012  . Obesity (BMI 30.0-34.9) 04/28/2012  . HTN (hypertension) 01/22/2012    Current Outpatient Medications on File Prior to Visit  Medication Sig Dispense Refill  . atenolol-chlorthalidone (TENORETIC) 100-25 MG tablet Take 1 tablet by mouth daily. 90 tablet 1  . cyclobenzaprine (FLEXERIL) 5 MG tablet Take 1 tablet (5 mg total) by mouth 3 (three) times daily as needed for muscle spasms. 90 tablet 0  . mometasone (ELOCON) 0.1 % cream Apply 1 application topically daily. x2 weeks, then prn to bilateral lower extremity rash 50 g 2  . OMEGA-3 KRILL OIL PO Take by mouth daily.    . rivaroxaban (XARELTO) 20 MG TABS tablet TAKE 1 TABLET DAILY WITH SUPPER 90 tablet 3  . ammonium lactate (AMLACTIN) 12 % cream Apply topically 2 (two) times daily. (Patient not taking: Reported on 04/29/2018) 385 g 0  . triamcinolone cream (KENALOG) 0.1 % Apply 1 application topically 2  (two) times daily. X 1 wk (Patient not taking: Reported on 04/29/2018) 80 g 0   No current facility-administered medications on file prior to visit.     No Known Allergies   Objective:  BP 129/85   Pulse 86   Temp 98.7 F (37.1 C) (Oral)   Resp 18   Ht 5' 5.12" (1.654 m)   Wt 187 lb (84.8 kg)   SpO2 100%   BMI 31.01 kg/m   Physical Exam  Constitutional: She is oriented to person, place, and time. No distress.  Cardiovascular: Normal rate, regular rhythm and normal heart sounds.  Neurological: She is alert and oriented to person, place, and time.  Skin: Skin is warm and dry.  Psychiatric: Judgment normal.  Vitals reviewed.   Assessment and Plan :  1. Hypertension, unspecified type - Pt presents for medication refill. Blood pressure today of 129/85. Denies medication SE. RTC in 4-6 weeks for annual exam with Dr. Clelia Croft.  - atenolol-chlorthalidone (TENORETIC) 100-25 MG tablet; Take 1 tablet by mouth daily.  Dispense: 90 tablet; Refill: 1   Whitney Jo-Ann Johanning, PA-C  [facility] at [facility] 04/29/2018 5:00 PM  Please note: Portions of this report may have been transcribed using dragon voice recognition software. Every effort was made to ensure accuracy; however, inadvertent computerized transcription errors may be present.

## 2018-06-28 ENCOUNTER — Other Ambulatory Visit: Payer: Self-pay | Admitting: Physician Assistant

## 2018-06-28 DIAGNOSIS — Z1231 Encounter for screening mammogram for malignant neoplasm of breast: Secondary | ICD-10-CM

## 2018-07-06 ENCOUNTER — Other Ambulatory Visit: Payer: Self-pay

## 2018-07-06 ENCOUNTER — Encounter: Payer: Self-pay | Admitting: Family Medicine

## 2018-07-06 ENCOUNTER — Ambulatory Visit (INDEPENDENT_AMBULATORY_CARE_PROVIDER_SITE_OTHER): Payer: Commercial Managed Care - PPO | Admitting: Family Medicine

## 2018-07-06 VITALS — BP 129/84 | HR 80 | Temp 98.0°F | Resp 16 | Ht 65.75 in | Wt 186.8 lb

## 2018-07-06 DIAGNOSIS — I10 Essential (primary) hypertension: Secondary | ICD-10-CM

## 2018-07-06 DIAGNOSIS — E669 Obesity, unspecified: Secondary | ICD-10-CM

## 2018-07-06 DIAGNOSIS — Z0001 Encounter for general adult medical examination with abnormal findings: Secondary | ICD-10-CM

## 2018-07-06 DIAGNOSIS — Z1212 Encounter for screening for malignant neoplasm of rectum: Secondary | ICD-10-CM

## 2018-07-06 DIAGNOSIS — Z136 Encounter for screening for cardiovascular disorders: Secondary | ICD-10-CM

## 2018-07-06 DIAGNOSIS — Z23 Encounter for immunization: Secondary | ICD-10-CM

## 2018-07-06 DIAGNOSIS — Z1389 Encounter for screening for other disorder: Secondary | ICD-10-CM

## 2018-07-06 DIAGNOSIS — Z1383 Encounter for screening for respiratory disorder NEC: Secondary | ICD-10-CM

## 2018-07-06 DIAGNOSIS — E66811 Obesity, class 1: Secondary | ICD-10-CM

## 2018-07-06 DIAGNOSIS — Z1211 Encounter for screening for malignant neoplasm of colon: Secondary | ICD-10-CM | POA: Diagnosis not present

## 2018-07-06 DIAGNOSIS — Z0000 Encounter for general adult medical examination without abnormal findings: Secondary | ICD-10-CM

## 2018-07-06 DIAGNOSIS — Z124 Encounter for screening for malignant neoplasm of cervix: Secondary | ICD-10-CM | POA: Diagnosis not present

## 2018-07-06 DIAGNOSIS — I4891 Unspecified atrial fibrillation: Secondary | ICD-10-CM

## 2018-07-06 LAB — POCT URINALYSIS DIP (MANUAL ENTRY)
Bilirubin, UA: NEGATIVE
Glucose, UA: NEGATIVE mg/dL
Ketones, POC UA: NEGATIVE mg/dL
Nitrite, UA: NEGATIVE
PROTEIN UA: NEGATIVE mg/dL
SPEC GRAV UA: 1.015 (ref 1.010–1.025)
UROBILINOGEN UA: 0.2 U/dL
pH, UA: 7 (ref 5.0–8.0)

## 2018-07-06 NOTE — Patient Instructions (Addendum)
   If you have lab work done today you will be contacted with your lab results within the next 2 weeks.  If you have not heard from us then please contact us. The fastest way to get your results is to register for My Chart.   IF you received an x-ray today, you will receive an invoice from [facility]. Please contact  Radiology at [phone] with questions or concerns regarding your invoice.   IF you received labwork today, you will receive an invoice from [facility]. Please contact [facility] at [phone] with questions or concerns regarding your invoice.   Our billing staff will not be able to assist you with questions regarding bills from these companies.  You will be contacted with the lab results as soon as they are available. The fastest way to get your results is to activate your My Chart account. Instructions are located on the last page of this paperwork. If you have not heard from us regarding the results in 2 weeks, please contact this office.     Health Maintenance, Female Adopting a healthy lifestyle and getting preventive care can go a long way to promote health and wellness. Talk with your health care provider about what schedule of regular examinations is right for you. This is a good chance for you to check in with your provider about disease prevention and staying healthy. In between checkups, there are plenty of things you can do on your own. Experts have done a lot of research about which lifestyle changes and preventive measures are most likely to keep you healthy. Ask your health care provider for more information. Weight and diet Eat a healthy diet  Be sure to include plenty of vegetables, fruits, low-fat dairy products, and lean protein.  Do not eat a lot of foods high in solid fats, added sugars, or salt.  Get regular exercise. This is one of the most important things you can do for your health. ? Most adults should exercise for at least 150  minutes each week. The exercise should increase your heart rate and make you sweat (moderate-intensity exercise). ? Most adults should also do strengthening exercises at least twice a week. This is in addition to the moderate-intensity exercise.  Maintain a healthy weight  Body mass index (BMI) is a measurement that can be used to identify possible weight problems. It estimates body fat based on height and weight. Your health care provider can help determine your BMI and help you achieve or maintain a healthy weight.  For females 20 years of age and older: ? A BMI below 18.5 is considered underweight. ? A BMI of 18.5 to 24.9 is normal. ? A BMI of 25 to 29.9 is considered overweight. ? A BMI of 30 and above is considered obese.  Watch levels of cholesterol and blood lipids  You should start having your blood tested for lipids and cholesterol at 62 years of age, then have this test every 5 years.  You may need to have your cholesterol levels checked more often if: ? Your lipid or cholesterol levels are high. ? You are older than 62 years of age. ? You are at high risk for heart disease.  Cancer screening Lung Cancer  Lung cancer screening is recommended for adults 55-80 years old who are at high risk for lung cancer because of a history of smoking.  A yearly low-dose CT scan of the lungs is recommended for people who: ? Currently smoke. ? Have quit   within the past 15 years. ? Have at least a 30-pack-year history of smoking. A pack year is smoking an average of one pack of cigarettes a day for 1 year.  Yearly screening should continue until it has been 15 years since you quit.  Yearly screening should stop if you develop a health problem that would prevent you from having lung cancer treatment.  Breast Cancer  Practice breast self-awareness. This means understanding how your breasts normally appear and feel.  It also means doing regular breast self-exams. Let your health care  provider know about any changes, no matter how small.  If you are in your 20s or 30s, you should have a clinical breast exam (CBE) by a health care provider every 1-3 years as part of a regular health exam.  If you are 48 or older, have a CBE every year. Also consider having a breast X-ray (mammogram) every year.  If you have a family history of breast cancer, talk to your health care provider about genetic screening.  If you are at high risk for breast cancer, talk to your health care provider about having an MRI and a mammogram every year.  Breast cancer gene (BRCA) assessment is recommended for women who have family members with BRCA-related cancers. BRCA-related cancers include: ? Breast. ? Ovarian. ? Tubal. ? Peritoneal cancers.  Results of the assessment will determine the need for genetic counseling and BRCA1 and BRCA2 testing.  Cervical Cancer Your health care provider may recommend that you be screened regularly for cancer of the pelvic organs (ovaries, uterus, and vagina). This screening involves a pelvic examination, including checking for microscopic changes to the surface of your cervix (Pap test). You may be encouraged to have this screening done every 3 years, beginning at age 8.  For women ages 8-65, health care providers may recommend pelvic exams and Pap testing every 3 years, or they may recommend the Pap and pelvic exam, combined with testing for human papilloma virus (HPV), every 5 years. Some types of HPV increase your risk of cervical cancer. Testing for HPV may also be done on women of any age with unclear Pap test results.  Other health care providers may not recommend any screening for nonpregnant women who are considered low risk for pelvic cancer and who do not have symptoms. Ask your health care provider if a screening pelvic exam is right for you.  If you have had past treatment for cervical cancer or a condition that could lead to cancer, you need Pap tests  and screening for cancer for at least 20 years after your treatment. If Pap tests have been discontinued, your risk factors (such as having a new sexual partner) need to be reassessed to determine if screening should resume. Some women have medical problems that increase the chance of getting cervical cancer. In these cases, your health care provider may recommend more frequent screening and Pap tests.  Colorectal Cancer  This type of cancer can be detected and often prevented.  Routine colorectal cancer screening usually begins at 62 years of age and continues through 62 years of age.  Your health care provider may recommend screening at an earlier age if you have risk factors for colon cancer.  Your health care provider may also recommend using home test kits to check for hidden blood in the stool.  A small camera at the end of a tube can be used to examine your colon directly (sigmoidoscopy or colonoscopy). This is done to check  for the earliest forms of colorectal cancer.  Routine screening usually begins at age 75.  Direct examination of the colon should be repeated every 5-10 years through 62 years of age. However, you may need to be screened more often if early forms of precancerous polyps or small growths are found.  Skin Cancer  Check your skin from head to toe regularly.  Tell your health care provider about any new moles or changes in moles, especially if there is a change in a mole's shape or color.  Also tell your health care provider if you have a mole that is larger than the size of a pencil eraser.  Always use sunscreen. Apply sunscreen liberally and repeatedly throughout the day.  Protect yourself by wearing long sleeves, pants, a wide-brimmed hat, and sunglasses whenever you are outside.  Heart disease, diabetes, and high blood pressure  High blood pressure causes heart disease and increases the risk of stroke. High blood pressure is more likely to develop  in: ? People who have blood pressure in the high end of the normal range (130-139/85-89 mm Hg). ? People who are overweight or obese. ? People who are African American.  If you are 41-67 years of age, have your blood pressure checked every 3-5 years. If you are 32 years of age or older, have your blood pressure checked every year. You should have your blood pressure measured twice-once when you are at a hospital or clinic, and once when you are not at a hospital or clinic. Record the average of the two measurements. To check your blood pressure when you are not at a hospital or clinic, you can use: ? An automated blood pressure machine at a pharmacy. ? A home blood pressure monitor.  If you are between 44 years and 70 years old, ask your health care provider if you should take aspirin to prevent strokes.  Have regular diabetes screenings. This involves taking a blood sample to check your fasting blood sugar level. ? If you are at a normal weight and have a low risk for diabetes, have this test once every three years after 62 years of age. ? If you are overweight and have a high risk for diabetes, consider being tested at a younger age or more often. Preventing infection Hepatitis B  If you have a higher risk for hepatitis B, you should be screened for this virus. You are considered at high risk for hepatitis B if: ? You were born in a country where hepatitis B is common. Ask your health care provider which countries are considered high risk. ? Your parents were born in a high-risk country, and you have not been immunized against hepatitis B (hepatitis B vaccine). ? You have HIV or AIDS. ? You use needles to inject street drugs. ? You live with someone who has hepatitis B. ? You have had sex with someone who has hepatitis B. ? You get hemodialysis treatment. ? You take certain medicines for conditions, including cancer, organ transplantation, and autoimmune conditions.  Hepatitis C  Blood  testing is recommended for: ? Everyone born from [birth year] through 1965. ? Anyone with known risk factors for hepatitis C.  Sexually transmitted infections (STIs)  You should be screened for sexually transmitted infections (STIs) including gonorrhea and chlamydia if: ? You are sexually active and are younger than 62 years of age. ? You are older than 62 years of age and your health care provider tells you that you are at risk for  this type of infection. ? Your sexual activity has changed since you were last screened and you are at an increased risk for chlamydia or gonorrhea. Ask your health care provider if you are at risk.  If you do not have HIV, but are at risk, it may be recommended that you take a prescription medicine daily to prevent HIV infection. This is called pre-exposure prophylaxis (PrEP). You are considered at risk if: ? You are sexually active and do not regularly use condoms or know the HIV status of your partner(s). ? You take drugs by injection. ? You are sexually active with a partner who has HIV.  Talk with your health care provider about whether you are at high risk of being infected with HIV. If you choose to begin PrEP, you should first be tested for HIV. You should then be tested every 3 months for as long as you are taking PrEP. Pregnancy  If you are premenopausal and you may become pregnant, ask your health care provider about preconception counseling.  If you may become pregnant, take 400 to 800 micrograms (mcg) of folic acid every day.  If you want to prevent pregnancy, talk to your health care provider about birth control (contraception). Osteoporosis and menopause  Osteoporosis is a disease in which the bones lose minerals and strength with aging. This can result in serious bone fractures. Your risk for osteoporosis can be identified using a bone density scan.  If you are 65 years of age or older, or if you are at risk for osteoporosis and fractures, ask your  health care provider if you should be screened.  Ask your health care provider whether you should take a calcium or vitamin D supplement to lower your risk for osteoporosis.  Menopause may have certain physical symptoms and risks.  Hormone replacement therapy may reduce some of these symptoms and risks. Talk to your health care provider about whether hormone replacement therapy is right for you. Follow these instructions at home:  Schedule regular health, dental, and eye exams.  Stay current with your immunizations.  Do not use any tobacco products including cigarettes, chewing tobacco, or electronic cigarettes.  If you are pregnant, do not drink alcohol.  If you are breastfeeding, limit how much and how often you drink alcohol.  Limit alcohol intake to no more than 1 drink per day for nonpregnant women. One drink equals 12 ounces of beer, 5 ounces of wine, or 1 ounces of hard liquor.  Do not use street drugs.  Do not share needles.  Ask your health care provider for help if you need support or information about quitting drugs.  Tell your health care provider if you often feel depressed.  Tell your health care provider if you have ever been abused or do not feel safe at home. This information is not intended to replace advice given to you by your health care provider. Make sure you discuss any questions you have with your health care provider. Document Released: 01/27/2011 Document Revised: 12/20/2015 Document Reviewed: 04/17/2015 Elsevier Interactive Patient Education  2018 Elsevier Inc.  

## 2018-07-06 NOTE — Progress Notes (Signed)
Subjective:    Patient ID: Mandy Price; female   DOB: Dec 09, 1955; 62 y.o.   MRN: [id number]  Chief Complaint  Patient presents with  . Annual Exam    HPI Primary Preventative Screenings: Cervical Cancer:  Family Planning: STI screening: Breast Cancer: Colorectal Cancer: Tobacco use/EtOH/substances: Bone Density: Cardiac: Weight/Blood sugar/Diet/Exercise: BMI Readings from Last 3 Encounters:  07/06/18 30.38 kg/m  04/29/18 31.01 kg/m  02/13/18 30.78 kg/m   Lab Results  Component Value Date   HGBA1C 5.7 01/26/2014   OTC/Vit/Supp/Herbal: Dentist/Optho: Immunizations:  Immunization History  Administered Date(s) Administered  . Hepatitis A 09/24/2004  . Tdap 10/06/2005  . Typhoid Inactivated 09/24/2004     Chronic Medical Conditions:   Medical History: Past Medical History:  Diagnosis Date  . Hypertension    History reviewed. No pertinent surgical history. Current Outpatient Medications on File Prior to Visit  Medication Sig Dispense Refill  . atenolol-chlorthalidone (TENORETIC) 100-25 MG tablet Take 1 tablet by mouth daily. 90 tablet 1  . cyclobenzaprine (FLEXERIL) 5 MG tablet Take 1 tablet (5 mg total) by mouth 3 (three) times daily as needed for muscle spasms. 90 tablet 0  . OMEGA-3 KRILL OIL PO Take by mouth daily.    . rivaroxaban (XARELTO) 20 MG TABS tablet TAKE 1 TABLET DAILY WITH SUPPER 90 tablet 3   No current facility-administered medications on file prior to visit.    No Known Allergies History reviewed. No pertinent family history. Social History   Socioeconomic History  . Marital status: Married    Spouse name: Not on file  . Number of children: 4  . Years of education: Not on file  . Highest education level: Not on file  Occupational History  . Not on file  Social Needs  . Financial resource strain: Not on file  . Food insecurity:    Worry: Not on file    Inability: Not on file  . Transportation needs:    Medical: Not on  file    Non-medical: Not on file  Tobacco Use  . Smoking status: Never Smoker  . Smokeless tobacco: Never Used  Substance and Sexual Activity  . Alcohol use: No  . Drug use: No  . Sexual activity: Yes    Partners: Male  Lifestyle  . Physical activity:    Days per week: Not on file    Minutes per session: Not on file  . Stress: Not on file  Relationships  . Social connections:    Talks on phone: Not on file    Gets together: Not on file    Attends religious service: Not on file    Active member of club or organization: Not on file    Attends meetings of clubs or organizations: Not on file    Relationship status: Not on file  Other Topics Concern  . Not on file  Social History Narrative   Married   4 grown children   Restaurant Cook   [facility] 07/06/2018 04/29/2018 02/13/2018 11/27/2017 08/08/2017  Decreased Interest 0 0 0 0 0  Down, Depressed, Hopeless 0 0 0 0 0  PHQ - 2 Score 0 0 0 0 0     ROS Otherwise as noted in HPI.  Objective:  BP 129/84   Pulse 80   Temp 98 F (36.7 C) (Oral)   Resp 16   Ht 5' 5.75" (1.67 m)   Wt 186 lb 12.8 oz (84.7 kg)   SpO2 100%   BMI 30.38 kg/m  Visual Acuity Screening   Right eye Left eye Both eyes  Without correction:     With correction: 20/20 20/20 20/20    Physical Exam Constitutional:      General: She is not in acute distress.    Appearance: She is well-developed. She is not diaphoretic.  HENT:     Head: Normocephalic and atraumatic.     Right Ear: Tympanic membrane, ear canal and external ear normal.     Left Ear: Tympanic membrane, ear canal and external ear normal.     Nose: Nose normal. No mucosal edema or rhinorrhea.     Mouth/Throat:     Pharynx: Uvula midline. No posterior oropharyngeal erythema.  Eyes:     General: No scleral icterus.       Right eye: No discharge.        Left eye: No discharge.     Conjunctiva/sclera: Conjunctivae normal.     Pupils: Pupils are equal, round, and reactive to  light.  Neck:     Musculoskeletal: Normal range of motion and neck supple.     Thyroid: No thyromegaly.  Cardiovascular:     Rate and Rhythm: Normal rate and regular rhythm.     Heart sounds: Normal heart sounds.  Pulmonary:     Effort: Pulmonary effort is normal. No respiratory distress.     Breath sounds: Normal breath sounds.  Abdominal:     General: Bowel sounds are normal.     Palpations: Abdomen is soft.     Tenderness: There is no abdominal tenderness.  Lymphadenopathy:     Cervical: No cervical adenopathy.  Skin:    General: Skin is warm and dry.     Findings: No erythema.  Neurological:     Mental Status: She is alert and oriented to person, place, and time.     Deep Tendon Reflexes: Reflexes are normal and symmetric.  Psychiatric:        Behavior: Behavior normal.     POC TESTING Office Visit on 07/06/2018  Component Date Value Ref Range Status  . Color, UA 07/06/2018 yellow  yellow Final  . Clarity, UA 07/06/2018 hazy* clear Final  . Glucose, UA 07/06/2018 negative  negative mg/dL Final  . Bilirubin, UA 07/06/2018 negative  negative Final  . Ketones, POC UA 07/06/2018 negative  negative mg/dL Final  . Spec Grav, UA 07/06/2018 1.015  1.010 - 1.025 Final  . Blood, UA 07/06/2018 small* negative Final  . pH, UA 07/06/2018 7.0  5.0 - 8.0 Final  . Protein Ur, POC 07/06/2018 negative  negative mg/dL Final  . Urobilinogen, UA 07/06/2018 0.2  0.2 or 1.0 E.U./dL Final  . Nitrite, UA 16/04/9603 Negative  Negative Final  . Leukocytes, UA 07/06/2018 Small (1+)* Negative Final     Assessment & Plan:   1. Annual physical exam   2. Screening for cervical cancer   3. Screening for colorectal cancer   4. Screening for cardiovascular, respiratory, and genitourinary diseases   5. Essential hypertension   6. Obesity (BMI 30.0-34.9)   7. Atrial fibrillation, unspecified type [facility])    Patient will continue on current chronic medications other than changes noted above, so ok  to refill when needed.   Reviewed all health maintenance recommendations per USPSTF guidelines.   See after visit summary for patient specific instructions.  Orders Placed This Encounter  Procedures  . Flu Vaccine QUAD 36+ mos IM  . CBC with Differential/Platelet  . Comprehensive metabolic panel    Order Specific Question:  Has the patient fasted?    Answer:   Yes  . Lipid panel    Order Specific Question:   Has the patient fasted?    Answer:   Yes  . TSH  . Cologuard  . POCT urinalysis dipstick    Patient verbalized to me that they understand the following: diagnosis, what is being done for them, what to expect and what should be done at home.  Their questions have been answered. They understand that I am unable to predict every possible medication interaction or adverse outcome and that if any unexpected symptoms arise, they should contact us and their pharmacist, as well as never hesitate to seek urgent/emergent care at [facility] or ER if they think it might be warranted.    Norberto Sorenson, MD, MPH Primary Care at [facility] [address] [place], [place]  [address] [phone] Office phone  [phone] Office fax   07/06/18 3:50 PM

## 2018-07-07 LAB — CBC WITH DIFFERENTIAL/PLATELET
BASOS ABS: 0.1 10*3/uL (ref 0.0–0.2)
Basos: 1 %
EOS (ABSOLUTE): 0.1 10*3/uL (ref 0.0–0.4)
EOS: 1 %
Hematocrit: 41.6 % (ref 34.0–46.6)
Hemoglobin: 12.8 g/dL (ref 11.1–15.9)
Immature Grans (Abs): 0 10*3/uL (ref 0.0–0.1)
Immature Granulocytes: 0 %
LYMPHS ABS: 3.7 10*3/uL — AB (ref 0.7–3.1)
LYMPHS: 50 %
MCH: 24.9 pg — AB (ref 26.6–33.0)
MCHC: 30.8 g/dL — ABNORMAL LOW (ref 31.5–35.7)
MCV: 81 fL (ref 79–97)
Monocytes Absolute: 0.6 10*3/uL (ref 0.1–0.9)
Monocytes: 8 %
NEUTROS ABS: 2.9 10*3/uL (ref 1.4–7.0)
Neutrophils: 40 %
Platelets: 264 10*3/uL (ref 150–450)
RBC: 5.14 x10E6/uL (ref 3.77–5.28)
RDW: 13.2 % (ref 12.3–15.4)
WBC: 7.4 10*3/uL (ref 3.4–10.8)

## 2018-07-07 LAB — LIPID PANEL
Chol/HDL Ratio: 2.8 ratio (ref 0.0–4.4)
Cholesterol, Total: 226 mg/dL — ABNORMAL HIGH (ref 100–199)
HDL: 82 mg/dL (ref >39)
LDL Calculated: 127 mg/dL — ABNORMAL HIGH (ref 0–99)
Triglycerides: 86 mg/dL (ref 0–149)
VLDL Cholesterol Cal: 17 mg/dL (ref 5–40)

## 2018-07-07 LAB — COMPREHENSIVE METABOLIC PANEL
ALBUMIN: 4.7 g/dL (ref 3.6–4.8)
ALK PHOS: 61 IU/L (ref 39–117)
ALT: 21 IU/L (ref 0–32)
AST: 26 IU/L (ref 0–40)
Albumin/Globulin Ratio: 1.4 (ref 1.2–2.2)
BILIRUBIN TOTAL: 0.4 mg/dL (ref 0.0–1.2)
BUN/Creatinine Ratio: 16 (ref 12–28)
BUN: 14 mg/dL (ref 8–27)
CALCIUM: 9.9 mg/dL (ref 8.7–10.3)
CHLORIDE: 100 mmol/L (ref 96–106)
CO2: 22 mmol/L (ref 20–29)
CREATININE: 0.85 mg/dL (ref 0.57–1.00)
GFR, EST AFRICAN AMERICAN: 86 mL/min/{1.73_m2} (ref >59)
GFR, EST NON AFRICAN AMERICAN: 74 mL/min/{1.73_m2} (ref >59)
Globulin, Total: 3.3 g/dL (ref 1.5–4.5)
Glucose: 75 mg/dL (ref 65–99)
Potassium: 3.6 mmol/L (ref 3.5–5.2)
Sodium: 136 mmol/L (ref 134–144)
Total Protein: 8 g/dL (ref 6.0–8.5)

## 2018-07-07 LAB — TSH: TSH: 1.61 u[IU]/mL (ref 0.450–4.500)

## 2018-07-09 LAB — PAP IG AND HPV HIGH-RISK: HPV, high-risk: NEGATIVE

## 2018-07-28 DIAGNOSIS — Z1211 Encounter for screening for malignant neoplasm of colon: Secondary | ICD-10-CM | POA: Diagnosis not present

## 2018-07-28 DIAGNOSIS — Z1212 Encounter for screening for malignant neoplasm of rectum: Secondary | ICD-10-CM | POA: Diagnosis not present

## 2018-07-29 ENCOUNTER — Ambulatory Visit
Admission: RE | Admit: 2018-07-29 | Discharge: 2018-07-29 | Disposition: A | Discharge status: Home / Self Care | Payer: Commercial Managed Care - PPO | Source: Ambulatory Visit | Attending: Physician Assistant | Admitting: Physician Assistant

## 2018-07-29 DIAGNOSIS — Z1231 Encounter for screening mammogram for malignant neoplasm of breast: Secondary | ICD-10-CM

## 2018-08-03 LAB — COLOGUARD: Cologuard: NEGATIVE

## 2018-10-26 ENCOUNTER — Other Ambulatory Visit: Payer: Self-pay | Admitting: Physician Assistant

## 2018-10-26 DIAGNOSIS — I10 Essential (primary) hypertension: Secondary | ICD-10-CM

## 2018-10-26 NOTE — Telephone Encounter (Signed)
Please advise on refill request, former Clelia Croft pt

## 2018-10-26 NOTE — Telephone Encounter (Signed)
No longer under provider care need to schedule office visit. (televisit)

## 2018-10-29 ENCOUNTER — Other Ambulatory Visit: Payer: Self-pay | Admitting: *Deleted

## 2018-10-29 DIAGNOSIS — I10 Essential (primary) hypertension: Secondary | ICD-10-CM

## 2018-11-02 ENCOUNTER — Telehealth (INDEPENDENT_AMBULATORY_CARE_PROVIDER_SITE_OTHER): Payer: Commercial Managed Care - PPO | Admitting: Family Medicine

## 2018-11-02 DIAGNOSIS — I10 Essential (primary) hypertension: Secondary | ICD-10-CM

## 2018-11-02 DIAGNOSIS — I480 Paroxysmal atrial fibrillation: Secondary | ICD-10-CM

## 2018-11-02 MED ORDER — ATENOLOL-CHLORTHALIDONE 100-25 MG PO TABS: 1.0000 | ORAL_TABLET | Freq: Every day | ORAL | 1 refills | Status: DC

## 2018-11-02 MED ORDER — RIVAROXABAN 20 MG PO TABS: ORAL_TABLET | ORAL | 1 refills | Status: DC

## 2018-11-02 NOTE — Progress Notes (Signed)
CC- Need a refill on atenolol and xarelto medication. Doing good on medication. Not having any other issus at this time

## 2018-11-02 NOTE — Patient Instructions (Signed)
° ° ° °  If you have lab work done today you will be contacted with your lab results within the next 2 weeks.  If you have not heard from us then please contact us. The fastest way to get your results is to register for My Chart. ° ° °IF you received an x-ray today, you will receive an invoice from [facility]. Please contact [facility] at [phone] with questions or concerns regarding your invoice.  ° °IF you received labwork today, you will receive an invoice from [facility]. Please contact [facility] at [phone] with questions or concerns regarding your invoice.  ° °Our billing staff will not be able to assist you with questions regarding bills from these companies. ° °You will be contacted with the lab results as soon as they are available. The fastest way to get your results is to activate your My Chart account. Instructions are located on the last page of this paperwork. If you have not heard from us regarding the results in 2 weeks, please contact this office. °  ° ° ° °

## 2018-11-02 NOTE — Progress Notes (Signed)
Telemedicine Encounter- SOAP NOTE Established Patient  This telephone encounter was conducted with the patient's (or proxy's) verbal consent via audio telecommunications: yes/no: Yes Patient was instructed to have this encounter in a suitably private space; and to only have persons present to whom they give permission to participate. In addition, patient identity was confirmed by use of name plus two identifiers (DOB and address).  I discussed the limitations, risks, security and privacy concerns of performing an evaluation and management service by telephone and the availability of in person appointments. I also discussed with the patient that there may be a patient responsible charge related to this service. The patient expressed understanding and agreed to proceed.  I spent a total of TIME; 0 MIN TO 60 MIN: 20 minutes talking with the patient or their proxy.  CC: a. Fib, hypertension, med refill  Subjective   Mandy Price is a 63 y.o. established patient. Telephone visit today for  HPI  Atrial Fibrillation Pt takes xarelto and atenolol-chlorthalidone  She states that she has been able to live her life without fatigue She denies chest pains, palpitations, fatigue, diaphoresis, she feels well Lab Results  Component Value Date   HGB 12.8 07/06/2018    Hypertension BP Readings from Last 3 Encounters:  11/02/18 128/87  07/06/18 129/84  04/29/18 129/85    Pt is compliant with her blood pressure medication No muscle cramps Takes her tenoretic Denies chest pains, palpitaitons, shortness of breath Lab Results  Component Value Date   CREATININE 0.85 07/06/2018       Patient Active Problem List   Diagnosis Date Noted  . Atrial fibrillation (HCC) 03/09/2017  . Plantar fasciitis 06/06/2016  . Colitis 07/06/2013  . Back pain, thoracic 04/28/2012  . Obesity (BMI 30.0-34.9) 04/28/2012  . HTN (hypertension) 01/22/2012    Past Medical History:  Diagnosis Date  .  Hypertension     Current Outpatient Medications  Medication Sig Dispense Refill  . atenolol-chlorthalidone (TENORETIC) 100-25 MG tablet Take 1 tablet by mouth daily. 90 tablet 1  . cyclobenzaprine (FLEXERIL) 5 MG tablet Take 1 tablet (5 mg total) by mouth 3 (three) times daily as needed for muscle spasms. 90 tablet 0  . OMEGA-3 KRILL OIL PO Take by mouth daily.    . rivaroxaban (XARELTO) 20 MG TABS tablet TAKE 1 TABLET DAILY WITH SUPPER 90 tablet 1   No current facility-administered medications for this visit.     No Known Allergies  Social History   Socioeconomic History  . Marital status: Married    Spouse name: Not on file  . Number of children: 4  . Years of education: Not on file  . Highest education level: Not on file  Occupational History  . Not on file  Social Needs  . Financial resource strain: Not on file  . Food insecurity:    Worry: Not on file    Inability: Not on file  . Transportation needs:    Medical: Not on file    Non-medical: Not on file  Tobacco Use  . Smoking status: Never Smoker  . Smokeless tobacco: Never Used  Substance and Sexual Activity  . Alcohol use: No  . Drug use: No  . Sexual activity: Yes    Partners: Male  Lifestyle  . Physical activity:    Days per week: Not on file    Minutes per session: Not on file  . Stress: Not on file  Relationships  . Social connections:    Talks on  phone: Not on file    Gets together: Not on file    Attends religious service: Not on file    Active member of club or organization: Not on file    Attends meetings of clubs or organizations: Not on file    Relationship status: Not on file  . Intimate partner violence:    Fear of current or ex partner: Not on file    Emotionally abused: Not on file    Physically abused: Not on file    Forced sexual activity: Not on file  Other Topics Concern  . 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 Condition is stable. Continue current medications and treatment plan. I recommend that you exercise for 30-45 minutes 5 days a week. I also recommend a balanced diet with fruits and vegetables every day, lean meats, and little fried foods. The DASH diet (you can find this online) is a good example of this.  -     atenolol-chlorthalidone (TENORETIC) 100-25 MG tablet; Take 1 tablet by mouth daily.  Atrial fibrillation, paroxysmal (HCC)-  Stable, continue current meds  -     rivaroxaban (XARELTO) 20 MG TABS tablet; TAKE 1 TABLET DAILY WITH SUPPER     I discussed the assessment and treatment plan with the patient. The patient was provided an opportunity to ask questions and all were answered. The patient agreed with the plan and demonstrated an understanding of the instructions.   The patient was advised to call back or seek an in-person evaluation if the symptoms worsen or if the condition fails to improve as anticipated.  I provided 20 minutes of non-face-to-face time during this encounter.  Doristine Bosworth, MD  Primary Care at [facility]

## 2019-02-10 ENCOUNTER — Telehealth: Payer: Self-pay

## 2019-02-10 ENCOUNTER — Ambulatory Visit: Payer: Commercial Managed Care - PPO | Admitting: Family Medicine

## 2019-02-10 ENCOUNTER — Encounter: Payer: Self-pay | Admitting: Cardiology

## 2019-02-10 ENCOUNTER — Ambulatory Visit: Payer: Commercial Managed Care - PPO | Admitting: Cardiology

## 2019-02-10 ENCOUNTER — Other Ambulatory Visit: Payer: Self-pay

## 2019-02-10 VITALS — BP 137/67 | HR 86 | Ht 65.0 in | Wt 188.2 lb

## 2019-02-10 VITALS — BP 136/85 | HR 93 | Temp 98.6°F | Resp 18 | Wt 188.8 lb

## 2019-02-10 DIAGNOSIS — I4892 Unspecified atrial flutter: Secondary | ICD-10-CM | POA: Diagnosis not present

## 2019-02-10 DIAGNOSIS — E669 Obesity, unspecified: Secondary | ICD-10-CM

## 2019-02-10 DIAGNOSIS — I480 Paroxysmal atrial fibrillation: Secondary | ICD-10-CM

## 2019-02-10 DIAGNOSIS — R000 Tachycardia, unspecified: Secondary | ICD-10-CM | POA: Insufficient documentation

## 2019-02-10 DIAGNOSIS — I10 Essential (primary) hypertension: Secondary | ICD-10-CM | POA: Diagnosis not present

## 2019-02-10 NOTE — Telephone Encounter (Signed)
Pt is scheduled for today  °

## 2019-02-10 NOTE — Progress Notes (Signed)
Primary Physician:  Wandra Feinsteinorum, Lisa L, MD   Patient ID: Mandy Price, female    DOB: 08-May-1956, 63 y.o.   MRN: [id number]  Subjective:    Chief Complaint  Patient presents with  . Atrial Fibrillation  . Follow-up    HPI: Mandy Price  is a 63 y.o. female  with hypertension, last seen by us in 2018 for new onset atrial fibrillation and was started on anticoagulation at that time, referred back to us today for Atrial flutter noted on EKG in their office.   When patient was last seen in 2018, she was started on diltiazem for rate control, but she did not start.  She underwent echocardiogram that showed normal LVEF.  We had recommended undergoing Lexiscan nuclear stress testing to exclude ischemic etiology for A. fib as well as undergoing cardioversion.  Patient did not undergo these as she did not feel that she needed them and was lost to follow up.  She reports 2 days ago, she checked her blood pressure at home and noted that her heart rate was 130. This scared her and prompted her to follow up with her PCP. Patient continues to remain essentially asymptomatic. Denies any fatigue, chest pain, shortness of breath, or palpitations. Does have some dyspnea if she over exerts herself. She denies any hyperlipidemia, diabetes, or history of sleep apnea. Denies waking up feeling fatigued. States that she is tolerating medications well, no bleeding diathesis since starting Xarelto.  She does not exercise regularly, but states that she is very active with daily activities. No difficulty with these activities. Denies any tobacco use.  Past Medical History:  Diagnosis Date  . A-fib (HCC)   . Hypertension     No past surgical history on file.  Social History   Socioeconomic History  . Marital status: Married    Spouse name: Not on file  . Number of children: 4  . Years of education: Not on file  . Highest education level: Not on file  Occupational History  . Not on file  Social Needs   . Financial resource strain: Not on file  . Food insecurity    Worry: Not on file    Inability: Not on file  . Transportation needs    Medical: Not on file    Non-medical: Not on file  Tobacco Use  . Smoking status: Never Smoker  . Smokeless tobacco: Never Used  Substance and Sexual Activity  . Alcohol use: No  . Drug use: No  . Sexual activity: Yes    Partners: Male  Lifestyle  . Physical activity    Days per week: Not on file    Minutes per session: Not on file  . Stress: Not on file  Relationships  . Social Musicianconnections    Talks on phone: Not on file    Gets together: Not on file    Attends religious service: Not on file    Active member of club or organization: Not on file    Attends meetings of clubs or organizations: Not on file    Relationship status: Not on file  . Intimate partner violence    Fear of current or ex partner: Not on file    Emotionally abused: Not on file    Physically abused: Not on file    Forced sexual activity: Not on file  Other Topics Concern  . 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  Eyes: Negative for visual disturbance.  Cardiovascular: Positive for dyspnea on exertion. Negative for chest pain, claudication, leg swelling, orthopnea, palpitations and syncope.  Respiratory: Negative for hemoptysis and wheezing.   Endocrine: Negative for cold intolerance and heat intolerance.  Hematologic/Lymphatic: Does not bruise/bleed easily.  Skin: Negative for nail changes.  Musculoskeletal: Positive for back pain. Negative for muscle weakness and myalgias.  Gastrointestinal: Negative for abdominal pain, change in bowel habit, nausea and vomiting.  Neurological: Negative for difficulty with concentration, dizziness, focal weakness and headaches.  Psychiatric/Behavioral: Negative for altered  mental status and suicidal ideas.  All other systems reviewed and are negative.     Objective:  Blood pressure 137/67, pulse 86, height 5\' 5"  (1.651 m), weight 188 lb 3.2 oz (85.4 kg), SpO2 100 %. Body mass index is 31.32 kg/m.    Physical Exam  Constitutional: She is oriented to person, place, and time. Vital signs are normal. She appears well-developed and well-nourished.  HENT:  Head: Normocephalic and atraumatic.  Neck: Normal range of motion.  Cardiovascular: Normal rate, regular rhythm, normal heart sounds and intact distal pulses.  Pulmonary/Chest: Effort normal and breath sounds normal. No accessory muscle usage. No respiratory distress.  Abdominal: Soft. Bowel sounds are normal.  Musculoskeletal: Normal range of motion.  Neurological: She is alert and oriented to person, place, and time.  Skin: Skin is warm and dry.  Vitals reviewed.  Radiology: No results found.  Laboratory examination:    CMP Latest Ref Rng & Units 07/06/2018 08/08/2017 12/05/2016  Glucose 65 - 99 mg/dL 75 83 83  BUN 8 - 27 mg/dL 14 16 17   Creatinine 0.57 - 1.00 mg/dL 0.85 0.80 0.91  Sodium 134 - 144 mmol/L 136 141 141  Potassium 3.5 - 5.2 mmol/L 3.6 4.0 3.9  Chloride 96 - 106 mmol/L 100 100 99  CO2 20 - 29 mmol/L 22 21 24   Calcium 8.7 - 10.3 mg/dL 9.9 9.9 9.7  Total Protein 6.0 - 8.5 g/dL 8.0 8.0 -  Total Bilirubin 0.0 - 1.2 mg/dL 0.4 0.4 -  Alkaline Phos 39 - 117 IU/L 61 62 -  AST 0 - 40 IU/L 26 27 -  ALT 0 - 32 IU/L 21 31 -   CBC Latest Ref Rng & Units 07/06/2018 11/27/2017 08/08/2017  WBC 3.4 - 10.8 x10E3/uL 7.4 5.0 6.4  Hemoglobin 11.1 - 15.9 g/dL 12.8 12.6 13.0  Hematocrit 34.0 - 46.6 % 41.6 38.6 41.5  Platelets 150 - 450 x10E3/uL 264 - 269   Lipid Panel     Component Value Date/Time   CHOL 226 (H) 07/06/2018 1615   TRIG 86 07/06/2018 1615   HDL 82 07/06/2018 1615   CHOLHDL 2.8 07/06/2018 1615   CHOLHDL 2.7 05/16/2016 1916   VLDL 30 05/16/2016 1916   LDLCALC 127 (H) 07/06/2018 1615    HEMOGLOBIN A1C Lab Results  Component Value Date   HGBA1C 5.7 01/26/2014   TSH Recent Labs    07/06/18 1615  TSH 1.610    PRN Meds:. There are no discontinued medications. Current Meds  Medication Sig  . atenolol-chlorthalidone (TENORETIC) 100-25 MG tablet Take 1 tablet by mouth daily.  . cyclobenzaprine (FLEXERIL) 5 MG tablet Take 1 tablet (5 mg total) by mouth 3 (three) times daily as needed for muscle spasms.  . OMEGA-3 KRILL OIL PO Take by mouth daily.  . rivaroxaban (XARELTO) 20 MG TABS tablet TAKE 1 TABLET  DAILY WITH SUPPER    Cardiac Studies:   Echocardiogram 04/22/2017: Left ventricle cavity is normal in size. Normal global wall motion. Visual EF is 55-60%. Unable to evaluate diastolic function due to A. Fibrillation. Calculated EF 50%. Left atrial cavity is severely dilated. LA is much larger than the measured AP  5.2diameter Right atrial cavity is severely dilated. Mild (Grade I) mitral regurgitation. Mild to moderate tricuspid regurgitation. Mild to moderate  pulmonary hypertension. Pulmonary artery systolic pressure is estimated at 39 mm Hg. IVC is dilated with blunted respiratory response.  Assessment:     ICD-10-CM   1. Paroxysmal atrial fibrillation (HCC)  I48.0 PCV MYOCARDIAL PERFUSION WITH LEXISCAN    PCV ECHOCARDIOGRAM COMPLETE  2. Essential hypertension  I10 PCV ECHOCARDIOGRAM COMPLETE  3. Mild obesity  E66.9      CHA2DS2-VASc Score is 2 with yearly risk of stroke of 2.2 %. HAS-Bled score is 0 and estimated major bleeding in one year is 0.6-1.13 %  Recommendations:   Patient with hypertension, mild obesity, paroxysmal atrial fibrillation, last seen by us in 2018 that was lost to follow up, referred back to us for evaluation of A fib.  Patient is essentially asymptomatic from A fib. EKG today shows coarse A fib that is rate controlled. Would recommend continuing with atenolol for now. She was noted to be in sinus rhythm at her annual visit one  year ago; therefore, patient likely has paroxysmal A fib. Previously, echocardiogram showed normal LVEF. No clinical evidence of heart failure on exam today. I would recommend repeating echocardiogram to reevaluate LVEF as well as left atrial size. She will need ischemic workup. She does have some risk factors for CAD. Will schedule for lexiscan nuclear stress test. As patient is only 63 years of age, I do feel that we should try to maintain sinus rhythm. Depending upon her test results and if she continues to be in A fib at her next follow up, will schedule for cardioversion. She will likely require antiarrhythmic therapy as well to try to maintain sinus. Will further discuss at her next office visit.   She is on Xarelto, that she tolerates well without any bleeding. She has been on anticoagulation since 2018. She is aware of CVA risk associated with A fib as well as bleeding risk with being on anticoagulation. She is agreeable to being on anticoagulation. She does have mild obesity and I have discussed correlation of obesity with A fib. Encouraged her to lose 10-15 lbs to help with this. Plan of care was discussed with her husband at their vehicle. I will see her back after the test for follow up.   *I have discussed this case with Dr. Jacinto HalimGanji and he personally examined the patient and participated in formulating the plan.*   Toniann FailAshton Haynes Yasmeen Manka, MSN, APRN, FNP-C [facility] Office: [phone] Fax: [phone]

## 2019-02-10 NOTE — Telephone Encounter (Signed)
Pt is at [facility] , Pt says yesterday she has had irreg hr of 160's she didn't feel great. Pt has been taking her xarelto, the ekg she had done today looks like aflutter and they are concerned; Please advise   [place] famliy PA

## 2019-02-10 NOTE — Progress Notes (Signed)
Acute Office Visit  Subjective:    Patient ID: Mandy Price, female    DOB: 1955/09/25, 63 y.o.   MRN: [id number]  Chief Complaint  Patient presents with  . Tachycardia    noticed it 07/14, pt stated pulse was 117, on 07/15 am it was 164 and pm 120, no meds other than prescribed    HPI Patient is in today for elevated HR and palpitations. Pt states onset over the past 2 days. Pt states no CP/no SOB/no nausea, no vomiting. Pt with h/o afib-no conversion, taking Xarelto and tenoretic but pt has not returned to see cardio since afib diagnosed in 2018. Reviewed records from visit, echo 2018 with pt. Reviewed pts home pulse and bp readings on her phone-AR highest at 164 yesterday. Pt has no known trigger for recent change.  Past Medical History:  Diagnosis Date  . A-fib ([place])   . Hypertension     No past surgical history on file.  No family history on file.  Social History   Socioeconomic History  . Marital status: Married    Spouse name: Not on file  . Number of children: 4  . Years of education: Not on file  . Highest education level: Not on file  Occupational History  . Not on file  Social Needs  . Financial resource strain: Not on file  . Food insecurity    Worry: Not on file    Inability: Not on file  . Transportation needs    Medical: Not on file    Non-medical: Not on file  Tobacco Use  . Smoking status: Never Smoker  . Smokeless tobacco: Never Used  Substance and Sexual Activity  . Alcohol use: No  . Drug use: No  . Sexual activity: Yes    Partners: Male  Lifestyle  . Physical activity    Days per week: Not on file    Minutes per session: Not on file  . Stress: Not on file  Relationships  . Social Herbalist on phone: Not on file    Gets together: Not on file    Attends religious service: Not on file    Active member of club or organization: Not on file    Attends meetings of clubs or organizations: Not on file    Relationship status:  Not on file  . Intimate partner violence    Fear of current or ex partner: Not on file    Emotionally abused: Not on file    Physically abused: Not on file    Forced sexual activity: Not on file  Other Topics Concern  . 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 atenolol-chlorthalidone (TENORETIC) 100-25 MG tablet Take 1 tablet by mouth daily. 90 tablet 1  . cyclobenzaprine (FLEXERIL) 5 MG tablet Take 1 tablet (5 mg total) by mouth 3 (three) times daily as needed for muscle spasms. 90 tablet 0  . OMEGA-3 KRILL OIL PO Take by mouth daily.    . rivaroxaban (XARELTO) 20 MG TABS tablet TAKE 1 TABLET DAILY WITH SUPPER 90 tablet 1   No facility-administered medications prior to visit.     No Known Allergies  Review of Systems  Constitutional: Negative for chills, fever and malaise/fatigue.  HENT: Negative for congestion, sinus pain and sore throat.   Eyes: Negative for blurred vision.  Respiratory: Negative for cough and shortness of breath.   Cardiovascular: Positive for palpitations. Negative for chest pain and leg swelling.  Gastrointestinal: Negative for nausea and vomiting.  Neurological: Negative for dizziness and headaches.  Psychiatric/Behavioral: Negative for depression. The patient is not nervous/anxious.        Objective:    Physical Exam  Constitutional: She is oriented to person, place, and time. She appears well-developed and well-nourished. No distress.  HENT:  Head: Normocephalic.  Eyes: Conjunctivae are normal.  Cardiovascular: Intact distal pulses.  Pulmonary/Chest: Effort normal and breath sounds normal.  Neurological: She is alert and oriented to person, place, and time.  irregular rate and irregular rhythm   BP 136/85 (BP Location: Right Arm, Patient Position: Sitting, Cuff Size: Normal)   Pulse 93   Temp 98.6 F (37 C)   Resp 18    Wt 188 lb 12.8 oz (85.6 kg)   SpO2 100%   BMI 30.71 kg/m  Wt Readings from Last 3 Encounters:  02/10/19 188 lb 3.2 oz (85.4 kg)  02/10/19 188 lb 12.8 oz (85.6 kg)  07/06/18 186 lb 12.8 oz (84.7 kg)    Health Maintenance Due  Topic Date Due  . TETANUS/TDAP  10/07/2015    Lab Results  Component Value Date   TSH 1.610 07/06/2018   Lab Results  Component Value Date   WBC 7.4 07/06/2018   HGB 12.8 07/06/2018   HCT 41.6 07/06/2018   MCV 81 07/06/2018   PLT 264 07/06/2018   Lab Results  Component Value Date   NA 136 07/06/2018   K 3.6 07/06/2018   CO2 22 07/06/2018   GLUCOSE 75 07/06/2018   BUN 14 07/06/2018   CREATININE 0.85 07/06/2018   BILITOT 0.4 07/06/2018   ALKPHOS 61 07/06/2018   AST 26 07/06/2018   ALT 21 07/06/2018   PROT 8.0 07/06/2018   ALBUMIN 4.7 07/06/2018   CALCIUM 9.9 07/06/2018   Lab Results  Component Value Date   CHOL 226 (H) 07/06/2018   Lab Results  Component Value Date   HDL 82 07/06/2018   Lab Results  Component Value Date   LDLCALC 127 (H) 07/06/2018   Lab Results  Component Value Date   TRIG 86 07/06/2018   Lab Results  Component Value Date   CHOLHDL 2.8 07/06/2018   Lab Results  Component Value Date   HGBA1C 5.7 01/26/2014       Assessment & Plan:   1. Rapid heartbeat - EKG 12-Lead-reviewed with pt and d/w cardiology for asap evaluation  2. Atrial flutter, unspecified type (HCC) Previous afib with no conversion-taking xarelto and tenoretic daily-d/w cardiology-ecg reviewed Concern for no f/u with cardio-cardio will see pt today for ongoing f/u and treatment.    Candace Ramus Mat CarneLEIGH Goran Olden, MD

## 2019-02-10 NOTE — Telephone Encounter (Signed)
Will schedule for in office visit today.  She has history of A fib noted in 2018. Previously echo, nuclear stress test, and cardioversion in 2018; however, she only had echo performed and was lost to follow up.

## 2019-02-15 ENCOUNTER — Other Ambulatory Visit: Payer: Self-pay | Admitting: Family Medicine

## 2019-02-15 DIAGNOSIS — I480 Paroxysmal atrial fibrillation: Secondary | ICD-10-CM

## 2019-02-15 DIAGNOSIS — M7918 Myalgia, other site: Secondary | ICD-10-CM

## 2019-02-15 DIAGNOSIS — I10 Essential (primary) hypertension: Secondary | ICD-10-CM

## 2019-02-15 NOTE — Telephone Encounter (Signed)
Medication Refill - Medication: atenolol-chlorthalidone (TENORETIC) 100-25 MG tablet/cyclobenzaprine (FLEXERIL) 5 MG tablet/rivaroxaban (XARELTO) 20 MG TABS tablet/Requesting a call once refills have been sent to [facility]  Has the patient contacted their pharmacy? Yes.   (Agent: If no, request that the patient contact the pharmacy for the refill.) (Agent: If yes, when and what did the pharmacy advise?)  Preferred Pharmacy (with phone number or street name):  [place], [address] [phone] (Phone) [phone] (Fax)     Agent: Please be advised that RX refills may take up to 3 business days. We ask that you follow-up with your pharmacy.

## 2019-02-16 ENCOUNTER — Other Ambulatory Visit: Payer: Self-pay | Admitting: Family Medicine

## 2019-02-16 MED ORDER — ATENOLOL-CHLORTHALIDONE 100-25 MG PO TABS: 1.0000 | ORAL_TABLET | Freq: Every day | ORAL | 1 refills | Status: DC

## 2019-02-16 MED ORDER — CYCLOBENZAPRINE HCL 5 MG PO TABS: 5.0000 mg | ORAL_TABLET | Freq: Three times a day (TID) | ORAL | 0 refills | Status: DC | PRN

## 2019-02-16 MED ORDER — RIVAROXABAN 20 MG PO TABS: ORAL_TABLET | ORAL | 1 refills | Status: DC

## 2019-02-16 NOTE — Telephone Encounter (Signed)
Requested Prescriptions   Pending Prescriptions Disp Refills  . cyclobenzaprine (FLEXERIL) 5 MG tablet 90 tablet 0    Sig: Take 1 tablet (5 mg total) by mouth 3 (three) times daily as needed for muscle spasms.   Signed Prescriptions Disp Refills  . rivaroxaban (XARELTO) 20 MG TABS tablet 90 tablet 1    Sig: TAKE 1 TABLET DAILY WITH SUPPER    Authorizing Provider: Holly Bodily, LISA L    Ordering User: Dyke Maes C  . atenolol-chlorthalidone (TENORETIC) 100-25 MG tablet 90 tablet 1    Sig: Take 1 tablet by mouth daily.    Authorizing Provider: Maryruth Hancock    Ordering User: Vanice Sarah   Last OV 02/10/2019  Last written 08/08/2017

## 2019-02-18 ENCOUNTER — Ambulatory Visit (INDEPENDENT_AMBULATORY_CARE_PROVIDER_SITE_OTHER): Payer: Commercial Managed Care - PPO

## 2019-02-18 ENCOUNTER — Other Ambulatory Visit: Payer: Self-pay

## 2019-02-18 DIAGNOSIS — I480 Paroxysmal atrial fibrillation: Secondary | ICD-10-CM | POA: Diagnosis not present

## 2019-03-10 ENCOUNTER — Other Ambulatory Visit: Payer: Commercial Managed Care - PPO

## 2019-03-15 ENCOUNTER — Ambulatory Visit: Payer: Commercial Managed Care - PPO | Admitting: Family Medicine

## 2019-03-16 ENCOUNTER — Ambulatory Visit: Payer: Commercial Managed Care - PPO | Admitting: Family Medicine

## 2019-03-16 ENCOUNTER — Encounter: Payer: Commercial Managed Care - PPO | Admitting: Registered Nurse

## 2019-03-17 ENCOUNTER — Telehealth: Payer: Commercial Managed Care - PPO | Admitting: Cardiology

## 2019-03-25 ENCOUNTER — Ambulatory Visit (INDEPENDENT_AMBULATORY_CARE_PROVIDER_SITE_OTHER): Payer: Commercial Managed Care - PPO

## 2019-03-25 ENCOUNTER — Other Ambulatory Visit: Payer: Self-pay

## 2019-03-25 DIAGNOSIS — I10 Essential (primary) hypertension: Secondary | ICD-10-CM | POA: Diagnosis not present

## 2019-03-25 DIAGNOSIS — I480 Paroxysmal atrial fibrillation: Secondary | ICD-10-CM

## 2019-04-01 ENCOUNTER — Telehealth: Payer: Commercial Managed Care - PPO | Admitting: Cardiology

## 2019-04-01 NOTE — Progress Notes (Deleted)
Primary Physician:  Wandra Feinstein, MD   Patient ID: Mandy Price, female    DOB: 01/17/1956, 63 y.o.   MRN: [id number]  Subjective:    No chief complaint on file.   HPI: Mandy Price  is a 63 y.o. female  with hypertension, last seen by [place] in 2018 for new onset atrial fibrillation and was started on anticoagulation at that time, referred back to [place] today for Atrial flutter noted on EKG in their office.   When patient was last seen in 2018, she was started on diltiazem for rate control, but she did not start.  She underwent echocardiogram that showed normal LVEF.  We had recommended undergoing Lexiscan nuclear stress testing to exclude ischemic etiology for A. fib as well as undergoing cardioversion.  Patient did not undergo these as she did not feel that she needed them and was lost to follow up.  She reports 2 days ago, she checked her blood pressure at home and noted that her heart rate was 130. This scared her and prompted her to follow up with her PCP. Patient continues to remain essentially asymptomatic. Denies any fatigue, chest pain, shortness of breath, or palpitations. Does have some dyspnea if she over exerts herself. She denies any hyperlipidemia, diabetes, or history of sleep apnea. Denies waking up feeling fatigued. States that she is tolerating medications well, no bleeding diathesis since starting Xarelto.  She does not exercise regularly, but states that she is very active with daily activities. No difficulty with these activities. Denies any tobacco use.  Past Medical History:  Diagnosis Date  . A-fib (HCC)   . Hypertension     No past surgical history on file.  Social History   Socioeconomic History  . Marital status: Married    Spouse name: Not on file  . Number of children: 4  . Years of education: Not on file  . Highest education level: Not on file  Occupational History  . Not on file  Social Needs  . Financial resource strain: Not on file  . Food  insecurity    Worry: Not on file    Inability: Not on file  . Transportation needs    Medical: Not on file    Non-medical: Not on file  Tobacco Use  . Smoking status: Never Smoker  . Smokeless tobacco: Never Used  Substance and Sexual Activity  . Alcohol use: No  . Drug use: No  . Sexual activity: Yes    Partners: Male  Lifestyle  . Physical activity    Days per week: Not on file    Minutes per session: Not on file  . Stress: Not on file  Relationships  . Social Musician on phone: Not on file    Gets together: Not on file    Attends religious service: Not on file    Active member of club or organization: Not on file    Attends meetings of clubs or organizations: Not on file    Relationship status: Not on file  . Intimate partner violence    Fear of current or ex partner: Not on file    Emotionally abused: Not on file    Physically abused: Not on file    Forced sexual activity: Not on file  Other Topics Concern  . 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  Eyes: Negative for visual disturbance.  Cardiovascular: Positive for dyspnea on exertion. Negative for chest pain, claudication, leg swelling, orthopnea, palpitations and syncope.  Respiratory: Negative for hemoptysis and wheezing.   Endocrine: Negative for cold intolerance and heat intolerance.  Hematologic/Lymphatic: Does not bruise/bleed easily.  Skin: Negative for nail changes.  Musculoskeletal: Positive for back pain. Negative for muscle weakness and myalgias.  Gastrointestinal: Negative for abdominal pain, change in bowel habit, nausea and vomiting.  Neurological: Negative for difficulty with concentration, dizziness, focal weakness and headaches.  Psychiatric/Behavioral: Negative for altered mental status and suicidal ideas.  All other  systems reviewed and are negative.     Objective:  There were no vitals taken for this visit. There is no height or weight on file to calculate BMI.    Physical Exam  Constitutional: She is oriented to person, place, and time. Vital signs are normal. She appears well-developed and well-nourished.  HENT:  Head: Normocephalic and atraumatic.  Neck: Normal range of motion.  Cardiovascular: Normal rate, regular rhythm, normal heart sounds and intact distal pulses.  Pulmonary/Chest: Effort normal and breath sounds normal. No accessory muscle usage. No respiratory distress.  Abdominal: Soft. Bowel sounds are normal.  Musculoskeletal: Normal range of motion.  Neurological: She is alert and oriented to person, place, and time.  Skin: Skin is warm and dry.  Vitals reviewed.  Radiology: No results found.  Laboratory examination:    CMP Latest Ref Rng & Units 07/06/2018 08/08/2017 12/05/2016  Glucose 65 - 99 mg/dL 75 83 83  BUN 8 - 27 mg/dL 14 16 17   Creatinine 0.57 - 1.00 mg/dL 1.610.85 0.960.80 0.450.91  Sodium 134 - 144 mmol/L 136 141 141  Potassium 3.5 - 5.2 mmol/L 3.6 4.0 3.9  Chloride 96 - 106 mmol/L 100 100 99  CO2 20 - 29 mmol/L 22 21 24   Calcium 8.7 - 10.3 mg/dL 9.9 9.9 9.7  Total Protein 6.0 - 8.5 g/dL 8.0 8.0 -  Total Bilirubin 0.0 - 1.2 mg/dL 0.4 0.4 -  Alkaline Phos 39 - 117 IU/L 61 62 -  AST 0 - 40 IU/L 26 27 -  ALT 0 - 32 IU/L 21 31 -   CBC Latest Ref Rng & Units 07/06/2018 11/27/2017 08/08/2017  WBC 3.4 - 10.8 x10E3/uL 7.4 5.0 6.4  Hemoglobin 11.1 - 15.9 g/dL 40.912.8 81.112.6 91.413.0  Hematocrit 34.0 - 46.6 % 41.6 38.6 41.5  Platelets 150 - 450 x10E3/uL 264 - 269   Lipid Panel     Component Value Date/Time   CHOL 226 (H) 07/06/2018 1615   TRIG 86 07/06/2018 1615   HDL 82 07/06/2018 1615   CHOLHDL 2.8 07/06/2018 1615   CHOLHDL 2.7 05/16/2016 1916   VLDL 30 05/16/2016 1916   LDLCALC 127 (H) 07/06/2018 1615   HEMOGLOBIN A1C Lab Results  Component Value Date   HGBA1C 5.7 01/26/2014    TSH Recent Labs    07/06/18 1615  TSH 1.610    PRN Meds:. There are no discontinued medications. No outpatient medications have been marked as taking for the 04/01/19 encounter (Appointment) with Toniann FailKelley, Khairi Garman Haynes, NP.    Cardiac Studies:   Echocardiogram 03/25/2019: Left ventricle cavity is normal in size. Normal left ventricular wall thickness. Normal LV systolic function with EF 55%. Normal global wall motion. Unable to evaluate diastolic function due to atrial fibrillation. Calculated EF 55%. Left atrial cavity is mildly dilated. Mild (Grade I) mitral regurgitation. Mild to moderate tricuspid  regurgitation. Estimated pulmonary artery systolic pressure is 26 mmHg. Compared to previous study in 2018, PA pressures have improved.   Lexiscan Sestamibi stress test 02/18/2019: Lexiscan stress test was performed. Stress EKG is non-diagnostic, as this is pharmacological stress test. Myocardial perfusion imaging is normal. LVEF 58%. Low risk study.  Assessment:   No diagnosis found.   CHA2DS2-VASc Score is 2 with yearly risk of stroke of 2.2 %. HAS-Bled score is 0 and estimated major bleeding in one year is 0.6-1.13 %  Recommendations:   Patient with hypertension, mild obesity, paroxysmal atrial fibrillation, last seen by [place] in 2018 that was lost to follow up, referred back to [place] for evaluation of A fib.  Patient is essentially asymptomatic from A fib. EKG today shows coarse A fib that is rate controlled. Would recommend continuing with atenolol for now. She was noted to be in sinus rhythm at her annual visit one year ago; therefore, patient likely has paroxysmal A fib. Previously, echocardiogram showed normal LVEF. No clinical evidence of heart failure on exam today. I would recommend repeating echocardiogram to reevaluate LVEF as well as left atrial size. She will need ischemic workup. She does have some risk factors for CAD. Will schedule for lexiscan nuclear stress test. As  patient is only 63 years of age, I do feel that we should try to maintain sinus rhythm. Depending upon her test results and if she continues to be in A fib at her next follow up, will schedule for cardioversion. She will likely require antiarrhythmic therapy as well to try to maintain sinus. Will further discuss at her next office visit.   She is on Xarelto, that she tolerates well without any bleeding. She has been on anticoagulation since 2018. She is aware of CVA risk associated with A fib as well as bleeding risk with being on anticoagulation. She is agreeable to being on anticoagulation. She does have mild obesity and I have discussed correlation of obesity with A fib. Encouraged her to lose 10-15 lbs to help with this. Plan of care was discussed with her husband at their vehicle. I will see her back after the test for follow up.   *I have discussed this case with Dr. Einar Gip and he personally examined the patient and participated in formulating the plan.*   Miquel Dunn, MSN, APRN, FNP-C [facility]. [place] Office: [phone] Fax: [phone]

## 2019-04-06 ENCOUNTER — Other Ambulatory Visit: Payer: Self-pay

## 2019-04-06 ENCOUNTER — Encounter: Payer: Self-pay | Admitting: Cardiology

## 2019-04-06 ENCOUNTER — Ambulatory Visit (INDEPENDENT_AMBULATORY_CARE_PROVIDER_SITE_OTHER): Payer: Commercial Managed Care - PPO | Admitting: Cardiology

## 2019-04-06 VITALS — BP 139/75 | HR 109 | Ht 66.0 in | Wt 185.0 lb

## 2019-04-06 DIAGNOSIS — E669 Obesity, unspecified: Secondary | ICD-10-CM | POA: Diagnosis not present

## 2019-04-06 DIAGNOSIS — I10 Essential (primary) hypertension: Secondary | ICD-10-CM

## 2019-04-06 DIAGNOSIS — I480 Paroxysmal atrial fibrillation: Secondary | ICD-10-CM

## 2019-04-06 NOTE — Progress Notes (Signed)
Primary Physician:  Shawnee Knapp, MD   Patient ID: Mandy Price, female    DOB: 1955-11-09, 63 y.o.   MRN: [id number]  Subjective:    Chief Complaint  Patient presents with   Atrial Fibrillation   Follow-up    HPI: Mandy Price  is a 63 y.o. female  with hypertension, recently seen for follow up on atrial fibrillation on PCP EKG.   Patient has recently noted elevated heart rate while checking her blood pressure at home and was found to have neutropenia.  With her PCP.  She is essentially asymptomatic in regards to A. fib.  Given her risk factors, underwent Lexiscan nuclear stress testing and echocardiogram and now presents for follow-up.  Continues to feel well without any complaints. She has been on Xarelto since 2018, tolerating this without any bleeding diathesis. Denies any fatigue, chest pain, shortness of breath, or palpitations. Does have some dyspnea if she over exerts herself. She denies any hyperlipidemia, diabetes, or history of sleep apnea.   She does not exercise regularly, but states that she is very active with daily activities. No difficulty with these activities. Denies any tobacco use.  Past Medical History:  Diagnosis Date   A-fib [facility])    Hypertension     History reviewed. No pertinent surgical history.  Social History   Socioeconomic History   Marital status: Married    Spouse name: Not on file   Number of children: 4   Years of education: Not on file   Highest education level: Not on file  Occupational History   Not on file  Social Needs   Financial resource strain: Not on file   Food insecurity    Worry: Not on file    Inability: Not on file   Transportation needs    Medical: Not on file    Non-medical: Not on file  Tobacco Use   Smoking status: Never Smoker   Smokeless tobacco: Never Used  Substance and Sexual Activity   Alcohol use: No   Drug use: No   Sexual activity: Yes    Partners: Male  Lifestyle    Physical activity    Days per week: Not on file    Minutes per session: Not on file   Stress: Not on file  Relationships   Social connections    Talks on phone: Not on file    Gets together: Not on file    Attends religious service: Not on file    Active member of club or organization: Not on file    Attends meetings of clubs or organizations: Not on file    Relationship status: Not on file   Intimate partner violence    Fear of current or ex partner: Not on file    Emotionally abused: Not on file    Physically abused: Not on file    Forced sexual activity: Not on file  Other Topics Concern   Not on file  Social History Narrative   Married   4 grown children   Hodgkins    Review of Systems  Constitution: Negative for decreased appetite, malaise/fatigue, weight gain and weight loss.  Eyes: Negative for visual disturbance.  Cardiovascular: Positive for dyspnea on exertion. Negative for chest pain, claudication, leg swelling, orthopnea, palpitations and syncope.  Respiratory: Negative for hemoptysis and wheezing.   Endocrine: Negative for cold intolerance and heat intolerance.  Hematologic/Lymphatic: Does not bruise/bleed easily.  Skin: Negative for nail changes.  Musculoskeletal: Positive for back pain.  Negative for muscle weakness and myalgias.  Gastrointestinal: Negative for abdominal pain, change in bowel habit, nausea and vomiting.  Neurological: Negative for difficulty with concentration, dizziness, focal weakness and headaches.  Psychiatric/Behavioral: Negative for altered mental status and suicidal ideas.  All other systems reviewed and are negative.     Objective:  Blood pressure 139/75, pulse (!) 109, height 5\' 6"  (1.676 m), weight 185 lb (83.9 kg), SpO2 99 %. Body mass index is 29.86 kg/m.    Physical Exam  Constitutional: She is oriented to person, place, and time. Vital signs are normal. She appears well-developed and well-nourished.  HENT:  Head:  Normocephalic and atraumatic.  Neck: Normal range of motion.  Cardiovascular: Normal rate, normal heart sounds and intact distal pulses. An irregularly irregular rhythm present.  Pulmonary/Chest: Effort normal and breath sounds normal. No accessory muscle usage. No respiratory distress.  Abdominal: Soft. Bowel sounds are normal.  Musculoskeletal: Normal range of motion.  Neurological: She is alert and oriented to person, place, and time.  Skin: Skin is warm and dry.  Vitals reviewed.  Radiology: No results found.  Laboratory examination:    CMP Latest Ref Rng & Units 07/06/2018 08/08/2017 12/05/2016  Glucose 65 - 99 mg/dL 75 83 83  BUN 8 - 27 mg/dL 14 16 17   Creatinine 0.57 - 1.00 mg/dL 1.610.85 0.960.80 0.450.91  Sodium 134 - 144 mmol/L 136 141 141  Potassium 3.5 - 5.2 mmol/L 3.6 4.0 3.9  Chloride 96 - 106 mmol/L 100 100 99  CO2 20 - 29 mmol/L 22 21 24   Calcium 8.7 - 10.3 mg/dL 9.9 9.9 9.7  Total Protein 6.0 - 8.5 g/dL 8.0 8.0 -  Total Bilirubin 0.0 - 1.2 mg/dL 0.4 0.4 -  Alkaline Phos 39 - 117 IU/L 61 62 -  AST 0 - 40 IU/L 26 27 -  ALT 0 - 32 IU/L 21 31 -   CBC Latest Ref Rng & Units 07/06/2018 11/27/2017 08/08/2017  WBC 3.4 - 10.8 x10E3/uL 7.4 5.0 6.4  Hemoglobin 11.1 - 15.9 g/dL 40.912.8 81.112.6 91.413.0  Hematocrit 34.0 - 46.6 % 41.6 38.6 41.5  Platelets 150 - 450 x10E3/uL 264 - 269   Lipid Panel     Component Value Date/Time   CHOL 226 (H) 07/06/2018 1615   TRIG 86 07/06/2018 1615   HDL 82 07/06/2018 1615   CHOLHDL 2.8 07/06/2018 1615   CHOLHDL 2.7 05/16/2016 1916   VLDL 30 05/16/2016 1916   LDLCALC 127 (H) 07/06/2018 1615   HEMOGLOBIN A1C Lab Results  Component Value Date   HGBA1C 5.7 01/26/2014   TSH Recent Labs    07/06/18 1615  TSH 1.610    PRN Meds:. There are no discontinued medications. Current Meds  Medication Sig   atenolol-chlorthalidone (TENORETIC) 100-25 MG tablet Take 1 tablet by mouth daily.   cyclobenzaprine (FLEXERIL) 5 MG tablet Take 1 tablet (5 mg total)  by mouth 3 (three) times daily as needed for muscle spasms.   OMEGA-3 KRILL OIL PO Take by mouth daily.   rivaroxaban (XARELTO) 20 MG TABS tablet TAKE 1 TABLET DAILY WITH SUPPER    Cardiac Studies:   Echocardiogram 03/25/2019: Left ventricle cavity is normal in size. Normal left ventricular wall thickness. Normal LV systolic function with EF 55%. Normal global wall motion. Unable to evaluate diastolic function due to atrial fibrillation. Calculated EF 55%. Left atrial cavity is mildly dilated. Mild (Grade I) mitral regurgitation. Mild to moderate tricuspid regurgitation. Estimated pulmonary artery systolic pressure is 26 mmHg. Compared  to previous study in 2018, PA pressures have improved.   Lexiscan Sestamibi stress test 02/18/2019: Lexiscan stress test was performed. Stress EKG is non-diagnostic, as this is pharmacological stress test. Myocardial perfusion imaging is normal. LVEF 58%. Low risk study.  Assessment:     ICD-10-CM   1. Paroxysmal atrial fibrillation (HCC)  I48.0 EKG 12-Lead     CHA2DS2-VASc Score is 2 with yearly risk of stroke of 2.2 %. HAS-Bled score is 0 and estimated major bleeding in one year is 0.6-1.13 %  Recommendations:   I discussed recently obtained stress test and echocardiogram results with the patient, has normal LVEF.  No evidence of myocardial ischemia by stress testing.  She continues to be in atrial fibrillation by physical exam, would not allow [place] to perform EKG today as she was wearing a dress.  She is essentially asymptomatic from atrial fibrillation, but given her young age, do feel that we should try to achieve sinus rhythm.  As she has likely been in atrial fibrillation for longstanding period of time, feel that cardioversion alone would likely be unsuccessful.  Feel that she would have better chances of achieving sinus rhythm with antiarrhythmic therapy and cardioversion.  Given her low risk stress test, will start flecainide 50 mg twice daily  and plan for cardioversion in the next few weeks. Schedule for Direct current cardioversion. I have discussed regarding risks benefits rate control vs rhythm control with the patient. Patient understands cardiac arrest and need for CPR, aspiration pneumonia, but not limited to these. Patient is willing. She is aware to continue with her current medications including Xarelto.  She is on Xarelto, that she tolerates well without any bleeding. She has been on anticoagulation since 2018. She is aware of CVA risk associated with A fib as well as bleeding risk with being on anticoagulation. She is agreeable to being on anticoagulation. She does have mild obesity and I have discussed correlation of obesity with A fib. Encouraged her to lose 10-15 lbs to help with this. Plan of care was discussed with her husband at their vehicle.  Patient is reluctant to make a decision today, and wishes to discuss further with her husband and let us know. I will tentatively see her back in approximately 4 weeks after cardioversion.   Addendum: Patient was later contacted and reports that she is now willing to start medication and undergo cardioversion.  Will start flecainide 50 mg twice daily as originally planned and scheduled for cardioversion at next available date.  Encouraged her to contact me for any problems or new symptoms.  We will see her back as originally planned in 4 weeks for follow-up after her cardioversion.   *I have discussed this case with Dr. Jacinto Halim and he participated in formulating the plan.*   Toniann Fail, MSN, APRN, FNP-C [facility]. [place] Office: [phone] Fax: [phone]

## 2019-04-13 ENCOUNTER — Telehealth: Payer: Self-pay | Admitting: Family Medicine

## 2019-04-13 NOTE — Telephone Encounter (Signed)
Pt is needing refill/ does not have time for [facility]  needs someone to call them before thay can fill and mail  prescription. Phone number to call  [phone]

## 2019-04-14 ENCOUNTER — Encounter: Payer: Self-pay | Admitting: Cardiology

## 2019-04-14 MED ORDER — FLECAINIDE ACETATE 50 MG PO TABS: 50.0000 mg | ORAL_TABLET | Freq: Two times a day (BID) | ORAL | 1 refills | Status: DC

## 2019-04-15 ENCOUNTER — Telehealth: Payer: Self-pay | Admitting: Cardiology

## 2019-04-15 NOTE — Telephone Encounter (Signed)
Spoke with pt picked up flecainde 50 mg one tab bid from [facility] prescribed by Jeri Lager, NP.

## 2019-04-15 NOTE — Telephone Encounter (Signed)
Patient daughter called for clarification on the plan of care and with questions regarding indication for the procedure. Plan of care was discussed with her as well my indications for antiarrhythmic therapy in combination with cardioversion. She verbalized understanding. Encouraged to call back with any further questions.

## 2019-04-19 ENCOUNTER — Telehealth: Payer: Commercial Managed Care - PPO | Admitting: Adult Health Nurse Practitioner

## 2019-04-19 NOTE — Telephone Encounter (Signed)
Pt states we need to do PA for refill of Xarelto.  Asks that we speak to [facility] at [phone].  The letter she has refers to #[id number] but not sure what this number is for.    Thank you.

## 2019-04-27 ENCOUNTER — Other Ambulatory Visit: Payer: Self-pay | Admitting: Cardiology

## 2019-04-27 DIAGNOSIS — I480 Paroxysmal atrial fibrillation: Secondary | ICD-10-CM

## 2019-04-27 NOTE — Progress Notes (Signed)
Bmp covid orders placed

## 2019-04-30 LAB — BASIC METABOLIC PANEL
BUN/Creatinine Ratio: 9 — ABNORMAL LOW (ref 12–28)
BUN: 8 mg/dL (ref 8–27)
CO2: 26 mmol/L (ref 20–29)
Calcium: 9.5 mg/dL (ref 8.7–10.3)
Chloride: 100 mmol/L (ref 96–106)
Creatinine, Ser: 0.89 mg/dL (ref 0.57–1.00)
GFR calc Af Amer: 80 mL/min/{1.73_m2} (ref >59)
GFR calc non Af Amer: 70 mL/min/{1.73_m2} (ref >59)
Glucose: 99 mg/dL (ref 65–99)
Potassium: 3.7 mmol/L (ref 3.5–5.2)
Sodium: 139 mmol/L (ref 134–144)

## 2019-05-04 ENCOUNTER — Other Ambulatory Visit: Payer: Self-pay

## 2019-05-04 ENCOUNTER — Encounter: Payer: Self-pay | Admitting: Cardiology

## 2019-05-04 ENCOUNTER — Ambulatory Visit (INDEPENDENT_AMBULATORY_CARE_PROVIDER_SITE_OTHER): Payer: Commercial Managed Care - PPO | Admitting: Cardiology

## 2019-05-04 VITALS — BP 128/72 | HR 75 | Temp 98.0°F | Ht 65.0 in | Wt 185.8 lb

## 2019-05-04 DIAGNOSIS — E669 Obesity, unspecified: Secondary | ICD-10-CM | POA: Diagnosis not present

## 2019-05-04 DIAGNOSIS — I10 Essential (primary) hypertension: Secondary | ICD-10-CM

## 2019-05-04 DIAGNOSIS — I480 Paroxysmal atrial fibrillation: Secondary | ICD-10-CM | POA: Diagnosis not present

## 2019-05-04 DIAGNOSIS — I4891 Unspecified atrial fibrillation: Secondary | ICD-10-CM | POA: Diagnosis not present

## 2019-05-04 NOTE — Progress Notes (Signed)
Primary Physician:  Doristine Bosworth, MD   Patient ID: Mandy Price, female    DOB: [birth year], 63 y.o.   MRN: [id number]  Subjective:    Chief Complaint  Patient presents with  . Atrial Fibrillation  . Follow-up    4wk    HPI: Mandy Price  is a 63 y.o. female  with hypertension, recently seen for follow up on atrial fibrillation on PCP EKG.   She is essentially asymptomatic in regards to A. fib.  She underwent Lexiscan nuclear stress testing in July 2020 that was considered low risk study.  Echocardiogram was also performed in August 2020 that revealed normal LVEF.  She continued to be in A. fib, and given her young age, I had recommended starting antiarrhythmic therapy and proceeding with cardioversion to try to obtain sinus rhythm.  Patient was very reluctant to proceeding with the procedure, but is now willing.  She wanted to keep this appointment prior to her cardioversion to see if she was still in A. fib since starting on low-dose flecainide.  She now presents for follow-up.  She is tolerating medications well.  She does have occasional palpitations, but noticed that her heart rate has been in the normal range of 70-80.  Feels that this is related to anxiety and she is very nervous about upcoming cardioversion. Denies any fatigue, chest pain, shortness of breath, or palpitations. Does have some dyspnea if she over exerts herself. She denies any hyperlipidemia, diabetes, or history of sleep apnea.   She does not exercise regularly, but states that she is very active with daily activities. No difficulty with these activities. Denies any tobacco use.  Past Medical History:  Diagnosis Date  . A-fib (HCC)   . Hypertension     Past Surgical History:  Procedure Laterality Date  . None Surgeries      Social History   Socioeconomic History  . Marital status: Married    Spouse name: Not on file  . Number of children: 4  . Years of education: Not on file  . Highest  education level: Not on file  Occupational History  . Not on file  Social Needs  . Financial resource strain: Not on file  . Food insecurity    Worry: Not on file    Inability: Not on file  . Transportation needs    Medical: Not on file    Non-medical: Not on file  Tobacco Use  . Smoking status: Never Smoker  . Smokeless tobacco: Never Used  Substance and Sexual Activity  . Alcohol use: No  . Drug use: No  . Sexual activity: Yes    Partners: Male  Lifestyle  . Physical activity    Days per week: Not on file    Minutes per session: Not on file  . Stress: Not on file  Relationships  . Social Musician on phone: Not on file    Gets together: Not on file    Attends religious service: Not on file    Active member of club or organization: Not on file    Attends meetings of clubs or organizations: Not on file    Relationship status: Not on file  . Intimate partner violence    Fear of current or ex partner: Not on file    Emotionally abused: Not on file    Physically abused: Not on file    Forced sexual activity: Not on file  Other Topics Concern  . 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  Eyes: Negative for visual disturbance.  Cardiovascular: Positive for dyspnea on exertion. Negative for chest pain, claudication, leg swelling, orthopnea, palpitations and syncope.  Respiratory: Negative for hemoptysis and wheezing.   Endocrine: Negative for cold intolerance and heat intolerance.  Hematologic/Lymphatic: Does not bruise/bleed easily.  Skin: Negative for nail changes.  Musculoskeletal: Positive for back pain. Negative for muscle weakness and myalgias.  Gastrointestinal: Negative for abdominal pain, change in bowel habit, nausea and vomiting.  Neurological: Negative for difficulty with concentration,  dizziness, focal weakness and headaches.  Psychiatric/Behavioral: Negative for altered mental status and suicidal ideas.  All other systems reviewed and are negative.     Objective:  Blood pressure 128/72, pulse 75, temperature 98 F (36.7 C), height 5\' 5"  (1.651 m), weight 185 lb 12.8 oz (84.3 kg), SpO2 99 %. Body mass index is 30.92 kg/m.    Physical Exam  Constitutional: She is oriented to person, place, and time. Vital signs are normal. She appears well-developed and well-nourished.  HENT:  Head: Normocephalic and atraumatic.  Neck: Normal range of motion.  Cardiovascular: Normal rate, regular rhythm, normal heart sounds and intact distal pulses.  Pulmonary/Chest: Effort normal and breath sounds normal. No accessory muscle usage. No respiratory distress.  Abdominal: Soft. Bowel sounds are normal.  Musculoskeletal: Normal range of motion.  Neurological: She is alert and oriented to person, place, and time.  Skin: Skin is warm and dry.  Vitals reviewed.  Radiology: No results found.  Laboratory examination:    CMP Latest Ref Rng & Units 04/29/2019 07/06/2018 08/08/2017  Glucose 65 - 99 mg/dL 99 75 83  BUN 8 - 27 mg/dL 8 14 16   Creatinine 0.57 - 1.00 mg/dL 1.610.89 0.960.85 0.450.80  Sodium 134 - 144 mmol/L 139 136 141  Potassium 3.5 - 5.2 mmol/L 3.7 3.6 4.0  Chloride 96 - 106 mmol/L 100 100 100  CO2 20 - 29 mmol/L 26 22 21   Calcium 8.7 - 10.3 mg/dL 9.5 9.9 9.9  Total Protein 6.0 - 8.5 g/dL - 8.0 8.0  Total Bilirubin 0.0 - 1.2 mg/dL - 0.4 0.4  Alkaline Phos 39 - 117 IU/L - 61 62  AST 0 - 40 IU/L - 26 27  ALT 0 - 32 IU/L - 21 31   CBC Latest Ref Rng & Units 07/06/2018 11/27/2017 08/08/2017  WBC 3.4 - 10.8 x10E3/uL 7.4 5.0 6.4  Hemoglobin 11.1 - 15.9 g/dL 40.912.8 81.112.6 91.413.0  Hematocrit 34.0 - 46.6 % 41.6 38.6 41.5  Platelets 150 - 450 x10E3/uL 264 - 269   Lipid Panel     Component Value Date/Time   CHOL 226 (H) 07/06/2018 1615   TRIG 86 07/06/2018 1615   HDL 82 07/06/2018 1615    CHOLHDL 2.8 07/06/2018 1615   CHOLHDL 2.7 05/16/2016 1916   VLDL 30 05/16/2016 1916   LDLCALC 127 (H) 07/06/2018 1615   HEMOGLOBIN A1C Lab Results  Component Value Date   HGBA1C 5.7 01/26/2014   TSH Recent Labs    07/06/18 1615  TSH 1.610    PRN Meds:. There are no discontinued medications. Current Meds  Medication Sig  . atenolol-chlorthalidone (TENORETIC) 100-25 MG tablet Take 1 tablet by mouth daily.  . cyclobenzaprine (FLEXERIL) 5 MG tablet Take 1 tablet (5 mg total) by mouth 3 (three) times daily as  needed for muscle spasms.  . flecainide (TAMBOCOR) 50 MG tablet Take 1 tablet (50 mg total) by mouth 2 (two) times daily.  . OMEGA-3 KRILL OIL PO Take by mouth daily.  . rivaroxaban (XARELTO) 20 MG TABS tablet TAKE 1 TABLET DAILY WITH SUPPER    Cardiac Studies:   Echocardiogram 03/25/2019: Left ventricle cavity is normal in size. Normal left ventricular wall thickness. Normal LV systolic function with EF 55%. Normal global wall motion. Unable to evaluate diastolic function due to atrial fibrillation. Calculated EF 55%. Left atrial cavity is mildly dilated. Mild (Grade I) mitral regurgitation. Mild to moderate tricuspid regurgitation. Estimated pulmonary artery systolic pressure is 26 mmHg. Compared to previous study in 2018, PA pressures have improved.   Lexiscan Sestamibi stress test 02/18/2019: Lexiscan stress test was performed. Stress EKG is non-diagnostic, as this is pharmacological stress test. Myocardial perfusion imaging is normal. LVEF 58%. Low risk study.  Assessment:     ICD-10-CM   1. Paroxysmal atrial fibrillation (HCC)  I48.0 EKG 12-Lead  2. Essential hypertension  I10   3. Mild obesity  E66.9    EKG 05/04/2019: Normal sinus rhythm at 74 bpm, left atrial abnormality, normal axis, diffuse non-specific T wave abnormality, cannot exclude ischemia.   CHA2DS2-VASc Score is 2 with yearly risk of stroke of 2.2 %. HAS-Bled score is 0 and estimated major  bleeding in one year is 0.6-1.13 %  Recommendations:   Fortunately, patient has been able to convert since starting flecainide.  She is feeling well with only occasional palpitations that she feels is actually related to anxiety as her heart rate is generally normal during this time.  She manually checks her pulse as well and is regular rhythm.  We will continue with present medications.  She will need treadmill stress testing since being on flecainide and will arrange for this in the next few weeks.  No bleeding diathesis on anticoagulation.    Blood pressure is well controlled.  I have encouraged her to continue to work on her risk factors particularly with her obesity, diet modifications were discussed.  I will plan to see her back in 2 months for follow-up, but encouraged her to contact me sooner if needed.   Miquel Dunn, MSN, APRN, FNP-C [facility]. [place] Office: [phone] Fax: [phone]

## 2019-05-06 ENCOUNTER — Other Ambulatory Visit (HOSPITAL_COMMUNITY): Payer: Self-pay

## 2019-05-10 ENCOUNTER — Encounter (HOSPITAL_COMMUNITY): Admission: RE | Payer: Self-pay | Source: Home / Self Care

## 2019-05-10 ENCOUNTER — Ambulatory Visit (HOSPITAL_COMMUNITY)
Admission: RE | Admit: 2019-05-10 | Payer: Commercial Managed Care - PPO | Source: Home / Self Care | Admitting: Cardiology

## 2019-05-10 SURGERY — CARDIOVERSION
Anesthesia: General

## 2019-05-16 ENCOUNTER — Ambulatory Visit: Payer: Commercial Managed Care - PPO | Admitting: Registered Nurse

## 2019-05-16 ENCOUNTER — Encounter: Payer: Self-pay | Admitting: Registered Nurse

## 2019-05-16 ENCOUNTER — Other Ambulatory Visit: Payer: Self-pay

## 2019-05-16 VITALS — BP 139/83 | HR 110 | Temp 98.2°F | Ht 65.0 in | Wt 187.0 lb

## 2019-05-16 DIAGNOSIS — Z23 Encounter for immunization: Secondary | ICD-10-CM

## 2019-05-16 DIAGNOSIS — Z79899 Other long term (current) drug therapy: Secondary | ICD-10-CM | POA: Diagnosis not present

## 2019-05-16 DIAGNOSIS — Z7689 Persons encountering health services in other specified circumstances: Secondary | ICD-10-CM

## 2019-05-16 NOTE — Progress Notes (Signed)
Established Patient Office Visit  Subjective:  Patient ID: Mandy Price, female    DOB: [birth year]  Age: 63 y.o. MRN: [id number]  CC:  Chief Complaint  Patient presents with  . Transitions Of Care    and get flu and tdap. get to know eachother    HPI Mandy Price presents for visit to establish care.  She is formerly a patient of Dr. Clelia Croft, and has since seen Dr. Judee Clara for tachycardia. She has a history of afib, aflutter, and HTN. She states that she had been scheduled for a cardioversion by her cardiologist, Dr. Jacinto Halim, but this was canceled after it was determined that her Tambocor was achieving effect.   She is taking atenolol-chlorthalidone 100-25mg  PO qd, flecainide 50mg  PO bid, and rivaroxaban 20mg  PO qd. Good effect from each. Has had recent labs that show no major concerns.   Feels well today. Stressed from , has not worked since March.   Past Medical History:  Diagnosis Date  . A-fib (HCC)   . Hypertension     Past Surgical History:  Procedure Laterality Date  . None Surgeries      History reviewed. No pertinent family history.  Social History   Socioeconomic History  . Marital status: Married    Spouse name: Not on file  . Number of children: 4  . Years of education: Not on file  . Highest education level: Not on file  Occupational History  . Not on file  Social Needs  . Financial resource strain: Not hard at all  . Food insecurity    Worry: Never true    Inability: Never true  . Transportation needs    Medical: No    Non-medical: No  Tobacco Use  . Smoking status: Never Smoker  . Smokeless tobacco: Never Used  Substance and Sexual Activity  . Alcohol use: No  . Drug use: No  . Sexual activity: Yes    Partners: Male  Lifestyle  . Physical activity    Days per week: 3 days    Minutes per session: 30 min  . Stress: Only a little  Relationships  . Social [employer] on phone: Three times a week    Gets together: Twice a  week    Attends religious service: Patient refused    Active member of club or organization: Patient refused    Attends meetings of clubs or organizations: Patient refused    Relationship status: Married  . Intimate partner violence    Fear of current or ex partner: No    Emotionally abused: No    Physically abused: No    Forced sexual activity: No  Other Topics Concern  . 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 atenolol-chlorthalidone (TENORETIC) 100-25 MG tablet Take 1 tablet by mouth daily. 90 tablet 1  . cyclobenzaprine (FLEXERIL) 5 MG tablet Take 1 tablet (5 mg total) by mouth 3 (three) times daily as needed for muscle spasms. 90 tablet 0  . flecainide (TAMBOCOR) 50 MG tablet Take 1 tablet (50 mg total) by mouth 2 (two) times daily. 60 tablet 1  . OMEGA-3 KRILL OIL PO Take by mouth daily.    . rivaroxaban (XARELTO) 20 MG TABS tablet TAKE 1 TABLET DAILY WITH SUPPER 90 tablet 1   No facility-administered medications prior to visit.  No Known Allergies  ROS Review of Systems  Constitutional: Negative.   Eyes: Negative.   Respiratory: Negative.   Cardiovascular: Negative.   Gastrointestinal: Negative.   Endocrine: Negative.   Genitourinary: Negative.   Musculoskeletal: Negative.   Skin: Negative.   Allergic/Immunologic: Negative.   Neurological: Negative.   Hematological: Negative.   Psychiatric/Behavioral: Negative.   All other systems reviewed and are negative.     Objective:    Physical Exam  Constitutional: She is oriented to person, place, and time. She appears well-developed and well-nourished. No distress.  Cardiovascular: Normal rate and regular rhythm.  Pulmonary/Chest: Effort normal. No respiratory distress.  Neurological: She is alert and oriented to person, place, and time.  Skin: Skin is dry. No rash noted. She is not  diaphoretic. No erythema. No pallor.  Psychiatric: She has a normal mood and affect. Her behavior is normal. Judgment and thought content normal.  Nursing note and vitals reviewed.   BP 139/83   Pulse (!) 110   Temp 98.2 F (36.8 C)   Ht 5\' 5"  (1.651 m)   Wt 187 lb (84.8 kg)   SpO2 100%   BMI 31.12 kg/m  Wt Readings from Last 3 Encounters:  05/16/19 187 lb (84.8 kg)  05/04/19 185 lb 12.8 oz (84.3 kg)  04/06/19 185 lb (83.9 kg)     There are no preventive care reminders to display for this patient.  There are no preventive care reminders to display for this patient.  Lab Results  Component Value Date   TSH 1.610 07/06/2018   Lab Results  Component Value Date   WBC 7.4 07/06/2018   HGB 12.8 07/06/2018   HCT 41.6 07/06/2018   MCV 81 07/06/2018   PLT 264 07/06/2018   Lab Results  Component Value Date   NA 139 04/29/2019   K 3.7 04/29/2019   CO2 26 04/29/2019   GLUCOSE 99 04/29/2019   BUN 8 04/29/2019   CREATININE 0.89 04/29/2019   BILITOT 0.4 07/06/2018   ALKPHOS 61 07/06/2018   AST 26 07/06/2018   ALT 21 07/06/2018   PROT 8.0 07/06/2018   ALBUMIN 4.7 07/06/2018   CALCIUM 9.5 04/29/2019   Lab Results  Component Value Date   CHOL 226 (H) 07/06/2018   Lab Results  Component Value Date   HDL 82 07/06/2018   Lab Results  Component Value Date   LDLCALC 127 (H) 07/06/2018   Lab Results  Component Value Date   TRIG 86 07/06/2018   Lab Results  Component Value Date   CHOLHDL 2.8 07/06/2018   Lab Results  Component Value Date   HGBA1C 5.7 01/26/2014      Assessment & Plan:   Problem List Items Addressed This Visit    None    Visit Diagnoses    Encounter to establish care    -  Primary      No orders of the defined types were placed in this encounter.   Follow-up: Return in about 3 months (around 08/16/2019).   PLAN  Discussed medical history and plan of care with patient - she ideally will have her three medications managed by  cardiology, with whom she has a follow up scheduled for Jul 04, 2019. We will hope to see her in around 3 months for follow up, CPE and labs if desired  TDaP and Flu vaccines given today.   Patient encouraged to call clinic with any questions, comments, or concerns.   Maximiano Coss, NP

## 2019-05-16 NOTE — Patient Instructions (Signed)
° ° ° °  If you have lab work done today you will be contacted with your lab results within the next 2 weeks.  If you have not heard from us then please contact us. The fastest way to get your results is to register for My Chart. ° ° °IF you received an x-ray today, you will receive an invoice from [facility]. Please contact [facility] at [phone] with questions or concerns regarding your invoice.  ° °IF you received labwork today, you will receive an invoice from [facility]. Please contact [facility] at [phone] with questions or concerns regarding your invoice.  ° °Our billing staff will not be able to assist you with questions regarding bills from these companies. ° °You will be contacted with the lab results as soon as they are available. The fastest way to get your results is to activate your My Chart account. Instructions are located on the last page of this paperwork. If you have not heard from us regarding the results in 2 weeks, please contact this office. °  ° ° ° °

## 2019-05-19 ENCOUNTER — Ambulatory Visit: Payer: Commercial Managed Care - PPO | Admitting: Cardiology

## 2019-06-06 ENCOUNTER — Other Ambulatory Visit: Payer: Self-pay

## 2019-06-06 MED ORDER — FLECAINIDE ACETATE 50 MG PO TABS: 50.0000 mg | ORAL_TABLET | Freq: Two times a day (BID) | ORAL | 1 refills | Status: DC

## 2019-06-06 MED ORDER — FLECAINIDE ACETATE 50 MG PO TABS: 50.0000 mg | ORAL_TABLET | Freq: Two times a day (BID) | ORAL | 2 refills | Status: DC

## 2019-06-08 ENCOUNTER — Other Ambulatory Visit: Payer: Self-pay | Admitting: Cardiology

## 2019-06-08 NOTE — Telephone Encounter (Signed)
We can send it, but she does need to be scheduled for GXT. I had sent a message and it does not appear that it has been done. Please let the front staff know that they need to call her and schedule in the next few weeks

## 2019-06-08 NOTE — Telephone Encounter (Signed)
Please fill

## 2019-06-08 NOTE — Telephone Encounter (Signed)
Can you handle this?

## 2019-06-09 ENCOUNTER — Other Ambulatory Visit: Payer: Self-pay

## 2019-06-09 MED ORDER — FLECAINIDE ACETATE 50 MG PO TABS: 50.0000 mg | ORAL_TABLET | Freq: Two times a day (BID) | ORAL | 3 refills | Status: DC

## 2019-06-09 NOTE — Telephone Encounter (Signed)
Did you schedule this?

## 2019-06-13 NOTE — Telephone Encounter (Signed)
Patient was called she cx her fu for 12/7 instead she changed it to a cardiac stress test. She wants her meds to be called in, she is confused about the phamracy bc [facility] told her they wont fill in rx for her, it had to be a mail in order. Patient wants a call from [place] to clarify as she needs rx right away

## 2019-06-13 NOTE — Telephone Encounter (Signed)
Called pt back, LVM awaiting call back

## 2019-06-27 ENCOUNTER — Other Ambulatory Visit: Payer: Self-pay | Admitting: Cardiology

## 2019-06-27 DIAGNOSIS — I480 Paroxysmal atrial fibrillation: Secondary | ICD-10-CM

## 2019-06-27 NOTE — Progress Notes (Signed)
Will cancel GXT due to changes within our office due to increased COVID cases. Will have patient do flecainide level

## 2019-07-04 ENCOUNTER — Ambulatory Visit (INDEPENDENT_AMBULATORY_CARE_PROVIDER_SITE_OTHER): Payer: Commercial Managed Care - PPO | Admitting: Cardiology

## 2019-07-04 ENCOUNTER — Ambulatory Visit: Payer: Commercial Managed Care - PPO | Admitting: Cardiology

## 2019-07-04 ENCOUNTER — Other Ambulatory Visit: Payer: Self-pay

## 2019-07-04 ENCOUNTER — Encounter: Payer: Self-pay | Admitting: Cardiology

## 2019-07-04 VITALS — BP 129/89 | HR 70 | Temp 96.4°F | Ht 65.0 in | Wt 179.0 lb

## 2019-07-04 DIAGNOSIS — I480 Paroxysmal atrial fibrillation: Secondary | ICD-10-CM | POA: Diagnosis not present

## 2019-07-04 DIAGNOSIS — I10 Essential (primary) hypertension: Secondary | ICD-10-CM

## 2019-07-04 NOTE — Progress Notes (Signed)
Primary Physician:  Janeece Agee, NP   Patient ID: Mandy Price, female    DOB: 24-Nov-1955, 63 y.o.   MRN: [id number]  Subjective:    Chief Complaint  Patient presents with  . PAF  . Follow-up    2 month    HPI: Mandy Price  is a 63 y.o. female  with hypertension, recently seen for follow up on atrial fibrillation on PCP EKG.   She is essentially asymptomatic in regards to A. fib.  She underwent Lexiscan nuclear stress testing in July 2020 that was considered low risk study.  Echocardiogram was also performed in August 2020 that revealed normal LVEF.  She continued to be in A. fib, and given her young age, I had recommended starting antiarrhythmic therapy and proceeding with cardioversion to try to obtain sinus rhythm.  Patient wanted to first try flecainide. At her last visit, she was found to be maintaining sinus rhythm with low dose flecainide; therefore, cardioversion was cancelled.  She now presents for 3 month follow up.   She is doing well without any complaints. Heart rate has been stable. Denies any fatigue, chest pain, shortness of breath, or palpitations. Does have some dyspnea if she over exerts herself. She denies any hyperlipidemia, diabetes, or history of sleep apnea.   She does not exercise regularly, but states that she is very active with daily activities. No difficulty with these activities. Denies any tobacco use.  Past Medical History:  Diagnosis Date  . A-fib (HCC)   . Hypertension     Past Surgical History:  Procedure Laterality Date  . None Surgeries      Social History   Socioeconomic History  . Marital status: Married    Spouse name: Not on file  . Number of children: 4  . Years of education: Not on file  . Highest education level: Not on file  Occupational History  . Not on file  Social Needs  . Financial resource strain: Not hard at all  . Food insecurity    Worry: Never true    Inability: Never true  . Transportation needs   Medical: No    Non-medical: No  Tobacco Use  . Smoking status: Never Smoker  . Smokeless tobacco: Never Used  Substance and Sexual Activity  . Alcohol use: No  . Drug use: No  . Sexual activity: Yes    Partners: Male  Lifestyle  . Physical activity    Days per week: 3 days    Minutes per session: 30 min  . Stress: Only a little  Relationships  . Social Musician on phone: Three times a week    Gets together: Twice a week    Attends religious service: Patient refused    Active member of club or organization: Patient refused    Attends meetings of clubs or organizations: Patient refused    Relationship status: Married  . Intimate partner violence    Fear of current or ex partner: No    Emotionally abused: No    Physically abused: No    Forced sexual activity: No  Other Topics Concern  . 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  Eyes: Negative for visual disturbance.  Cardiovascular: Positive for dyspnea on exertion. Negative for chest pain, claudication, leg swelling, orthopnea, palpitations and syncope.  Respiratory: Negative for hemoptysis and wheezing.   Endocrine: Negative for cold intolerance and heat intolerance.  Hematologic/Lymphatic: Does not bruise/bleed easily.  Skin: Negative for nail changes.  Musculoskeletal: Positive for back pain. Negative for muscle weakness and myalgias.  Gastrointestinal: Negative for abdominal pain, change in bowel habit, nausea and vomiting.  Neurological: Negative for difficulty with concentration, dizziness, focal weakness and headaches.  Psychiatric/Behavioral: Negative for altered mental status and suicidal ideas.  All other systems reviewed and are negative.     Objective:  Blood pressure 129/89, pulse 70, temperature (!) 96.4 F (35.8 C), height 5\' 5"   (1.651 m), weight 179 lb (81.2 kg), SpO2 99 %. Body mass index is 29.79 kg/m.    Physical Exam  Constitutional: She is oriented to person, place, and time. Vital signs are normal. She appears well-developed and well-nourished.  HENT:  Head: Normocephalic and atraumatic.  Neck: Normal range of motion.  Cardiovascular: Normal rate, regular rhythm, normal heart sounds and intact distal pulses.  Pulmonary/Chest: Effort normal and breath sounds normal. No accessory muscle usage. No respiratory distress.  Abdominal: Soft. Bowel sounds are normal.  Musculoskeletal: Normal range of motion.  Neurological: She is alert and oriented to person, place, and time.  Skin: Skin is warm and dry.  Vitals reviewed.  Radiology: No results found.  Laboratory examination:    CMP Latest Ref Rng & Units 04/29/2019 07/06/2018 08/08/2017  Glucose 65 - 99 mg/dL 99 75 83  BUN 8 - 27 mg/dL 8 14 16   Creatinine 0.57 - 1.00 mg/dL 0.89 0.85 0.80  Sodium 134 - 144 mmol/L 139 136 141  Potassium 3.5 - 5.2 mmol/L 3.7 3.6 4.0  Chloride 96 - 106 mmol/L 100 100 100  CO2 20 - 29 mmol/L 26 22 21   Calcium 8.7 - 10.3 mg/dL 9.5 9.9 9.9  Total Protein 6.0 - 8.5 g/dL - 8.0 8.0  Total Bilirubin 0.0 - 1.2 mg/dL - 0.4 0.4  Alkaline Phos 39 - 117 IU/L - 61 62  AST 0 - 40 IU/L - 26 27  ALT 0 - 32 IU/L - 21 31   CBC Latest Ref Rng & Units 07/06/2018 11/27/2017 08/08/2017  WBC 3.4 - 10.8 x10E3/uL 7.4 5.0 6.4  Hemoglobin 11.1 - 15.9 g/dL 12.8 12.6 13.0  Hematocrit 34.0 - 46.6 % 41.6 38.6 41.5  Platelets 150 - 450 x10E3/uL 264 - 269   Lipid Panel     Component Value Date/Time   CHOL 226 (H) 07/06/2018 1615   TRIG 86 07/06/2018 1615   HDL 82 07/06/2018 1615   CHOLHDL 2.8 07/06/2018 1615   CHOLHDL 2.7 05/16/2016 1916   VLDL 30 05/16/2016 1916   LDLCALC 127 (H) 07/06/2018 1615   HEMOGLOBIN A1C Lab Results  Component Value Date   HGBA1C 5.7 01/26/2014   TSH Recent Labs    07/06/18 1615  TSH 1.610    PRN Meds:.  There are no discontinued medications. Current Meds  Medication Sig  . atenolol-chlorthalidone (TENORETIC) 100-25 MG tablet Take 1 tablet by mouth daily.  . cyclobenzaprine (FLEXERIL) 5 MG tablet Take 1 tablet (5 mg total) by mouth 3 (three) times daily as needed for muscle spasms.  . flecainide (TAMBOCOR) 50 MG tablet Take 1 tablet (50 mg total) by mouth 2 (two) times daily.  . OMEGA-3 KRILL OIL PO Take by mouth daily.  . rivaroxaban (XARELTO) 20 MG TABS tablet TAKE 1 TABLET DAILY WITH SUPPER    Cardiac Studies:   Echocardiogram 03/25/2019: Left ventricle cavity is normal  in size. Normal left ventricular wall thickness. Normal LV systolic function with EF 55%. Normal global wall motion. Unable to evaluate diastolic function due to atrial fibrillation. Calculated EF 55%. Left atrial cavity is mildly dilated. Mild (Grade I) mitral regurgitation. Mild to moderate tricuspid regurgitation. Estimated pulmonary artery systolic pressure is 26 mmHg. Compared to previous study in 2018, PA pressures have improved.   Lexiscan Sestamibi stress test 02/18/2019: Lexiscan stress test was performed. Stress EKG is non-diagnostic, as this is pharmacological stress test. Myocardial perfusion imaging is normal. LVEF 58%. Low risk study.  Assessment:     ICD-10-CM   1. Paroxysmal atrial fibrillation (HCC)  I48.0 Flecainide level  2. Essential hypertension  I10    EKG 05/04/2019: Normal sinus rhythm at 74 bpm, left atrial abnormality, normal axis, diffuse non-specific T wave abnormality, cannot exclude ischemia.   CHA2DS2-VASc Score is 2 with yearly risk of stroke of 2.2 %. HAS-Bled score is 0 and estimated major bleeding in one year is 0.6-1.13 %  Recommendations:   Patient is here on 3 month follow up for paroxysmal atrial fibrillation. She has not had any known recurrence of A fib since being on Flecainide. She is tolerating medications well. As we are currently not performing treadmill stress test  in the office due to the pandemic, will have her perform flecainide level. She is tolerating anticoagulation well, no bleeding diathesis.   Blood pressure is well controlled. Continue with current medications. She will be seeing her PCP in the next few weeks for annual physical. I will see her back in 1 year or sooner if problems.    Toniann FailAshton Haynes Kelley, MSN, APRN, FNP-C [facility] Office: [phone] Fax: [phone]

## 2019-07-09 LAB — FLECAINIDE LEVEL: Flecainide: 0.21 ug/mL (ref 0.20–1.00)

## 2019-07-25 ENCOUNTER — Other Ambulatory Visit: Payer: Self-pay | Admitting: Registered Nurse

## 2019-07-25 DIAGNOSIS — Z1231 Encounter for screening mammogram for malignant neoplasm of breast: Secondary | ICD-10-CM

## 2019-08-01 ENCOUNTER — Ambulatory Visit
Admission: RE | Admit: 2019-08-01 | Discharge: 2019-08-01 | Disposition: A | Discharge status: Home / Self Care | Payer: Commercial Managed Care - PPO | Source: Ambulatory Visit | Attending: Registered Nurse | Admitting: Registered Nurse

## 2019-08-01 ENCOUNTER — Other Ambulatory Visit: Payer: Self-pay

## 2019-08-01 DIAGNOSIS — Z1231 Encounter for screening mammogram for malignant neoplasm of breast: Secondary | ICD-10-CM

## 2019-08-24 ENCOUNTER — Other Ambulatory Visit: Payer: Self-pay | Admitting: Family Medicine

## 2019-08-24 DIAGNOSIS — I10 Essential (primary) hypertension: Secondary | ICD-10-CM

## 2019-10-03 ENCOUNTER — Encounter: Payer: Commercial Managed Care - PPO | Admitting: Registered Nurse

## 2019-10-17 ENCOUNTER — Ambulatory Visit (INDEPENDENT_AMBULATORY_CARE_PROVIDER_SITE_OTHER): Payer: Commercial Managed Care - PPO | Admitting: Registered Nurse

## 2019-10-17 ENCOUNTER — Other Ambulatory Visit: Payer: Self-pay

## 2019-10-17 ENCOUNTER — Encounter: Payer: Self-pay | Admitting: Registered Nurse

## 2019-10-17 VITALS — BP 125/79 | HR 68 | Temp 97.4°F | Ht 65.0 in | Wt 177.0 lb

## 2019-10-17 DIAGNOSIS — Z1329 Encounter for screening for other suspected endocrine disorder: Secondary | ICD-10-CM | POA: Diagnosis not present

## 2019-10-17 DIAGNOSIS — I10 Essential (primary) hypertension: Secondary | ICD-10-CM

## 2019-10-17 DIAGNOSIS — Z130 Encounter for screening for diseases of the blood and blood-forming organs and certain disorders involving the immune mechanism: Secondary | ICD-10-CM

## 2019-10-17 DIAGNOSIS — I480 Paroxysmal atrial fibrillation: Secondary | ICD-10-CM

## 2019-10-17 DIAGNOSIS — Z0001 Encounter for general adult medical examination with abnormal findings: Secondary | ICD-10-CM

## 2019-10-17 DIAGNOSIS — Z13220 Encounter for screening for lipoid disorders: Secondary | ICD-10-CM | POA: Diagnosis not present

## 2019-10-17 DIAGNOSIS — Z13228 Encounter for screening for other metabolic disorders: Secondary | ICD-10-CM

## 2019-10-17 MED ORDER — RIVAROXABAN 20 MG PO TABS: ORAL_TABLET | ORAL | 2 refills | Status: DC

## 2019-10-17 MED ORDER — ATENOLOL-CHLORTHALIDONE 100-25 MG PO TABS: 1.0000 | ORAL_TABLET | Freq: Every day | ORAL | 3 refills | Status: DC

## 2019-10-17 MED ORDER — FLECAINIDE ACETATE 50 MG PO TABS: 50.0000 mg | ORAL_TABLET | Freq: Two times a day (BID) | ORAL | 2 refills | Status: DC

## 2019-10-17 NOTE — Patient Instructions (Signed)
° ° ° °  If you have lab work done today you will be contacted with your lab results within the next 2 weeks.  If you have not heard from us then please contact us. The fastest way to get your results is to register for My Chart. ° ° °IF you received an x-ray today, you will receive an invoice from [facility]. Please contact [facility] at [phone] with questions or concerns regarding your invoice.  ° °IF you received labwork today, you will receive an invoice from [facility]. Please contact [facility] at [phone] with questions or concerns regarding your invoice.  ° °Our billing staff will not be able to assist you with questions regarding bills from these companies. ° °You will be contacted with the lab results as soon as they are available. The fastest way to get your results is to activate your My Chart account. Instructions are located on the last page of this paperwork. If you have not heard from us regarding the results in 2 weeks, please contact this office. °  ° ° ° °

## 2019-10-17 NOTE — Progress Notes (Signed)
Established Patient Office Visit  Subjective:  Patient ID: Mandy Price, female    DOB: 04-02-1956  Age: 64 y.o. MRN: [id number]  CC:  Chief Complaint  Patient presents with  . Annual Exam    Physical  . Medication Refill    on all medications    HPI Mandy Price presents for CPe and labs  Feels well overall - no major concerns or complaints.  Back at work since early Dec. Feeling good. Felt some muscle strain after going back to full duty, used OTC topicals with good effect  Last seen by cardiology in dec. Afib managed with tambocor 50mg  PO bid and Xarelto 20mg  PO qd. No irregular heartbeat, chest pain, shob, doe, headache, visual changes, weakness, easy bruising or bleeding.  Past Medical History:  Diagnosis Date  . A-fib (HCC)   . Hypertension     Past Surgical History:  Procedure Laterality Date  . None Surgeries      No family history on file.  Social History   Socioeconomic History  . Marital status: Married    Spouse name: Not on file  . Number of children: 4  . Years of education: Not on file  . Highest education level: Not on file  Occupational History  . Not on file  Tobacco Use  . Smoking status: Never Smoker  . Smokeless tobacco: Never Used  Substance and Sexual Activity  . Alcohol use: No  . Drug use: No  . Sexual activity: Yes    Partners: Male  Other Topics Concern  . Not on file  Social History Narrative   Married   4 grown children     Social Determinants of Health   Financial Resource Strain: Low Risk   . Difficulty of Paying Living Expenses: Not hard at all  Food Insecurity: No Food Insecurity  . Worried About in the Last Year: Never true  . Ran Out of Food in the Last Year: Never true  Transportation Needs: No Transportation Needs  . Lack of Transportation (Medical): No  . Lack of Transportation (Non-Medical): No  Physical Activity: Insufficiently Active  . Days of Exercise per  Week: 3 days  . Minutes of Exercise per Session: 30 min  Stress: No Stress Concern Present  . Feeling of Stress : Only a little  Social Connections: Unknown  . Frequency of Communication with Friends and Family: Three times a week  . Frequency of Social Gatherings with Friends and Family: Twice a week  . Attends Religious Services: Patient refused  . Active Member of Clubs or Organizations: Patient refused  . Attends Arboriculturist Meetings: Patient refused  . Marital Status: Married  Programme researcher, broadcasting/film/video Violence: Not At Risk  . Fear of Current or Ex-Partner: No  . Emotionally Abused: No  . Physically Abused: No  . Sexually Abused: No    Outpatient Medications Prior to Visit  Medication Sig Dispense Refill  . cyclobenzaprine (FLEXERIL) 5 MG tablet Take 1 tablet (5 mg total) by mouth 3 (three) times daily as needed for muscle spasms. 90 tablet 0  . OMEGA-3 KRILL OIL PO Take by mouth daily.    Banker atenolol-chlorthalidone (TENORETIC) 100-25 MG tablet TAKE 1 TABLET DAILY 90 tablet 3  . flecainide (TAMBOCOR) 50 MG tablet Take 1 tablet (50 mg total) by mouth 2 (two) times daily. 180 tablet 3  . rivaroxaban (XARELTO) 20 MG TABS tablet TAKE 1 TABLET DAILY WITH SUPPER 90 tablet 1  No facility-administered medications prior to visit.    No Known Allergies  ROS Review of Systems  Constitutional: Negative.   HENT: Negative.   Eyes: Negative.   Respiratory: Negative.   Cardiovascular: Negative.   Gastrointestinal: Negative.   Endocrine: Negative.   Genitourinary: Negative.   Musculoskeletal: Negative.   Skin: Negative.   Allergic/Immunologic: Negative.   Neurological: Negative.   Hematological: Negative.   Psychiatric/Behavioral: Negative.   All other systems reviewed and are negative.     Objective:    Physical Exam  Constitutional: She is oriented to person, place, and time. She appears well-developed and well-nourished. No distress.  HENT:  Head: Normocephalic and  atraumatic.  Right Ear: External ear normal.  Left Ear: External ear normal.  Nose: Nose normal.  Mouth/Throat: Oropharynx is clear and moist. No oropharyngeal exudate.  Eyes: Pupils are equal, round, and reactive to light. Conjunctivae and EOM are normal. Right eye exhibits no discharge. Left eye exhibits no discharge. No scleral icterus.  Neck: No JVD present. No tracheal deviation present. No thyromegaly present.  Cardiovascular: Normal rate, normal heart sounds and intact distal pulses. Exam reveals no gallop and no friction rub.  No murmur heard. Pulmonary/Chest: Effort normal and breath sounds normal. No respiratory distress. She has no wheezes. She has no rales. She exhibits no tenderness.  Abdominal: Soft. Bowel sounds are normal. She exhibits no distension and no mass. There is no abdominal tenderness. There is no rebound and no guarding.  Musculoskeletal:        General: No tenderness, deformity or edema. Normal range of motion.     Cervical back: Normal range of motion and neck supple.  Lymphadenopathy:    She has no cervical adenopathy.  Neurological: She is alert and oriented to person, place, and time. No cranial nerve deficit. She exhibits normal muscle tone. Coordination normal.  Skin: Skin is warm and dry. No rash noted. She is not diaphoretic. No erythema. No pallor.  Psychiatric: She has a normal mood and affect. Her behavior is normal. Judgment and thought content normal.  Nursing note and vitals reviewed.   BP 125/79   Pulse 68   Temp (!) 97.4 F (36.3 C) (Temporal)   Ht 5\' 5"  (1.651 m)   Wt 177 lb (80.3 kg)   SpO2 99%   BMI 29.45 kg/m  Wt Readings from Last 3 Encounters:  10/17/19 177 lb (80.3 kg)  07/04/19 179 lb (81.2 kg)  05/16/19 187 lb (84.8 kg)     There are no preventive care reminders to display for this patient.  There are no preventive care reminders to display for this patient.  Lab Results  Component Value Date   TSH 1.610 07/06/2018    Lab Results  Component Value Date   WBC 7.4 07/06/2018   HGB 12.8 07/06/2018   HCT 41.6 07/06/2018   MCV 81 07/06/2018   PLT 264 07/06/2018   Lab Results  Component Value Date   NA 139 04/29/2019   K 3.7 04/29/2019   CO2 26 04/29/2019   GLUCOSE 99 04/29/2019   BUN 8 04/29/2019   CREATININE 0.89 04/29/2019   BILITOT 0.4 07/06/2018   ALKPHOS 61 07/06/2018   AST 26 07/06/2018   ALT 21 07/06/2018   PROT 8.0 07/06/2018   ALBUMIN 4.7 07/06/2018   CALCIUM 9.5 04/29/2019   Lab Results  Component Value Date   CHOL 226 (H) 07/06/2018   Lab Results  Component Value Date   HDL 82 07/06/2018  Lab Results  Component Value Date   LDLCALC 127 (H) 07/06/2018   Lab Results  Component Value Date   TRIG 86 07/06/2018   Lab Results  Component Value Date   CHOLHDL 2.8 07/06/2018   Lab Results  Component Value Date   HGBA1C 5.7 01/26/2014      Assessment & Plan:   Problem List Items Addressed This Visit      Cardiovascular and Mediastinum   HTN (hypertension) (Chronic)   Relevant Medications   atenolol-chlorthalidone (TENORETIC) 100-25 MG tablet   flecainide (TAMBOCOR) 50 MG tablet   rivaroxaban (XARELTO) 20 MG TABS tablet   Atrial fibrillation (HCC)   Relevant Medications   atenolol-chlorthalidone (TENORETIC) 100-25 MG tablet   flecainide (TAMBOCOR) 50 MG tablet   rivaroxaban (XARELTO) 20 MG TABS tablet    Other Visit Diagnoses    Screening for endocrine, metabolic and immunity disorder    -  Primary   Relevant Orders   TSH   Comprehensive metabolic panel   CBC with Differential   Hemoglobin A1c   Lipid screening       Relevant Orders   Lipid panel      Meds ordered this encounter  Medications  . atenolol-chlorthalidone (TENORETIC) 100-25 MG tablet    Sig: Take 1 tablet by mouth daily.    Dispense:  90 tablet    Refill:  3    Order Specific Question:   Supervising Provider    Answer:   Collie Siad A [id number]  . flecainide (TAMBOCOR) 50 MG  tablet    Sig: Take 1 tablet (50 mg total) by mouth 2 (two) times daily.    Dispense:  180 tablet    Refill:  2    Order Specific Question:   Supervising Provider    Answer:   Collie Siad A [id number]  . rivaroxaban (XARELTO) 20 MG TABS tablet    Sig: TAKE 1 TABLET DAILY WITH SUPPER    Dispense:  90 tablet    Refill:  2    Order Specific Question:   Supervising Provider    Answer:   Doristine Bosworth [id number]    Follow-up: No follow-ups on file.   PLAN  Reassuring exam  Labs collected, will follow up as warranted  Meds refilled.  Follow up with cardiology scheduled for Dec 2021  Follow up in 1 year for CPE and labs  Patient encouraged to call clinic with any questions, comments, or concerns.  Janeece Agee, NP

## 2019-10-18 ENCOUNTER — Encounter: Payer: Self-pay | Admitting: Registered Nurse

## 2019-10-18 LAB — COMPREHENSIVE METABOLIC PANEL
ALT: 30 IU/L (ref 0–32)
AST: 28 IU/L (ref 0–40)
Albumin/Globulin Ratio: 1.4 (ref 1.2–2.2)
Albumin: 4.6 g/dL (ref 3.8–4.8)
Alkaline Phosphatase: 71 IU/L (ref 39–117)
BUN/Creatinine Ratio: 14 (ref 12–28)
BUN: 13 mg/dL (ref 8–27)
Bilirubin Total: 0.4 mg/dL (ref 0.0–1.2)
CO2: 23 mmol/L (ref 20–29)
Calcium: 10 mg/dL (ref 8.7–10.3)
Chloride: 103 mmol/L (ref 96–106)
Creatinine, Ser: 0.93 mg/dL (ref 0.57–1.00)
GFR calc Af Amer: 76 mL/min/{1.73_m2} (ref >59)
GFR calc non Af Amer: 66 mL/min/{1.73_m2} (ref >59)
Globulin, Total: 3.2 g/dL (ref 1.5–4.5)
Glucose: 89 mg/dL (ref 65–99)
Potassium: 4.2 mmol/L (ref 3.5–5.2)
Sodium: 144 mmol/L (ref 134–144)
Total Protein: 7.8 g/dL (ref 6.0–8.5)

## 2019-10-18 LAB — LIPID PANEL
Chol/HDL Ratio: 2.8 ratio (ref 0.0–4.4)
Cholesterol, Total: 201 mg/dL — ABNORMAL HIGH (ref 100–199)
HDL: 72 mg/dL (ref >39)
LDL Chol Calc (NIH): 112 mg/dL — ABNORMAL HIGH (ref 0–99)
Triglycerides: 94 mg/dL (ref 0–149)
VLDL Cholesterol Cal: 17 mg/dL (ref 5–40)

## 2019-10-18 LAB — CBC WITH DIFFERENTIAL/PLATELET
Basophils Absolute: 0.1 10*3/uL (ref 0.0–0.2)
Basos: 1 %
EOS (ABSOLUTE): 0.1 10*3/uL (ref 0.0–0.4)
Eos: 2 %
Hematocrit: 41.9 % (ref 34.0–46.6)
Hemoglobin: 13.3 g/dL (ref 11.1–15.9)
Immature Grans (Abs): 0 10*3/uL (ref 0.0–0.1)
Immature Granulocytes: 0 %
Lymphocytes Absolute: 2.5 10*3/uL (ref 0.7–3.1)
Lymphs: 46 %
MCH: 25.4 pg — ABNORMAL LOW (ref 26.6–33.0)
MCHC: 31.7 g/dL (ref 31.5–35.7)
MCV: 80 fL (ref 79–97)
Monocytes Absolute: 0.5 10*3/uL (ref 0.1–0.9)
Monocytes: 9 %
Neutrophils Absolute: 2.3 10*3/uL (ref 1.4–7.0)
Neutrophils: 42 %
Platelets: 254 10*3/uL (ref 150–450)
RBC: 5.23 x10E6/uL (ref 3.77–5.28)
RDW: 13.2 % (ref 11.7–15.4)
WBC: 5.6 10*3/uL (ref 3.4–10.8)

## 2019-10-18 LAB — HEMOGLOBIN A1C
Est. average glucose Bld gHb Est-mCnc: 111 mg/dL
Hgb A1c MFr Bld: 5.5 % (ref 4.8–5.6)

## 2019-10-18 LAB — TSH: TSH: 1.69 u[IU]/mL (ref 0.450–4.500)

## 2020-06-04 ENCOUNTER — Other Ambulatory Visit: Payer: Self-pay

## 2020-06-04 ENCOUNTER — Ambulatory Visit (INDEPENDENT_AMBULATORY_CARE_PROVIDER_SITE_OTHER): Payer: Commercial Managed Care - PPO | Admitting: Registered Nurse

## 2020-06-04 DIAGNOSIS — I10 Essential (primary) hypertension: Secondary | ICD-10-CM

## 2020-06-04 DIAGNOSIS — Z23 Encounter for immunization: Secondary | ICD-10-CM | POA: Diagnosis not present

## 2020-06-04 DIAGNOSIS — I480 Paroxysmal atrial fibrillation: Secondary | ICD-10-CM

## 2020-06-04 MED ORDER — ATENOLOL-CHLORTHALIDONE 100-25 MG PO TABS: 1.0000 | ORAL_TABLET | Freq: Every day | ORAL | 3 refills | Status: DC

## 2020-06-04 MED ORDER — RIVAROXABAN 20 MG PO TABS: ORAL_TABLET | ORAL | 2 refills | Status: DC

## 2020-06-04 MED ORDER — FLECAINIDE ACETATE 50 MG PO TABS: 50.0000 mg | ORAL_TABLET | Freq: Two times a day (BID) | ORAL | 2 refills | Status: DC

## 2020-06-11 ENCOUNTER — Encounter: Payer: Self-pay | Admitting: *Deleted

## 2020-06-12 ENCOUNTER — Ambulatory Visit: Payer: Commercial Managed Care - PPO | Admitting: Registered Nurse

## 2020-06-18 ENCOUNTER — Encounter: Payer: Self-pay | Admitting: Registered Nurse

## 2020-06-18 ENCOUNTER — Ambulatory Visit: Payer: Commercial Managed Care - PPO | Admitting: Registered Nurse

## 2020-06-18 ENCOUNTER — Other Ambulatory Visit: Payer: Self-pay

## 2020-06-18 VITALS — BP 133/83 | HR 73 | Temp 98.0°F | Resp 18 | Ht 65.0 in | Wt 183.4 lb

## 2020-06-18 DIAGNOSIS — M545 Low back pain, unspecified: Secondary | ICD-10-CM | POA: Diagnosis not present

## 2020-06-18 DIAGNOSIS — Z0000 Encounter for general adult medical examination without abnormal findings: Secondary | ICD-10-CM

## 2020-06-18 NOTE — Progress Notes (Signed)
Established Patient Office Visit  Subjective:  Patient ID: Mandy Price, female    DOB: April 18, 1956  Age: 64 y.o. MRN: [id number]  CC:  Chief Complaint  Patient presents with   Medication Management    Patient states she is here for medication review. Patient states she is having some upper back pain she is currenly taking flexeril for her back.    HPI Karn Vandall presents for med refill and labs  Feeling well overall Has trouble with xarelto - ins won't cover claiming " insufficient information provided" she has an appeals form. We can contact [facility].   Otherwise notes some ongoing upper back pain. Still works long days in the kitchen, bent over. Does not want to go to ortho or PT. Does take flexeril every now and then with relief. No new symptoms or concerns.  Past Medical History:  Diagnosis Date   A-fib [facility])    Hypertension     Past Surgical History:  Procedure Laterality Date   None Surgeries      No family history on file.  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[employer]   Social Determinants of Health   Financial Resource Strain:    Difficulty of Paying Living Expenses: Not on file  Food Insecurity:    Worried About Programme researcher, broadcasting/film/video in the Last Year: Not on file   [employer] of Food in the Last Year: Not on file  Transportation Needs:    Lack of Transportation (Medical): Not on file   Lack of Transportation  (Non-Medical): Not on file  Physical Activity:    Days of Exercise per Week: Not on file   Minutes of Exercise per Session: Not on file  Stress:    Feeling of Stress : Not on file  Social Connections:    Frequency of Communication with Friends and Family: Not on file   Frequency of Social Gatherings with Friends and Family: Not on file   Attends Religious Services: Not on file   Active Member of Clubs or Organizations: Not on file   Attends Banker Meetings: Not on file   Marital Status: Not on file  Intimate Partner Violence:    Fear of Current or Ex-Partner: Not on file   Emotionally Abused: Not on file   Physically Abused: Not on file   Sexually Abused: Not on file    Outpatient Medications Prior to Visit  Medication Sig Dispense Refill   atenolol-chlorthalidone (TENORETIC) 100-25 MG tablet Take 1 tablet by mouth daily. 90 tablet 3   cyclobenzaprine (FLEXERIL) 5 MG tablet Take 1 tablet (5 mg total) by mouth 3 (three) times daily as needed for muscle spasms. 90 tablet 0   flecainide (TAMBOCOR) 50 MG tablet Take 1 tablet (50 mg total) by mouth 2 (two) times daily. 180 tablet  2   OMEGA-3 KRILL OIL PO Take by mouth daily.     rivaroxaban (XARELTO) 20 MG TABS tablet TAKE 1 TABLET DAILY WITH SUPPER 90 tablet 2   No facility-administered medications prior to visit.    No Known Allergies  ROS Review of Systems Per hpi     Objective:    Physical Exam Vitals and nursing note reviewed.  Constitutional:      General: She is not in acute distress.    Appearance: Normal appearance. She is normal weight. She is not ill-appearing, toxic-appearing or diaphoretic.  Cardiovascular:     Rate and Rhythm: Normal rate and regular rhythm.     Heart sounds: Normal heart sounds. No murmur heard.  No friction rub. No gallop.   Pulmonary:     Effort: Pulmonary effort is normal. No respiratory distress.     Breath sounds: Normal breath sounds. No stridor. No  wheezing, rhonchi or rales.  Chest:     Chest wall: No tenderness.  Skin:    General: Skin is warm and dry.  Neurological:     General: No focal deficit present.     Mental Status: She is alert and oriented to person, place, and time. Mental status is at baseline.  Psychiatric:        Mood and Affect: Mood normal.        Behavior: Behavior normal.        Thought Content: Thought content normal.        Judgment: Judgment normal.     BP 133/83    Pulse 73    Temp 98 F (36.7 C) (Temporal)    Resp 18    Ht 5\' 5"  (1.651 m)    Wt 183 lb 6.4 oz (83.2 kg)    SpO2 100%    BMI 30.52 kg/m  Wt Readings from Last 3 Encounters:  06/18/20 183 lb 6.4 oz (83.2 kg)  10/17/19 177 lb (80.3 kg)  07/04/19 179 lb (81.2 kg)     There are no preventive care reminders to display for this patient.  There are no preventive care reminders to display for this patient.  Lab Results  Component Value Date   TSH 1.690 10/17/2019   Lab Results  Component Value Date   WBC 5.6 10/17/2019   HGB 13.3 10/17/2019   HCT 41.9 10/17/2019   MCV 80 10/17/2019   PLT 254 10/17/2019   Lab Results  Component Value Date   NA 144 10/17/2019   K 4.2 10/17/2019   CO2 23 10/17/2019   GLUCOSE 89 10/17/2019   BUN 13 10/17/2019   CREATININE 0.93 10/17/2019   BILITOT 0.4 10/17/2019   ALKPHOS 71 10/17/2019   AST 28 10/17/2019   ALT 30 10/17/2019   PROT 7.8 10/17/2019   ALBUMIN 4.6 10/17/2019   CALCIUM 10.0 10/17/2019   Lab Results  Component Value Date   CHOL 201 (H) 10/17/2019   Lab Results  Component Value Date   HDL 72 10/17/2019   Lab Results  Component Value Date   LDLCALC 112 (H) 10/17/2019   Lab Results  Component Value Date   TRIG 94 10/17/2019   Lab Results  Component Value Date   CHOLHDL 2.8 10/17/2019   Lab Results  Component Value Date   HGBA1C 5.5 10/17/2019      Assessment & Plan:   Problem List Items Addressed This Visit    None    Visit Diagnoses    Routine general  medical examination at  a health care facility    -  Primary   Relevant Orders   Lipid panel   TSH   Comprehensive metabolic panel   CBC with Differential   Hemoglobin A1c      No orders of the defined types were placed in this encounter.   Follow-up: No follow-ups on file.   PLAN  Labs collected, will follow up as warranted  Refill meds x 6 mo  Return at that time for med check  Will follow up on xarelto  Discussed stretching for upper back.  Patient encouraged to call clinic with any questions, comments, or concerns.  Janeece Agee, NP

## 2020-06-18 NOTE — Patient Instructions (Signed)
° ° ° °  If you have lab work done today you will be contacted with your lab results within the next 2 weeks.  If you have not heard from us then please contact us. The fastest way to get your results is to register for My Chart. ° ° °IF you received an x-ray today, you will receive an invoice from [facility]. Please contact  Radiology at [phone] with questions or concerns regarding your invoice.  ° °IF you received labwork today, you will receive an invoice from [facility]. Please contact [facility] at [phone] with questions or concerns regarding your invoice.  ° °Our billing staff will not be able to assist you with questions regarding bills from these companies. ° °You will be contacted with the lab results as soon as they are available. The fastest way to get your results is to activate your My Chart account. Instructions are located on the last page of this paperwork. If you have not heard from us regarding the results in 2 weeks, please contact this office. °  ° ° ° °

## 2020-06-19 LAB — CBC WITH DIFFERENTIAL/PLATELET
Basophils Absolute: 0.1 10*3/uL (ref 0.0–0.2)
Basos: 1 %
EOS (ABSOLUTE): 0.1 10*3/uL (ref 0.0–0.4)
Eos: 2 %
Hematocrit: 37.2 % (ref 34.0–46.6)
Hemoglobin: 11.7 g/dL (ref 11.1–15.9)
Immature Grans (Abs): 0 10*3/uL (ref 0.0–0.1)
Immature Granulocytes: 0 %
Lymphocytes Absolute: 2.8 10*3/uL (ref 0.7–3.1)
Lymphs: 39 %
MCH: 24.5 pg — ABNORMAL LOW (ref 26.6–33.0)
MCHC: 31.5 g/dL (ref 31.5–35.7)
MCV: 78 fL — ABNORMAL LOW (ref 79–97)
Monocytes Absolute: 0.7 10*3/uL (ref 0.1–0.9)
Monocytes: 10 %
Neutrophils Absolute: 3.6 10*3/uL (ref 1.4–7.0)
Neutrophils: 48 %
Platelets: 206 10*3/uL (ref 150–450)
RBC: 4.78 x10E6/uL (ref 3.77–5.28)
RDW: 13 % (ref 11.7–15.4)
WBC: 7.3 10*3/uL (ref 3.4–10.8)

## 2020-06-19 LAB — TSH: TSH: 1.61 u[IU]/mL (ref 0.450–4.500)

## 2020-06-19 LAB — COMPREHENSIVE METABOLIC PANEL
ALT: 33 IU/L — ABNORMAL HIGH (ref 0–32)
AST: 38 IU/L (ref 0–40)
Albumin/Globulin Ratio: 1.7 (ref 1.2–2.2)
Albumin: 4.5 g/dL (ref 3.8–4.8)
Alkaline Phosphatase: 67 IU/L (ref 44–121)
BUN/Creatinine Ratio: 24 (ref 12–28)
BUN: 21 mg/dL (ref 8–27)
Bilirubin Total: 0.2 mg/dL (ref 0.0–1.2)
CO2: 26 mmol/L (ref 20–29)
Calcium: 9.5 mg/dL (ref 8.7–10.3)
Chloride: 101 mmol/L (ref 96–106)
Creatinine, Ser: 0.86 mg/dL (ref 0.57–1.00)
GFR calc Af Amer: 83 mL/min/{1.73_m2} (ref >59)
GFR calc non Af Amer: 72 mL/min/{1.73_m2} (ref >59)
Globulin, Total: 2.6 g/dL (ref 1.5–4.5)
Glucose: 94 mg/dL (ref 65–99)
Potassium: 3.4 mmol/L — ABNORMAL LOW (ref 3.5–5.2)
Sodium: 140 mmol/L (ref 134–144)
Total Protein: 7.1 g/dL (ref 6.0–8.5)

## 2020-06-19 LAB — LIPID PANEL
Chol/HDL Ratio: 2.8 ratio (ref 0.0–4.4)
Cholesterol, Total: 190 mg/dL (ref 100–199)
HDL: 69 mg/dL (ref >39)
LDL Chol Calc (NIH): 104 mg/dL — ABNORMAL HIGH (ref 0–99)
Triglycerides: 94 mg/dL (ref 0–149)
VLDL Cholesterol Cal: 17 mg/dL (ref 5–40)

## 2020-06-19 LAB — HEMOGLOBIN A1C
Est. average glucose Bld gHb Est-mCnc: 117 mg/dL
Hgb A1c MFr Bld: 5.7 % — ABNORMAL HIGH (ref 4.8–5.6)

## 2020-06-20 ENCOUNTER — Telehealth: Payer: Self-pay

## 2020-06-20 NOTE — Telephone Encounter (Signed)
Forms faxed back to [facility] for pt Rx

## 2020-06-25 ENCOUNTER — Telehealth: Payer: Self-pay | Admitting: *Deleted

## 2020-06-25 NOTE — Telephone Encounter (Signed)
xarelto approved [id number] thru 06/11/2021  Case number [id number]

## 2020-07-02 NOTE — Progress Notes (Signed)
Primary Physician/Referring:  Janeece Agee, NP  Patient ID: Mandy Price, female    DOB: October 19, 1955, 64 y.o.   MRN: [id number]  Chief Complaint  Patient presents with  . Atrial Fibrillation  . Follow-up    1 year   HPI:    Mandy Price  is a 64 y.o. female with hypertension, atrial fibrillation. Lexiscan nuclear stress testing in July 2020 that was considered low risk study.  Echocardiogram was also performed in August 2020 that revealed normal LVEF.  Patient preferred antiarrythmic therapy to cardioversion, therefore is presently on low dose flecainide.   Patient presents for annual follow up.  She remains asymptomatic with no known recurrence of atrial fibrillation. Denies fatigue, dizziness, palpitations, dyspnea, chest pain. 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 She states she is on her feet all day at work and very active without issue. She is tolerating Xarelto well without bleeding diathesis. She monitors her blood pressure on a daily basis at home and reports readings averaging 120s/60-70. Of note she does report she recently saw you PCP for an annual physical and her lipids and A1C were elevated.   Past Medical History:  Diagnosis Date  . A-fib (HCC)   . Hypertension    Past Surgical History:  Procedure Laterality Date  . None Surgeries     History reviewed. No pertinent family history.  Social History   Tobacco Use  . Smoking status: Never Smoker  . Smokeless tobacco: Never Used  Substance Use Topics  . Alcohol use: No   Marital Status: Married   ROS  Review of Systems  Constitutional: Negative for malaise/fatigue and weight gain.  Cardiovascular: Negative for chest pain, claudication, dyspnea on exertion, leg swelling, near-syncope, orthopnea, palpitations, paroxysmal nocturnal dyspnea and syncope.  Respiratory: Negative for shortness of breath.   Hematologic/Lymphatic: Does not  bruise/bleed easily.  Musculoskeletal: Positive for back pain.  Gastrointestinal: Negative for melena.  Neurological: Negative for dizziness and weakness.    Objective  Blood pressure 124/72, pulse 70, resp. rate 16, height 5\' 5"  (1.651 m), weight 180 lb (81.6 kg), SpO2 99 %.  Vitals with BMI 07/03/2020 06/18/2020 10/17/2019  Height 5\' 5"  5\' 5"  5\' 5"   Weight 180 lbs 183 lbs 6 oz 177 lbs  BMI 29.95 30.52 29.45  Systolic 124 133 10/19/2019  Diastolic 72 83 79  Pulse 70 73 68      Physical Exam Vitals reviewed.  HENT:     Head: Normocephalic and atraumatic.  Cardiovascular:     Rate and Rhythm: Normal rate and regular rhythm.     Pulses: Intact distal pulses.     Heart sounds: S1 normal and S2 normal. No murmur heard.  No gallop.   Pulmonary:     Effort: Pulmonary effort is normal. No respiratory distress.     Breath sounds: No wheezing, rhonchi or rales.  Musculoskeletal:     Right lower leg: No edema.     Left lower leg: No edema.  Neurological:     Mental Status: She is alert.     Laboratory examination:   Recent Labs    10/17/19 0813 06/18/20 1712  NA 144 140  K 4.2 3.4*  CL 103 101  CO2 23 26  GLUCOSE 89 94  BUN 13 21  CREATININE 0.93 0.86  CALCIUM 10.0 9.5  GFRNONAA 66 72  GFRAA 76 83   estimated creatinine clearance is 70.6 mL/min (by C-G  formula based on SCr of 0.86 mg/dL).  CMP Latest Ref Rng & Units 06/18/2020 10/17/2019 04/29/2019  Glucose 65 - 99 mg/dL 94 89 99  BUN 8 - 27 mg/dL 21 13 8   Creatinine 0.57 - 1.00 mg/dL 4.08 1.44  Sodium 134 - 144 mmol/L 140 144 139  Potassium 3.5 - 5.2 mmol/L 3.4(L) 4.2 3.7  Chloride 96 - 106 mmol/L 101 103 100  CO2 20 - 29 mmol/L 26 23 26   Calcium 8.7 - 10.3 mg/dL 9.5 8.18 9.5  Total Protein 6.0 - 8.5 g/dL 7.1 7.8 -  Total Bilirubin 0.0 - 1.2 mg/dL 0.2 0.4 -  Alkaline Phos 44 - 121 IU/L 67 71 -  AST 0 - 40 IU/L 38 28 -  ALT 0 - 32 IU/L 33(H) 30 -   CBC Latest Ref Rng & Units 06/18/2020 10/17/2019 07/06/2018  WBC  3.4 - 10.8 x10E3/uL 7.3 5.6 7.4  Hemoglobin 11.1 - 15.9 g/dL 10/19/2019 14/04/2018 14.9  Hematocrit 34.0 - 46.6 % 37.2 41.9 41.6  Platelets 150 - 450 x10E3/uL 206 254 264    Lipid Panel Recent Labs    10/17/19 0813 06/18/20 1712  CHOL 201* 190  TRIG 94 94  LDLCALC 112* 104*  HDL 72 69  CHOLHDL 2.8 2.8    HEMOGLOBIN 10/19/19 Lab Results  Component Value Date   HGBA1C 5.7 (H) 06/18/2020   TSH Recent Labs    10/17/19 0813 06/18/20 1712  TSH 1.690 1.610    External labs:  None   Medications and allergies  No Known Allergies   Outpatient Medications Prior to Visit  Medication Sig Dispense Refill  . atenolol-chlorthalidone (TENORETIC) 100-25 MG tablet Take 1 tablet by mouth daily. 90 tablet 3  . cyclobenzaprine (FLEXERIL) 5 MG tablet Take 1 tablet (5 mg total) by mouth 3 (three) times daily as needed for muscle spasms. 90 tablet 0  . flecainide (TAMBOCOR) 50 MG tablet Take 1 tablet (50 mg total) by mouth 2 (two) times daily. 180 tablet 2  . OMEGA-3 KRILL OIL PO Take by mouth daily.    . rivaroxaban (XARELTO) 20 MG TABS tablet TAKE 1 TABLET DAILY WITH SUPPER 90 tablet 2   No facility-administered medications prior to visit.     Radiology:   No results found.  Cardiac Studies:   Echocardiogram 03/25/2019: Left ventricle cavity is normal in size. Normal left ventricular wall thickness. Normal LV systolic function with EF 55%. Normal global wall motion. Unable to evaluate diastolic function due to atrial fibrillation. Calculated EF 55%. Left atrial cavity is mildly dilated. Mild (Grade I) mitral regurgitation. Mild to moderate tricuspid regurgitation. Estimated pulmonary artery systolic pressure is 26 mmHg. Compared to previous study in 2018, PA pressures have improved.   Lexiscan Sestamibi stress test 02/18/2019: Lexiscan stress test was performed. Stress EKG is non-diagnostic, as this is pharmacological stress test. Myocardial perfusion imaging is normal. LVEF 58%. Low risk  study.  EKG:   EKG 07/03/2020: Normal sinus rhythm at a rate of 69 bpm, left atrial enlargement.  Normal axis. Incomplete RBBB. Diffuse nonspecific T wave abnormality.  Compared to EKG 05/04/2019, no significant change.   Assessment     ICD-10-CM   1. Paroxysmal atrial fibrillation (HCC)  I48.0 EKG 12-Lead  2. Essential hypertension  I10   3. Hypercholesterolemia  E78.00      There are no discontinued medications.  No orders of the defined types were placed in this encounter.  CHA2DS2-VASc Score is 2 with yearly risk of stroke  of 2.2 %. HAS-Bled score is 0 and estimated major bleeding in one year is 0.6-1.13 %  Recommendations:   Siah Boltz is a 64 y.o. female with hypertension, atrial fibrillation. Lexiscan nuclear stress testing in July 2020 that was considered low risk study.  Echocardiogram was also performed in August 2020 that revealed normal LVEF.  Patient preferred antiarrythmic therapy to cardioversion, therefore is presently on low dose flecainide.   Patient presents for annual follow-up of paroxysmal atrial fibrillation.  She has had no known recurrence of A. fib since being on flecainide.  She remains asymptomatic and is tolerating medications well.  She had nuclear stress test in 01/2019 which was low risk.  She continues to tolerate Xarelto without bleeding diathesis.  We will continue low-dose flecainide and Xarelto.  In regard to hypertension patient's blood pressure was initially elevated in the office, upon recheck it is well controlled.  Patient also reports excellent blood pressure control with home readings.  Hypertension primarily managed by her PCP with atenolol/chlorthalidone, agree with continuing this.  I personally reviewed external labs which revealed LDL of 104 and A1c of 5.7%.  Discussed with patient regarding increased cardiovascular risks associated with hyperlipidemia and prediabetes.  Discussed at length regarding diet and lifestyle modifications and  weight loss to reduce risk.  Educated patient about medication options to lower LDL and A1c, patient presently prefers to make lifestyle changes prior to initiating medical therapy. She is otherwise stable from a cardiovascular standpoint.  Follow-up in 1 year for paroxysmal atrial fibrillation, hypertension, hyperlipidemia.  During this visit I reviewed and updated: Tobacco history  allergies medication reconciliation  medical history  surgical history  family history  social history.    Rayford Halsted, PA-C 07/03/2020, 9:52 AM Office: [phone]

## 2020-07-03 ENCOUNTER — Encounter: Payer: Self-pay | Admitting: Student

## 2020-07-03 ENCOUNTER — Ambulatory Visit: Payer: Commercial Managed Care - PPO | Admitting: Student

## 2020-07-03 ENCOUNTER — Other Ambulatory Visit: Payer: Self-pay

## 2020-07-03 ENCOUNTER — Other Ambulatory Visit: Payer: Self-pay | Admitting: Emergency Medicine

## 2020-07-03 ENCOUNTER — Ambulatory Visit: Payer: Commercial Managed Care - PPO | Admitting: Cardiology

## 2020-07-03 VITALS — BP 124/72 | HR 70 | Resp 16 | Ht 65.0 in | Wt 180.0 lb

## 2020-07-03 DIAGNOSIS — E7800 Pure hypercholesterolemia, unspecified: Secondary | ICD-10-CM

## 2020-07-03 DIAGNOSIS — I10 Essential (primary) hypertension: Secondary | ICD-10-CM

## 2020-07-03 DIAGNOSIS — I480 Paroxysmal atrial fibrillation: Secondary | ICD-10-CM

## 2020-07-03 MED ORDER — RIVAROXABAN 20 MG PO TABS: ORAL_TABLET | ORAL | 2 refills | Status: DC

## 2020-07-31 ENCOUNTER — Other Ambulatory Visit: Payer: Self-pay | Admitting: Registered Nurse

## 2020-07-31 DIAGNOSIS — Z1231 Encounter for screening mammogram for malignant neoplasm of breast: Secondary | ICD-10-CM

## 2020-08-01 ENCOUNTER — Ambulatory Visit
Admission: RE | Admit: 2020-08-01 | Discharge: 2020-08-01 | Disposition: A | Discharge status: Home / Self Care | Payer: Commercial Managed Care - PPO | Source: Ambulatory Visit | Attending: Registered Nurse | Admitting: Registered Nurse

## 2020-08-01 ENCOUNTER — Other Ambulatory Visit: Payer: Self-pay

## 2020-08-01 DIAGNOSIS — Z1231 Encounter for screening mammogram for malignant neoplasm of breast: Secondary | ICD-10-CM

## 2020-09-26 ENCOUNTER — Other Ambulatory Visit: Payer: Self-pay

## 2020-09-26 ENCOUNTER — Encounter: Payer: Self-pay | Admitting: Registered Nurse

## 2020-09-26 ENCOUNTER — Ambulatory Visit (INDEPENDENT_AMBULATORY_CARE_PROVIDER_SITE_OTHER): Payer: 59 | Admitting: Registered Nurse

## 2020-09-26 VITALS — BP 128/77 | HR 95 | Temp 98.0°F | Resp 18 | Ht 65.0 in | Wt 185.2 lb

## 2020-09-26 DIAGNOSIS — I4811 Longstanding persistent atrial fibrillation: Secondary | ICD-10-CM | POA: Diagnosis not present

## 2020-09-26 DIAGNOSIS — Z7901 Long term (current) use of anticoagulants: Secondary | ICD-10-CM

## 2020-09-26 DIAGNOSIS — I10 Essential (primary) hypertension: Secondary | ICD-10-CM | POA: Diagnosis not present

## 2020-09-26 NOTE — Patient Instructions (Signed)
° ° ° °  If you have lab work done today you will be contacted with your lab results within the next 2 weeks.  If you have not heard from us then please contact us. The fastest way to get your results is to register for My Chart. ° ° °IF you received an x-ray today, you will receive an invoice from [facility]. Please contact  Radiology at [phone] with questions or concerns regarding your invoice.  ° °IF you received labwork today, you will receive an invoice from [facility]. Please contact [facility] at [phone] with questions or concerns regarding your invoice.  ° °Our billing staff will not be able to assist you with questions regarding bills from these companies. ° °You will be contacted with the lab results as soon as they are available. The fastest way to get your results is to activate your My Chart account. Instructions are located on the last page of this paperwork. If you have not heard from us regarding the results in 2 weeks, please contact this office. °  ° ° ° °

## 2020-09-27 ENCOUNTER — Telehealth: Payer: Self-pay | Admitting: Registered Nurse

## 2020-09-27 ENCOUNTER — Other Ambulatory Visit: Payer: Self-pay | Admitting: Emergency Medicine

## 2020-09-27 DIAGNOSIS — I10 Essential (primary) hypertension: Secondary | ICD-10-CM

## 2020-09-27 DIAGNOSIS — I480 Paroxysmal atrial fibrillation: Secondary | ICD-10-CM

## 2020-09-27 MED ORDER — ATENOLOL-CHLORTHALIDONE 100-25 MG PO TABS: 1.0000 | ORAL_TABLET | Freq: Every day | ORAL | 3 refills | Status: DC

## 2020-09-27 MED ORDER — RIVAROXABAN 20 MG PO TABS: ORAL_TABLET | ORAL | 2 refills | Status: DC

## 2020-09-27 MED ORDER — FLECAINIDE ACETATE 50 MG PO TABS: 50.0000 mg | ORAL_TABLET | Freq: Two times a day (BID) | ORAL | 2 refills | Status: DC

## 2020-09-27 NOTE — Telephone Encounter (Signed)
09/27/2020 - PATIENT SAW Mandy Price ON 09/26/2020. HE SAID Mandy WAS TO SEND IN 3 OF HIS MEDICATION REFILLS BUT WAS NEVER SENT. PLEASE SEND THEM TODAY. [facility]ST [facility] IS: [facility] ON [address]. MBC

## 2020-09-27 NOTE — Telephone Encounter (Signed)
Rx has been refilled.  

## 2020-10-19 ENCOUNTER — Encounter: Payer: Self-pay | Admitting: Registered Nurse

## 2020-10-19 ENCOUNTER — Other Ambulatory Visit: Payer: Self-pay

## 2020-10-19 ENCOUNTER — Ambulatory Visit (INDEPENDENT_AMBULATORY_CARE_PROVIDER_SITE_OTHER): Payer: 59 | Admitting: Registered Nurse

## 2020-10-19 VITALS — BP 132/81 | HR 75 | Temp 98.0°F | Resp 18 | Ht 65.0 in | Wt 185.2 lb

## 2020-10-19 DIAGNOSIS — Z13220 Encounter for screening for lipoid disorders: Secondary | ICD-10-CM | POA: Diagnosis not present

## 2020-10-19 DIAGNOSIS — Z13228 Encounter for screening for other metabolic disorders: Secondary | ICD-10-CM

## 2020-10-19 DIAGNOSIS — Z130 Encounter for screening for diseases of the blood and blood-forming organs and certain disorders involving the immune mechanism: Secondary | ICD-10-CM

## 2020-10-19 DIAGNOSIS — I10 Essential (primary) hypertension: Secondary | ICD-10-CM

## 2020-10-19 DIAGNOSIS — Z1329 Encounter for screening for other suspected endocrine disorder: Secondary | ICD-10-CM | POA: Diagnosis not present

## 2020-10-19 DIAGNOSIS — Z0001 Encounter for general adult medical examination with abnormal findings: Secondary | ICD-10-CM

## 2020-10-19 DIAGNOSIS — I480 Paroxysmal atrial fibrillation: Secondary | ICD-10-CM

## 2020-10-19 DIAGNOSIS — Z0000 Encounter for general adult medical examination without abnormal findings: Secondary | ICD-10-CM

## 2020-10-19 MED ORDER — FLECAINIDE ACETATE 50 MG PO TABS: 50.0000 mg | ORAL_TABLET | Freq: Two times a day (BID) | ORAL | 2 refills | Status: DC

## 2020-10-19 MED ORDER — ATENOLOL-CHLORTHALIDONE 100-25 MG PO TABS
1.0000 | ORAL_TABLET | Freq: Every day | ORAL | 3 refills | Status: DC
Start: 2020-10-19 — End: 2021-12-30

## 2020-10-19 MED ORDER — RIVAROXABAN 20 MG PO TABS: ORAL_TABLET | ORAL | 2 refills | Status: DC

## 2020-10-19 NOTE — Patient Instructions (Addendum)
If you have lab work done today you will be contacted with your lab results within the next 2 weeks.  If you have not heard from [place] then please contact us. The fastest way to get your results is to register for My Chart.   IF you received an x-ray today, you will receive an invoice from [facility]. Please contact [facility] at [phone] with questions or concerns regarding your invoice.   IF you received labwork today, you will receive an invoice from [facility]. Please contact [facility] at [phone] with questions or concerns regarding your invoice.   Our billing staff will not be able to assist you with questions regarding bills from these companies.  You will be contacted with the lab results as soon as they are available. The fastest way to get your results is to activate your My Chart account. Instructions are located on the last page of this paperwork. If you have not heard from [place] regarding the results in 2 weeks, please contact this office.       Health Maintenance, Female Adopting a healthy lifestyle and getting preventive care are important in promoting health and wellness. Ask your health care provider about:  The right schedule for you to have regular tests and exams.  Things you can do on your own to prevent diseases and keep yourself healthy. What should I know about diet, weight, and exercise? Eat a healthy diet  Eat a diet that includes plenty of vegetables, fruits, low-fat dairy products, and lean protein.  Do not eat a lot of foods that are high in solid fats, added sugars, or sodium.   Maintain a healthy weight Body mass index (BMI) is used to identify weight problems. It estimates body fat based on height and weight. Your health care provider can help determine your BMI and help you achieve or maintain a healthy weight. Get regular exercise Get regular exercise. This is one of the most important things you can do for your health. Most  adults should:  Exercise for at least 150 minutes each week. The exercise should increase your heart rate and make you sweat (moderate-intensity exercise).  Do strengthening exercises at least twice a week. This is in addition to the moderate-intensity exercise.  Spend less time sitting. Even light physical activity can be beneficial. Watch cholesterol and blood lipids Have your blood tested for lipids and cholesterol at 65 years of age, then have this test every 5 years. Have your cholesterol levels checked more often if:  Your lipid or cholesterol levels are high.  You are older than 65 years of age.  You are at high risk for heart disease. What should I know about cancer screening? Depending on your health history and family history, you may need to have cancer screening at various ages. This may include screening for:  Breast cancer.  Cervical cancer.  Colorectal cancer.  Skin cancer.  Lung cancer. What should I know about heart disease, diabetes, and high blood pressure? Blood pressure and heart disease  High blood pressure causes heart disease and increases the risk of stroke. This is more likely to develop in people who have high blood pressure readings, are of African descent, or are overweight.  Have your blood pressure checked: ? Every 3-5 years if you are 48-15 years of age. ? Every year if you are 63 years old or older. Diabetes Have regular diabetes screenings. This checks your fasting blood sugar level. Have the screening done:  Once every three years after age 19 if you are at a normal weight and have a low risk for diabetes.  More often and at a younger age if you are overweight or have a high risk for diabetes. What should I know about preventing infection? Hepatitis B If you have a higher risk for hepatitis B, you should be screened for this virus. Talk with your health care provider to find out if you are at risk for hepatitis B infection. Hepatitis  C Testing is recommended for:  Everyone born from [birth year] through 1965.  Anyone with known risk factors for hepatitis C. Sexually transmitted infections (STIs)  Get screened for STIs, including gonorrhea and chlamydia, if: ? You are sexually active and are younger than 65 years of age. ? You are older than 65 years of age and your health care provider tells you that you are at risk for this type of infection. ? Your sexual activity has changed since you were last screened, and you are at increased risk for chlamydia or gonorrhea. Ask your health care provider if you are at risk.  Ask your health care provider about whether you are at high risk for HIV. Your health care provider may recommend a prescription medicine to help prevent HIV infection. If you choose to take medicine to prevent HIV, you should first get tested for HIV. You should then be tested every 3 months for as long as you are taking the medicine. Pregnancy  If you are about to stop having your period (premenopausal) and you may become pregnant, seek counseling before you get pregnant.  Take 400 to 800 micrograms (mcg) of folic acid every day if you become pregnant.  Ask for birth control (contraception) if you want to prevent pregnancy. Osteoporosis and menopause Osteoporosis is a disease in which the bones lose minerals and strength with aging. This can result in bone fractures. If you are 39 years old or older, or if you are at risk for osteoporosis and fractures, ask your health care provider if you should:  Be screened for bone loss.  Take a calcium or vitamin D supplement to lower your risk of fractures.  Be given hormone replacement therapy (HRT) to treat symptoms of menopause. Follow these instructions at home: Lifestyle  Do not use any products that contain nicotine or tobacco, such as cigarettes, e-cigarettes, and chewing tobacco. If you need help quitting, ask your health care provider.  Do not use street  drugs.  Do not share needles.  Ask your health care provider for help if you need support or information about quitting drugs. Alcohol use  Do not drink alcohol if: ? Your health care provider tells you not to drink. ? You are pregnant, may be pregnant, or are planning to become pregnant.  If you drink alcohol: ? Limit how much you use to 0-1 drink a day. ? Limit intake if you are breastfeeding.  Be aware of how much alcohol is in your drink. In the [place] one drink equals one 12 oz bottle of beer (355 mL), one 5 oz glass of wine (148 mL), or one 1 oz glass of hard liquor (44 mL). General instructions  Schedule regular health, dental, and eye exams.  Stay current with your vaccines.  Tell your health care provider if: ? You often feel depressed. ? You have ever been abused or do not feel safe at home. Summary  Adopting a healthy lifestyle and getting preventive care are important in promoting health  and wellness.  Follow your health care provider's instructions about healthy diet, exercising, and getting tested or screened for diseases.  Follow your health care provider's instructions on monitoring your cholesterol and blood pressure. This information is not intended to replace advice given to you by your health care provider. Make sure you discuss any questions you have with your health care provider. Document Revised: 07/07/2018 Document Reviewed: 07/07/2018 Elsevier Patient Education  2021 Elsevier Inc.     Why follow it? Research shows. . Those who follow the Mediterranean diet have a reduced risk of heart disease  . The diet is associated with a reduced incidence of Parkinson's and Alzheimer's diseases . People following the diet may have longer life expectancies and lower rates of chronic diseases  . The Dietary Guidelines for Americans recommends the Mediterranean diet as an eating plan to promote health and prevent disease  What Is the Mediterranean Diet?   . Healthy eating plan based on typical foods and recipes of Mediterranean-style cooking . The diet is primarily a plant based diet; these foods should make up a majority of meals   Starches - Plant based foods should make up a majority of meals - They are an important sources of vitamins, minerals, energy, antioxidants, and fiber - Choose whole grains, foods high in fiber and minimally processed items  - Typical grain sources include wheat, oats, barley, corn, brown rice, bulgar, farro, millet, polenta, couscous  - Various types of beans include chickpeas, lentils, fava beans, black beans, white beans   Fruits  Veggies - Large quantities of antioxidant rich fruits & veggies; 6 or more servings  - Vegetables can be eaten raw or lightly drizzled with oil and cooked  - Vegetables common to the traditional Mediterranean Diet include: artichokes, arugula, beets, broccoli, brussel sprouts, cabbage, carrots, celery, collard greens, cucumbers, eggplant, kale, leeks, lemons, lettuce, mushrooms, okra, onions, peas, peppers, potatoes, pumpkin, radishes, rutabaga, shallots, spinach, sweet potatoes, turnips, zucchini - Fruits common to the Mediterranean Diet include: apples, apricots, avocados, cherries, clementines, dates, figs, grapefruits, grapes, melons, nectarines, oranges, peaches, pears, pomegranates, strawberries, tangerines  Fats - Replace butter and margarine with healthy oils, such as olive oil, canola oil, and tahini  - Limit nuts to no more than a handful a day  - Nuts include walnuts, almonds, pecans, pistachios, pine nuts  - Limit or avoid candied, honey roasted or heavily salted nuts - Olives are central to the [employer] - can be eaten whole or used in a variety of dishes   Meats Protein - Limiting red meat: no more than a few times a month - When eating red meat: choose lean cuts and keep the portion to the size of deck of cards - Eggs: approx. 0 to 4 times a week  - Fish and lean  poultry: at least 2 a week  - Healthy protein sources include, chicken, [place], lean beef, lamb - Increase intake of seafood such as tuna, salmon, trout, mackerel, shrimp, scallops - Avoid or limit high fat processed meats such as sausage and bacon  Dairy - Include moderate amounts of low fat dairy products  - Focus on healthy dairy such as fat free yogurt, skim milk, low or reduced fat cheese - Limit dairy products higher in fat such as whole or 2% milk, cheese, ice cream  Alcohol - Moderate amounts of red wine is ok  - No more than 5 oz daily for women (all ages) and men older than age 53  -  No more than 10 oz of wine daily for men younger than 42  Other - Limit sweets and other desserts  - Use herbs and spices instead of salt to flavor foods  - Herbs and spices common to the traditional Mediterranean Diet include: basil, bay leaves, chives, cloves, cumin, fennel, garlic, lavender, marjoram, mint, oregano, parsley, pepper, rosemary, sage, savory, sumac, tarragon, thyme   It's not just a diet, it's a lifestyle:  . The Mediterranean diet includes lifestyle factors typical of those in the region  . Foods, drinks and meals are best eaten with others and savored . Daily physical activity is important for overall good health . This could be strenuous exercise like running and aerobics . This could also be more leisurely activities such as walking, housework, yard-work, or taking the stairs . Moderation is the key; a balanced and healthy diet accommodates most foods and drinks . Consider portion sizes and frequency of consumption of certain foods   Meal Ideas & Options:  . Breakfast:  o Whole wheat toast or whole wheat English muffins with peanut butter & hard boiled egg o Steel cut oats topped with apples & cinnamon and skim milk  o Fresh fruit: banana, strawberries, melon, berries, peaches  o Smoothies: strawberries, bananas, greek yogurt, peanut butter o Low fat greek yogurt with  blueberries and granola  o Egg white omelet with spinach and mushrooms o Breakfast couscous: whole wheat couscous, apricots, skim milk, cranberries  . Sandwiches:  o Hummus and grilled vegetables (peppers, zucchini, squash) on whole wheat bread   o Grilled chicken on whole wheat pita with lettuce, tomatoes, cucumbers or tzatziki  o Tuna salad on whole wheat bread: tuna salad made with greek yogurt, olives, red peppers, capers, green onions o Garlic rosemary lamb pita: lamb sauted with garlic, rosemary, salt & pepper; add lettuce, cucumber, greek yogurt to pita - flavor with lemon juice and black pepper  . Seafood:  o Mediterranean grilled salmon, seasoned with garlic, basil, parsley, lemon juice and black pepper o Shrimp, lemon, and spinach whole-grain pasta salad made with low fat greek yogurt  o Seared scallops with lemon orzo  o Seared tuna steaks seasoned salt, pepper, coriander topped with tomato mixture of olives, tomatoes, olive oil, minced garlic, parsley, green onions and cappers  . Meats:  o Herbed greek chicken salad with kalamata olives, cucumber, feta  o Red bell peppers stuffed with spinach, bulgur, lean ground beef (or lentils) & topped with feta   o Kebabs: skewers of chicken, tomatoes, onions, zucchini, squash  o [place] burgers: made with red onions, mint, dill, lemon juice, feta cheese topped with roasted red peppers . Vegetarian o Cucumber salad: cucumbers, artichoke hearts, celery, red onion, feta cheese, tossed in olive oil & lemon juice  o Hummus and whole grain pita points with a greek salad (lettuce, tomato, feta, olives, cucumbers, red onion) o Lentil soup with celery, carrots made with vegetable broth, garlic, salt and pepper  o Tabouli salad: parsley, bulgur, mint, scallions, cucumbers, tomato, radishes, lemon juice, olive oil, salt and pepper.       Fat and Cholesterol Restricted Eating Plan Eating a diet that limits fat and cholesterol may help lower your risk  for heart disease and other conditions. Your body needs fat and cholesterol for basic functions, but eating too much of these things can be harmful to your health. Your health care provider may order lab tests to check your blood fat (lipid) and cholesterol levels. This helps your  health care provider understand your risk for certain conditions and whether you need to make diet changes. Work with your health care provider or dietitian to make an eating plan that is right for you. Your plan includes:  Limit your fat intake to ______% or less of your total calories a day.  Limit your saturated fat intake to ______% or less of your total calories a day.  Limit the amount of cholesterol in your diet to less than _________mg a day.  Eat ___________ g of fiber a day. What are tips for following this plan? General guidelines  If you are overweight, work with your health care provider to lose weight safely. Losing just 5-10% of your body weight can improve your overall health and help prevent diseases such as diabetes and heart disease.  Avoid: ? Foods with added sugar. ? Fried foods. ? Foods that contain partially hydrogenated oils, including stick margarine, some tub margarines, cookies, crackers, and other baked goods.  Limit alcohol intake to no more than 1 drink a day for nonpregnant women and 2 drinks a day for men. One drink equals 12 oz of beer, 5 oz of wine, or 1 oz of hard liquor.   Reading food labels  Check food labels for: ? Trans fats, partially hydrogenated oils, or high amounts of saturated fat. Avoid foods that contain saturated fat and trans fat. ? The amount of cholesterol in each serving. Try to eat no more than 200 mg of cholesterol each day. ? The amount of fiber in each serving. Try to eat at least 20-30 g of fiber each day.  Choose foods with healthy fats, such as: ? Monounsaturated and polyunsaturated fats. These include olive and canola oil, flaxseeds, walnuts,  almonds, and seeds. ? Omega-3 fats. These are found in foods such as salmon, mackerel, sardines, tuna, flaxseed oil, and ground flaxseeds.  Choose grain products that have whole grains. Look for the word "whole" as the first word in the ingredient list. Cooking  Cook foods using methods other than frying. Baking, boiling, grilling, and broiling are some healthy options.  Eat more home-cooked food and less restaurant, buffet, and fast food.  Avoid cooking using saturated fats. ? Animal sources of saturated fats include meats, butter, and cream. ? Plant sources of saturated fats include palm oil, palm kernel oil, and coconut oil. Meal planning  At meals, imagine dividing your plate into fourths: ? Fill one-half of your plate with vegetables and green salads. ? Fill one-fourth of your plate with whole grains. ? Fill one-fourth of your plate with lean protein foods.  Eat fish that is high in omega-3 fats at least two times a week.  Eat more foods that contain fiber, such as whole grains, beans, apples, broccoli, carrots, peas, and barley. These foods help promote healthy cholesterol levels in the blood.   Recommended foods Grains  Whole grains, such as whole wheat or whole grain breads, crackers, cereals, and pasta. Unsweetened oatmeal, bulgur, barley, quinoa, or brown rice. Corn or whole wheat flour tortillas. Vegetables  Fresh or frozen vegetables (raw, steamed, roasted, or grilled). Green salads. Fruits  All fresh, canned (in natural juice), or frozen fruits. Meats and other protein foods  Ground beef (85% or leaner), grass-fed beef, or beef trimmed of fat. Skinless chicken or [place]. Ground chicken or [place]. Pork trimmed of fat. All fish and seafood. Egg whites. Dried beans, peas, or lentils. Unsalted nuts or seeds. Unsalted canned beans. Natural nut butters without added sugar and  oil. Dairy  Low-fat or nonfat dairy products, such as skim or 1% milk, 2% or reduced-fat cheeses,  low-fat and fat-free ricotta or cottage cheese, or plain low-fat and nonfat yogurt. Fats and oils  Tub margarine without trans fats. Light or reduced-fat mayonnaise and salad dressings. Avocado. Olive, canola, sesame, or safflower oils. The items listed above may not be a complete list of foods and beverages you can eat. Contact a dietitian for more information. Foods to avoid Grains  White bread. White pasta. White rice. Cornbread. Bagels, pastries, and croissants. Crackers and snack foods that contain trans fat and hydrogenated oils. Vegetables  Vegetables cooked in cheese, cream, or butter sauce. Fried vegetables. Fruits  Canned fruit in heavy syrup. Fruit in cream or butter sauce. Fried fruit. Meats and other protein foods  Fatty cuts of meat. Ribs, chicken wings, bacon, sausage, bologna, salami, chitterlings, fatback, hot dogs, bratwurst, and packaged lunch meats. Liver and organ meats. Whole eggs and egg yolks. Chicken and [place] with skin. Fried meat. Dairy  Whole or 2% milk, cream, half-and-half, and cream cheese. Whole milk cheeses. Whole-fat or sweetened yogurt. Full-fat cheeses. Nondairy creamers and whipped toppings. Processed cheese, cheese spreads, and cheese curds. Beverages  Alcohol. Sugar-sweetened drinks such as sodas, lemonade, and fruit drinks. Fats and oils  Butter, stick margarine, lard, shortening, ghee, or bacon fat. Coconut, palm kernel, and palm oils. Sweets and desserts  Corn syrup, sugars, honey, and molasses. Candy. Jam and jelly. Syrup. Sweetened cereals. Cookies, pies, cakes, donuts, muffins, and ice cream. The items listed above may not be a complete list of foods and beverages you should avoid. Contact a dietitian for more information. Summary  Your body needs fat and cholesterol for basic functions. However, eating too much of these things can be harmful to your health.  Work with your health care provider and dietitian to follow a diet low in fat  and cholesterol. Doing this may help lower your risk for heart disease and other conditions.  Choose healthy fats, such as monounsaturated and polyunsaturated fats, and foods high in omega-3 fatty acids.  Eat fiber-rich foods, such as whole grains, beans, peas, fruits, and vegetables.  Limit or avoid alcohol, fried foods, and foods high in saturated fats, partially hydrogenated oils, and sugar. This information is not intended to replace advice given to you by your health care provider. Make sure you discuss any questions you have with your health care provider. Document Revised: 03/14/2020 Document Reviewed: 11/16/2019 Elsevier Patient Education  2021 Elsevier Inc.  American Heart Association [facility]) Exercise Recommendation  Being physically active is important to prevent heart disease and stroke, the nation's No. 1and No. 5killers. To improve overall cardiovascular health, we suggest at least 150 minutes per week of moderate exercise or 75 minutes per week of vigorous exercise (or a combination of moderate and vigorous activity). Thirty minutes a day, five times a week is an easy goal to remember. You will also experience benefits even if you divide your time into two or three segments of 10 to 15 minutes per day.  For people who would benefit from lowering their blood pressure or cholesterol, we recommend 40 minutes of aerobic exercise of moderate to vigorous intensity three to four times a week to lower the risk for heart attack and stroke.  Physical activity is anything that makes you move your body and burn calories.  This includes things like climbing stairs or playing sports. Aerobic exercises benefit your heart, and include walking, jogging, swimming or  biking. Strength and stretching exercises are best for overall stamina and flexibility.  The simplest, positive change you can make to effectively improve your heart health is to start walking. It's enjoyable, free, easy, social and great  exercise. A walking program is flexible and boasts high success rates because people can stick with it. It's easy for walking to become a regular and satisfying part of life.   For Overall Cardiovascular Health:  At least 30 minutes of moderate-intensity aerobic activity at least 5 days per week for a total of 150  OR   At least 25 minutes of vigorous aerobic activity at least 3 days per week for a total of 75 minutes; or a combination of moderate- and vigorous-intensity aerobic activity  AND   Moderate- to high-intensity muscle-strengthening activity at least 2 days per week for additional health benefits.  For Lowering Blood Pressure and Cholesterol  An average 40 minutes of moderate- to vigorous-intensity aerobic activity 3 or 4 times per week  What if I can't make it to the time goal? Something is always better than nothing! And everyone has to start somewhere. Even if you've been sedentary for years, today is the day you can begin to make healthy changes in your life. If you don't think you'll make it for 30 or 40 minutes, set a reachable goal for today. You can work up toward your overall goal by increasing your time as you get stronger. Don't let all-or-nothing thinking rob you of doing what you can every day.  Source:http://www.heart.org

## 2020-10-19 NOTE — Progress Notes (Signed)
Established Patient Office Visit  Subjective:  Patient ID: Mandy Price, female    DOB: Nov 02, 1955  Age: 65 y.o. MRN: [id number]  CC:  Chief Complaint  Patient presents with  . Annual Exam    Patient states she is here for an CPE and discuss medication refill amount.    HPI Markita Reali presents for CPE and med refills.  No acute concerns No updates to history  Sad that [place] is closing - will likely change providers, thinking of Dr. Alvy Bimler at [facility].  Past Medical History:  Diagnosis Date  . A-fib (HCC)   . Hypertension     Past Surgical History:  Procedure Laterality Date  . None Surgeries      No family history on file.  Social History   Socioeconomic History  . Marital status: Married    Spouse name: Not on file  . Number of children: 4  . Years of education: Not on file  . Highest education level: Not on file  Occupational History  . Not on file  Tobacco Use  . Smoking status: Never Smoker  . Smokeless tobacco: Never Used  Vaping Use  . Vaping Use: Never used  Substance and Sexual Activity  . Alcohol use: No  . Drug use: No  . Sexual activity: Yes    Partners: Male  Other Topics Concern  . 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 atenolol-chlorthalidone (TENORETIC) 100-25 MG tablet Take 1 tablet by mouth daily. 90 tablet 3  . flecainide (TAMBOCOR) 50 MG tablet Take 1 tablet (50 mg total) by mouth 2 (two) times daily. 180 tablet 2  . OMEGA-3 KRILL OIL PO Take by mouth daily.    . rivaroxaban (XARELTO) 20 MG TABS tablet TAKE 1 TABLET DAILY WITH  SUPPER 90 tablet 2  . cyclobenzaprine (FLEXERIL) 5 MG tablet Take 1 tablet (5 mg total) by mouth 3 (three) times daily as needed for muscle spasms. (Patient not taking: No sig reported) 90 tablet 0   No facility-administered medications prior to visit.    No Known Allergies  ROS Review of Systems  Constitutional: Negative.   HENT: Negative.   Eyes: Negative.   Respiratory: Negative.   Cardiovascular: Negative.   Gastrointestinal: Negative.   Genitourinary: Negative.   Musculoskeletal: Negative.   Skin: Negative.   Neurological: Negative.   Psychiatric/Behavioral: Negative.   All other systems reviewed and are negative.     Objective:    Physical Exam Vitals and nursing note reviewed.  Constitutional:      General: She is not in acute distress.    Appearance: Normal appearance. She is normal weight. She is not ill-appearing, toxic-appearing or diaphoretic.  Cardiovascular:     Rate and Rhythm: Normal rate. Rhythm irregular.     Heart sounds: Normal heart sounds. No murmur heard. No friction rub. No gallop.      Comments: Occasional skipped beat Pulmonary:     Effort: Pulmonary effort is normal. No respiratory distress.     Breath sounds: Normal breath sounds. No stridor. No  wheezing, rhonchi or rales.  Chest:     Chest wall: No tenderness.  Skin:    General: Skin is warm and dry.  Neurological:     General: No focal deficit present.     Mental Status: She is alert and oriented to person, place, and time. Mental status is at baseline.  Psychiatric:        Mood and Affect: Mood normal.        Behavior: Behavior normal.        Thought Content: Thought content normal.        Judgment: Judgment normal.     BP 132/81   Pulse 75   Temp 98 F (36.7 C) (Temporal)   Resp 18   Ht 5\' 5"  (1.651 m)   Wt 185 lb 3.2 oz (84 kg)   SpO2 100%   BMI 30.82 kg/m  Wt Readings from Last 3 Encounters:  10/19/20 185 lb 3.2 oz (84 kg)  09/26/20 185 lb 3.2 oz (84 kg)  07/03/20  180 lb (81.6 kg)     There are no preventive care reminders to display for this patient.  There are no preventive care reminders to display for this patient.  Lab Results  Component Value Date   TSH 1.610 06/18/2020   Lab Results  Component Value Date   WBC 7.3 06/18/2020   HGB 11.7 06/18/2020   HCT 37.2 06/18/2020   MCV 78 (L) 06/18/2020   PLT 206 06/18/2020   Lab Results  Component Value Date   NA 140 06/18/2020   K 3.4 (L) 06/18/2020   CO2 26 06/18/2020   GLUCOSE 94 06/18/2020   BUN 21 06/18/2020   CREATININE 0.86 06/18/2020   BILITOT 0.2 06/18/2020   ALKPHOS 67 06/18/2020   AST 38 06/18/2020   ALT 33 (H) 06/18/2020   PROT 7.1 06/18/2020   ALBUMIN 4.5 06/18/2020   CALCIUM 9.5 06/18/2020   Lab Results  Component Value Date   CHOL 190 06/18/2020   Lab Results  Component Value Date   HDL 69 06/18/2020   Lab Results  Component Value Date   LDLCALC 104 (H) 06/18/2020   Lab Results  Component Value Date   TRIG 94 06/18/2020   Lab Results  Component Value Date   CHOLHDL 2.8 06/18/2020   Lab Results  Component Value Date   HGBA1C 5.7 (H) 06/18/2020      Assessment & Plan:   Problem List Items Addressed This Visit   None   Visit Diagnoses    Routine general medical examination at a health care facility    -  Primary   Screening for endocrine, metabolic and immunity disorder       Relevant Orders   CBC With Differential   Comprehensive metabolic panel   Hemoglobin A1c   TSH   Lipid screening       Relevant Orders   Lipid panel      No orders of the defined types were placed in this encounter.   Follow-up: Return in 6 months (on 04/21/2021).   PLAN  Refills x 6 mo  Return for med check  Lab orders placed  Patient encouraged to call clinic with any questions, comments, or concerns.  04/23/2021, NP

## 2020-12-06 ENCOUNTER — Telehealth: Payer: Self-pay | Admitting: Registered Nurse

## 2020-12-06 NOTE — Telephone Encounter (Signed)
She's very pleasant. Does not need much often. I think she'd be a good fit  Thank you  Rich

## 2020-12-06 NOTE — Telephone Encounter (Signed)
I am okay with this transfer of care.  Thanks.

## 2020-12-06 NOTE — Telephone Encounter (Signed)
Patient is requesting to be a TOC to Dr. Sagardia. Please advise  

## 2020-12-18 ENCOUNTER — Ambulatory Visit: Payer: Self-pay | Admitting: Registered Nurse

## 2021-02-04 ENCOUNTER — Ambulatory Visit (INDEPENDENT_AMBULATORY_CARE_PROVIDER_SITE_OTHER): Payer: 59 | Admitting: Emergency Medicine

## 2021-02-04 ENCOUNTER — Encounter: Payer: Self-pay | Admitting: Emergency Medicine

## 2021-02-04 ENCOUNTER — Other Ambulatory Visit: Payer: Self-pay

## 2021-02-04 VITALS — BP 132/82 | HR 100 | Temp 98.1°F | Ht 65.0 in | Wt 189.0 lb

## 2021-02-04 DIAGNOSIS — Z7901 Long term (current) use of anticoagulants: Secondary | ICD-10-CM | POA: Diagnosis not present

## 2021-02-04 DIAGNOSIS — Z7689 Persons encountering health services in other specified circumstances: Secondary | ICD-10-CM

## 2021-02-04 DIAGNOSIS — D6859 Other primary thrombophilia: Secondary | ICD-10-CM | POA: Diagnosis not present

## 2021-02-04 DIAGNOSIS — I4811 Longstanding persistent atrial fibrillation: Secondary | ICD-10-CM

## 2021-02-04 DIAGNOSIS — I10 Essential (primary) hypertension: Secondary | ICD-10-CM

## 2021-02-04 NOTE — Patient Instructions (Signed)
Atrial Fibrillation °Atrial fibrillation is a type of heartbeat that is irregular or fast. If you have this condition, your heart beats without any order. This makes it hard for your heart to pump blood in a normal way. °Atrial fibrillation may come and go, or it may become a long-lasting problem. If this condition is not treated, it can put you at higher risk for stroke, heart failure, and other heart problems. °What are the causes? °This condition may be caused by diseases that damage the heart. They include: °High blood pressure. °Heart failure. °Heart valve disease. °Heart surgery. °Other causes include: °Diabetes. °Thyroid disease. °Being overweight. °Kidney disease. °Sometimes the cause is not known. °What increases the risk? °You are more likely to develop this condition if: °You are older. °You smoke. °You exercise often and very hard. °You have a family history of this condition. °You are a man. °You use drugs. °You drink a lot of alcohol. °You have lung conditions, such as emphysema, pneumonia, or COPD. °You have sleep apnea. °What are the signs or symptoms? °Common symptoms of this condition include: °A feeling that your heart is beating very fast. °Chest pain or discomfort. °Feeling short of breath. °Suddenly feeling light-headed or weak. °Getting tired easily during activity. °Fainting. °Sweating. °In some cases, there are no symptoms. °How is this treated? °Treatment for this condition depends on underlying conditions and how you feel when you have atrial fibrillation. They include: °Medicines to: °Prevent blood clots. °Treat heart rate or heart rhythm problems. °Using devices, such as a pacemaker, to correct heart rhythm problems. °Doing surgery to remove the part of the heart that sends bad signals. °Closing an area where clots can form in the heart (left atrial appendage). °In some cases, your doctor will treat other underlying conditions. °Follow these instructions at home: °Medicines °Take  over-the-counter and prescription medicines only as told by your doctor. °Do not take any new medicines without first talking to your doctor. °If you are taking blood thinners: °Talk with your doctor before you take any medicines that have aspirin or NSAIDs, such as ibuprofen, in them. °Take your medicine exactly as told by your doctor. Take it at the same time each day. °Avoid activities that could hurt or bruise you. Follow instructions about how to prevent falls. °Wear a bracelet that says you are taking blood thinners. Or, carry a card that lists what medicines you take. °Lifestyle °  °Do not use any products that have nicotine or tobacco in them. These include cigarettes, e-cigarettes, and chewing tobacco. If you need help quitting, ask your doctor. °Eat heart-healthy foods. Talk with your doctor about the right eating plan for you. °Exercise regularly as told by your doctor. °Do not drink alcohol. °Lose weight if you are overweight. °Do not use drugs, including cannabis. °General instructions °If you have a condition that causes breathing to stop for a short period of time (apnea), treat it as told by your doctor. °Keep a healthy weight. Do not use diet pills unless your doctor says they are safe for you. Diet pills may make heart problems worse. °Keep all follow-up visits as told by your doctor. This is important. °Contact a doctor if: °You notice a change in the speed, rhythm, or strength of your heartbeat. °You are taking a blood-thinning medicine and you get more bruising. °You get tired more easily when you move or exercise. °You have a sudden change in weight. °Get help right away if: ° °You have pain in your chest or   your belly (abdomen). °You have trouble breathing. °You have side effects of blood thinners, such as blood in your vomit, poop (stool), or pee (urine), or bleeding that cannot stop. °You have any signs of a stroke. "BE FAST" is an easy way to remember the main warning signs: °B - Balance.  Signs are dizziness, sudden trouble walking, or loss of balance. °E - Eyes. Signs are trouble seeing or a change in how you see. °F - Face. Signs are sudden weakness or loss of feeling in the face, or the face or eyelid drooping on one side. °A - Arms. Signs are weakness or loss of feeling in an arm. This happens suddenly and usually on one side of the body. °S - Speech. Signs are sudden trouble speaking, slurred speech, or trouble understanding what people say. °T - Time. Time to call emergency services. Write down what time symptoms started. °You have other signs of a stroke, such as: °A sudden, very bad headache with no known cause. °Feeling like you may vomit (nausea). °Vomiting. °A seizure. °These symptoms may be an emergency. Do not wait to see if the symptoms will go away. Get medical help right away. Call your local emergency services (911 in the [place] Do not drive yourself to the hospital. °Summary °Atrial fibrillation is a type of heartbeat that is irregular or fast. °You are at higher risk of this condition if you smoke, are older, have diabetes, or are overweight. °Follow your doctor's instructions about medicines, diet, exercise, and follow-up visits. °Get help right away if you have signs or symptoms of a stroke. °Get help right away if you cannot catch your breath, or you have chest pain or discomfort. °This information is not intended to replace advice given to you by your health care provider. Make sure you discuss any questions you have with your health care provider. °Document Revised: 01/05/2019 Document Reviewed: 01/05/2019 °Elsevier Patient Education © 2022 Elsevier Inc. ° °

## 2021-02-04 NOTE — Assessment & Plan Note (Signed)
Well-controlled.  Continue Tenoretic 100--25 mg daily.

## 2021-02-04 NOTE — Progress Notes (Addendum)
Mandy Price 65 y.o.   Chief Complaint  Patient presents with   Transitions Of Care    Establish care    HISTORY OF PRESENT ILLNESS: This is a 65 y.o. female former patient of Mandy Price, here to establish care with me. Has the following chronic medical problems: 1.  Hypertension: On Tenoretic 100-25 mg daily 2.  Chronic atrial fibrillation: On Tambocor 50 mg twice a day 3.  Long-term anticoagulation on Xarelto 20 mg daily. Up-to-date with both mammogram and colonoscopy. No complaints or any other medical concerns.  HPI   Prior to Admission medications   Medication Sig Start Date End Date Taking? Authorizing Provider  atenolol-chlorthalidone (TENORETIC) 100-25 MG tablet Take 1 tablet by mouth daily. 10/19/20  Yes Mandy Agee, NP  flecainide (TAMBOCOR) 50 MG tablet Take 1 tablet (50 mg total) by mouth 2 (two) times daily. 10/19/20  Yes Mandy Agee, NP  OMEGA-3 KRILL OIL PO Take by mouth daily.   Yes [provider]  rivaroxaban (XARELTO) 20 MG TABS tablet TAKE 1 TABLET DAILY WITH SUPPER 10/19/20  Yes Mandy Agee, NP    No Known Allergies  Patient Active Problem List   Diagnosis Date Noted   Rapid heartbeat 02/10/2019   Atrial flutter (HCC) 02/10/2019   Atrial fibrillation (HCC) 03/09/2017   Back pain, thoracic 04/28/2012   Obesity (BMI 30.0-34.9) 04/28/2012   HTN (hypertension) 01/22/2012    Past Medical History:  Diagnosis Date   A-fib (HCC)    Hypertension     Past Surgical History:  Procedure Laterality Date   None Surgeries      Social History   Socioeconomic History   Marital status: Married    Spouse name: Not on file   Number of children: 4   Years of education: Not on file   Highest education level: Not on file  Occupational History   Not on file  Tobacco Use   Smoking status: Never   Smokeless tobacco: Never  Vaping Use   Vaping Use: Never used  Substance and Sexual Activity   Alcohol use: No   Drug use: No   Sexual  activity: Yes    Partners: Male  Other Topics Concern   Not on file  Social History Narrative   Married   4 grown children   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 No pertinent family history.   Review of Systems  Constitutional: Negative.  Negative for chills and fever.  HENT: Negative.  Negative for congestion and sore throat.   Respiratory: Negative.  Negative for cough and hemoptysis.   Cardiovascular:  Positive for palpitations.  Gastrointestinal:  Negative for abdominal pain, diarrhea, nausea and vomiting.  Genitourinary: Negative.  Negative for dysuria.  Skin: Negative.  Negative for rash.  Neurological:  Negative for dizziness and headaches.  All other systems reviewed and are negative.  Vitals:   02/04/21 1304  BP: 132/82  Pulse: 100  Temp: 98.1 F (36.7 C)  SpO2: 99%   Acute Physical Exam Vitals reviewed.  Constitutional:      Appearance: Normal appearance.  HENT:     Head: Normocephalic and atraumatic.  Eyes:     Extraocular Movements: Extraocular movements intact.  Conjunctiva/sclera: Conjunctivae normal.     Pupils: Pupils are equal, round, and reactive to light.  Cardiovascular:     Rate and Rhythm: Normal rate. Rhythm irregular.     Pulses: Normal pulses.     Heart sounds: Normal heart sounds.  Pulmonary:     Breath sounds: Normal breath sounds.  Musculoskeletal:     Cervical back: Normal range of motion and neck supple.     Right lower leg: No edema.     Left lower leg: No edema.  Skin:    General: Skin is warm and dry.     Capillary Refill: Capillary refill takes less than 2 seconds.  Neurological:     General: No focal deficit present.     Mental Status: She is alert and  oriented to person, place, and time.  Psychiatric:        Mood and Affect: Mood normal.        Behavior: Behavior normal.     ASSESSMENT & PLAN: A total of 30 minutes was spent with the patient and counseling/coordination of care regarding preparing for this visit, review of most recent office visit notes, review of most recent blood work results, review of all medications, establishing care with me, review of past medical history and multiple chronic medical problems, long-term anticoagulation and fall precautions, health maintenance items, education and nutrition, prognosis, documentation and need for follow-up.  Essential hypertension Well-controlled.  Continue Tenoretic 100--25 mg daily.  Atrial fibrillation (HCC) Persistent chronic atrial fibrillation.  On both rate and rhythm control medications. Continue Tenoretic and Tambocor 50 mg twice a day.  Current use of long term anticoagulation Fall precautions given.  Continue Xarelto 20 mg daily.  Mandy Price was seen today for transitions of care.  Diagnoses and all orders for this visit:  Longstanding persistent atrial fibrillation (HCC)  Essential hypertension  Current use of long term anticoagulation  Thrombophilia (HCC)  Encounter to establish care  Patient Instructions  Atrial Fibrillation  Atrial fibrillation is a type of heartbeat that is irregular or fast. If you have this condition, your heart beats without any order. This makes it hard foryour heart to pump blood in a normal way. Atrial fibrillation may come and go, or it may become a long-lasting problem. If this condition is not treated, it can put you at higher risk for stroke,heart failure, and other heart problems. What are the causes? This condition may be caused by diseases that damage the heart. They include: High blood pressure. Heart failure. Heart valve disease. Heart surgery. Other causes include: Diabetes. Thyroid disease. Being overweight. Kidney  disease. Sometimes the cause is not known. What increases the risk? You are more likely to develop this condition if: You are older. You smoke. You exercise often and very hard. You have a family history of this condition. You are a man. You use drugs. You drink a lot of alcohol. You have lung conditions, such as emphysema, pneumonia, or COPD. You have sleep apnea. What are the signs or symptoms? Common symptoms of this condition include: A feeling that your heart is beating very fast. Chest pain or discomfort. Feeling short of breath. Suddenly feeling light-headed or weak. Getting tired easily during activity. Fainting. Sweating. In some cases, there are no symptoms. How is this treated? Treatment for this condition depends on underlying conditions and how you feel when you have atrial fibrillation. They include: Medicines to: Prevent blood clots. Treat heart rate or heart rhythm problems. Using devices, such as a pacemaker,  to correct heart rhythm problems. Doing surgery to remove the part of the heart that sends bad signals. Closing an area where clots can form in the heart (left atrial appendage). In some cases, your doctor will treat other underlying conditions. Follow these instructions at home: Medicines Take over-the-counter and prescription medicines only as told by your doctor. Do not take any new medicines without first talking to your doctor. If you are taking blood thinners: Talk with your doctor before you take any medicines that have aspirin or NSAIDs, such as ibuprofen, in them. Take your medicine exactly as told by your doctor. Take it at the same time each day. Avoid activities that could hurt or bruise you. Follow instructions about how to prevent falls. Wear a bracelet that says you are taking blood thinners. Or, carry a card that lists what medicines you take. Lifestyle     Do not use any products that have nicotine or tobacco in them. These include  cigarettes, e-cigarettes, and chewing tobacco. If you need help quitting, ask your doctor. Eat heart-healthy foods. Talk with your doctor about the right eating plan for you. Exercise regularly as told by your doctor. Do not drink alcohol. Lose weight if you are overweight. Do not use drugs, including cannabis. General instructions If you have a condition that causes breathing to stop for a short period of time (apnea), treat it as told by your doctor. Keep a healthy weight. Do not use diet pills unless your doctor says they are safe for you. Diet pills may make heart problems worse. Keep all follow-up visits as told by your doctor. This is important. Contact a doctor if: You notice a change in the speed, rhythm, or strength of your heartbeat. You are taking a blood-thinning medicine and you get more bruising. You get tired more easily when you move or exercise. You have a sudden change in weight. Get help right away if:  You have pain in your chest or your belly (abdomen). You have trouble breathing. You have side effects of blood thinners, such as blood in your vomit, poop (stool), or pee (urine), or bleeding that cannot stop. You have any signs of a stroke. "BE FAST" is an easy way to remember the main warning signs: B - Balance. Signs are dizziness, sudden trouble walking, or loss of balance. E - Eyes. Signs are trouble seeing or a change in how you see. F - Face. Signs are sudden weakness or loss of feeling in the face, or the face or eyelid drooping on one side. A - Arms. Signs are weakness or loss of feeling in an arm. This happens suddenly and usually on one side of the body. S - Speech. Signs are sudden trouble speaking, slurred speech, or trouble understanding what people say. T - Time. Time to call emergency services. Write down what time symptoms started. You have other signs of a stroke, such as: A sudden, very bad headache with no known cause. Feeling like you may vomit  (nausea). Vomiting. A seizure. These symptoms may be an emergency. Do not wait to see if the symptoms will go away. Get medical help right away. Call your local emergency services (911 in the [place] Do not drive yourself to the hospital. Summary Atrial fibrillation is a type of heartbeat that is irregular or fast. You are at higher risk of this condition if you smoke, are older, have diabetes, or are overweight. Follow your doctor's instructions about medicines, diet, exercise, and follow-up visits. Get  help right away if you have signs or symptoms of a stroke. Get help right away if you cannot catch your breath, or you have chest pain or discomfort. This information is not intended to replace advice given to you by your health care provider. Make sure you discuss any questions you have with your healthcare provider. Document Revised: 01/05/2019 Document Reviewed: 01/05/2019 Elsevier Patient Education  2022 Elsevier Inc.   Edwina BarthMiguel Boysie Bonebrake, MD [facility]

## 2021-02-04 NOTE — Assessment & Plan Note (Signed)
Persistent chronic atrial fibrillation.  On both rate and rhythm control medications. Continue Tenoretic and Tambocor 50 mg twice a day.

## 2021-02-04 NOTE — Assessment & Plan Note (Signed)
Fall precautions given.  Continue Xarelto 20 mg daily.

## 2021-02-07 NOTE — Addendum Note (Signed)
Addended by: Evie Lacks on: 02/07/2021 10:15 AM   Modules accepted: Orders

## 2021-02-08 ENCOUNTER — Other Ambulatory Visit (INDEPENDENT_AMBULATORY_CARE_PROVIDER_SITE_OTHER): Payer: 59

## 2021-02-08 ENCOUNTER — Other Ambulatory Visit: Payer: Self-pay

## 2021-02-08 DIAGNOSIS — I10 Essential (primary) hypertension: Secondary | ICD-10-CM | POA: Diagnosis not present

## 2021-02-08 DIAGNOSIS — I4811 Longstanding persistent atrial fibrillation: Secondary | ICD-10-CM

## 2021-02-08 LAB — COMPREHENSIVE METABOLIC PANEL
ALT: 28 U/L (ref 0–35)
AST: 29 U/L (ref 0–37)
Albumin: 4.3 g/dL (ref 3.5–5.2)
Alkaline Phosphatase: 55 U/L (ref 39–117)
BUN: 18 mg/dL (ref 6–23)
CO2: 29 mEq/L (ref 19–32)
Calcium: 9.4 mg/dL (ref 8.4–10.5)
Chloride: 101 mEq/L (ref 96–112)
Creatinine, Ser: 0.96 mg/dL (ref 0.40–1.20)
GFR: 62.5 mL/min (ref >60.00)
Glucose, Bld: 91 mg/dL (ref 70–99)
Potassium: 3.7 mEq/L (ref 3.5–5.1)
Sodium: 139 mEq/L (ref 135–145)
Total Bilirubin: 0.7 mg/dL (ref 0.2–1.2)
Total Protein: 7.7 g/dL (ref 6.0–8.3)

## 2021-02-08 LAB — CBC
HCT: 35.8 % — ABNORMAL LOW (ref 36.0–46.0)
Hemoglobin: 11.8 g/dL — ABNORMAL LOW (ref 12.0–15.0)
MCHC: 33 g/dL (ref 30.0–36.0)
MCV: 78 fl (ref 78.0–100.0)
Platelets: 212 10*3/uL (ref 150.0–400.0)
RBC: 4.59 Mil/uL (ref 3.87–5.11)
RDW: 14.4 % (ref 11.5–15.5)
WBC: 5.7 10*3/uL (ref 4.0–10.5)

## 2021-02-08 LAB — LIPID PANEL
Cholesterol: 193 mg/dL (ref 0–200)
HDL: 67.9 mg/dL (ref >39.00)
LDL Cholesterol: 110 mg/dL — ABNORMAL HIGH (ref 0–99)
NonHDL: 124.85
Total CHOL/HDL Ratio: 3
Triglycerides: 72 mg/dL (ref 0.0–149.0)
VLDL: 14.4 mg/dL (ref 0.0–40.0)

## 2021-02-08 LAB — TSH: TSH: 1.25 u[IU]/mL (ref 0.35–5.50)

## 2021-02-08 LAB — HEMOGLOBIN A1C: Hgb A1c MFr Bld: 5.8 % (ref 4.6–6.5)

## 2021-06-12 NOTE — Progress Notes (Signed)
Established Patient Office Visit  Subjective:  Patient ID: Mandy Price, female    DOB: 06/10/1956  Age: 65 y.o. MRN: [id number]  CC:  Chief Complaint  Patient presents with   Medication Refill    Patient states she is here for a medication refill and pharmacy change.    HPI Mandy Price presents for med refill  Afib and htn Taking flecainide 50mg  po bid, tenoretic 100-25mg  po qd, and xarelto 20mg  po qd Good effect, no AE. No notable palpitations since last visit. Denies further CV symptoms including chest pain, headache, lightheadedness, LOC Vitals:   09/26/20 1647  BP: 128/77   No further concerns.    Past Medical History:  Diagnosis Date   A-fib [facility])    Hypertension     Past Surgical History:  Procedure Laterality Date   None Surgeries      No family history on file.  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     atenolol-chlorthalidone (TENORETIC) 100-25 MG tablet Take 1 tablet by mouth daily. 90 tablet 3   flecainide (TAMBOCOR) 50 MG tablet Take 1 tablet (50  mg total) by mouth 2 (two) times daily. 180 tablet 2   rivaroxaban (XARELTO) 20 MG TABS tablet TAKE 1 TABLET DAILY WITH SUPPER 90 tablet 2   cyclobenzaprine (FLEXERIL) 5 MG tablet Take 1 tablet (5 mg total) by mouth 3 (three) times daily as needed for muscle spasms. (Patient not taking: No sig reported) 90 tablet 0   No facility-administered medications prior to visit.    No Known Allergies  ROS Review of Systems  Constitutional: Negative.   HENT: Negative.    Eyes: Negative.   Respiratory: Negative.    Cardiovascular: Negative.   Gastrointestinal: Negative.   Genitourinary: Negative.   Musculoskeletal: Negative.   Skin: Negative.   Neurological: Negative.   Psychiatric/Behavioral: Negative.    All other systems reviewed and are negative.    Objective:    Physical Exam Vitals and nursing note reviewed.  Constitutional:      General: She is not in acute distress.    Appearance: Normal appearance. She is normal weight. She is not ill-appearing, toxic-appearing or diaphoretic.  Cardiovascular:     Rate and Rhythm: Normal rate and regular rhythm.     Heart sounds: Normal heart sounds. No murmur heard.   No friction rub. No gallop.  Pulmonary:     Effort:  Pulmonary effort is normal. No respiratory distress.     Breath sounds: Normal breath sounds. No stridor. No wheezing, rhonchi or rales.  Chest:     Chest wall: No tenderness.  Skin:    General: Skin is warm and dry.  Neurological:     General: No focal deficit present.     Mental Status: She is alert and oriented to person, place, and time. Mental status is at baseline.  Psychiatric:        Mood and Affect: Mood normal.        Behavior: Behavior normal.        Thought Content: Thought content normal.        Judgment: Judgment normal.    BP 128/77   Pulse 95   Temp 98 F (36.7 C) (Temporal)   Resp 18   Ht 5\' 5"  (1.651 m)   Wt 185 lb 3.2 oz (84 kg)   SpO2 99%   BMI 30.82 kg/m  Wt Readings from Last 3  Encounters:  02/04/21 189 lb (85.7 kg)  10/19/20 185 lb 3.2 oz (84 kg)  09/26/20 185 lb 3.2 oz (84 kg)     Health Maintenance Due  Topic Date Due   Zoster Vaccines- Shingrix (1 of 2) Never done   COVID-19 Vaccine (3 - Pfizer risk series) 11/29/2019   INFLUENZA VACCINE  02/25/2021   PAP SMEAR-Modifier  07/06/2021    There are no preventive care reminders to display for this patient.  Lab Results  Component Value Date   TSH 1.25 02/08/2021   Lab Results  Component Value Date   WBC 5.7 02/08/2021   HGB 11.8 (L) 02/08/2021   HCT 35.8 (L) 02/08/2021   MCV 78.0 02/08/2021   PLT 212.0 02/08/2021   Lab Results  Component Value Date   NA 139 02/08/2021   K 3.7 02/08/2021   CO2 29 02/08/2021   GLUCOSE 91 02/08/2021   BUN 18 02/08/2021   CREATININE 0.96 02/08/2021   BILITOT 0.7 02/08/2021   ALKPHOS 55 02/08/2021   AST 29 02/08/2021   ALT 28 02/08/2021   PROT 7.7 02/08/2021   ALBUMIN 4.3 02/08/2021   CALCIUM 9.4 02/08/2021   GFR 62.50 02/08/2021   Lab Results  Component Value Date   CHOL 193 02/08/2021   Lab Results  Component Value Date   HDL 67.90 02/08/2021   Lab Results  Component Value Date   LDLCALC 110 (H) 02/08/2021   Lab Results  Component Value Date   TRIG 72.0 02/08/2021   Lab Results  Component Value Date   CHOLHDL 3 02/08/2021   Lab Results  Component Value Date   HGBA1C 5.8 02/08/2021      Assessment & Plan:   Problem List Items Addressed This Visit       Cardiovascular and Mediastinum   Essential hypertension   Atrial fibrillation (HCC)     Other   Current use of long term anticoagulation - Primary    No orders of the defined types were placed in this encounter.   Follow-up: No follow-ups on file.   PLAN Refills x 3 mo Conditions are stable Return for in ov, labs Recommend annual CPE Patient encouraged to call clinic with any questions, comments, or concerns.  02/10/2021, NP

## 2021-07-03 ENCOUNTER — Ambulatory Visit: Payer: Commercial Managed Care - PPO | Admitting: Student

## 2021-07-08 ENCOUNTER — Ambulatory Visit: Payer: Medicare Other | Admitting: Student

## 2021-07-08 ENCOUNTER — Other Ambulatory Visit: Payer: Self-pay

## 2021-07-08 ENCOUNTER — Encounter: Payer: Self-pay | Admitting: Student

## 2021-07-08 VITALS — BP 135/78 | HR 77 | Temp 97.8°F | Ht 65.0 in | Wt 183.6 lb

## 2021-07-08 DIAGNOSIS — I10 Essential (primary) hypertension: Secondary | ICD-10-CM | POA: Diagnosis not present

## 2021-07-08 DIAGNOSIS — E7800 Pure hypercholesterolemia, unspecified: Secondary | ICD-10-CM

## 2021-07-08 DIAGNOSIS — I480 Paroxysmal atrial fibrillation: Secondary | ICD-10-CM

## 2021-07-08 MED ORDER — ATORVASTATIN CALCIUM 20 MG PO TABS
20.0000 mg | ORAL_TABLET | Freq: Every evening | ORAL | 3 refills | Status: DC
Start: 2021-07-08 — End: 2021-08-26

## 2021-07-08 NOTE — Progress Notes (Signed)
Primary Physician/Referring:  Georgina Quint, MD  Patient ID: Mandy Price, female    DOB: [birth year], 65 y.o.   MRN: [id number]  Chief Complaint  Patient presents with   Atrial Fibrillation        Hypertension   Follow-up   HPI:    Mandy Price  is a 65 y.o. female with hypertension, atrial fibrillation. Lexiscan nuclear stress testing in July 2020 that was considered low risk study.  Echocardiogram was also performed in August 2020 that revealed normal LVEF.  Patient preferred antiarrythmic therapy to cardioversion, therefore is presently on low dose flecainide.   Patient presents for annual visit with paroxysmal atrial fibrillation, hypertension, hyperlipidemia.  Patient has had no known recurrence of atrial fibrillation.  Denies dyspnea, chest pain, palpitations, syncope, near syncope.  Denies orthopnea, PND, dizziness, leg swelling.  She continues to tolerate anticoagulation without bleeding diathesis.  Personally reviewed external labs, LDL remains elevated at 110, A1c improved to 5.8%.  Past Medical History:  Diagnosis Date   A-fib [facility])    Hypertension    Past Surgical History:  Procedure Laterality Date   None Surgeries     History reviewed. No pertinent family history.  Social History   Tobacco Use   Smoking status: Never   Smokeless tobacco: Never  Substance Use Topics   Alcohol use: No   Marital Status: Married   ROS  Review of Systems  Cardiovascular:  Negative for chest pain, claudication, dyspnea on exertion, leg swelling, near-syncope, orthopnea, palpitations, paroxysmal nocturnal dyspnea and syncope.  Respiratory:  Negative for shortness of breath.    Objective  Blood pressure 135/78, pulse 77, temperature 97.8 F (36.6 C), temperature source Temporal, height 5\' 5"  (1.651 m), weight 183 lb 9.6 oz (83.3 kg), SpO2 99 %.  Vitals with BMI 07/08/2021 02/04/2021 10/19/2020  Height 5\' 5"  5\' 5"  5\' 5"   Weight 183 lbs 10 oz 189 lbs 185 lbs 3 oz   BMI 30.55 31.45 30.82  Systolic 135 132 10/21/2020  Diastolic 78 82 81  Pulse 77 100 75      Physical Exam Vitals reviewed.  Constitutional:      Appearance: She is obese.  Cardiovascular:     Rate and Rhythm: Normal rate and regular rhythm.     Pulses: Intact distal pulses.     Heart sounds: S1 normal and S2 normal. No murmur heard.   No gallop.  Pulmonary:     Effort: Pulmonary effort is normal. No respiratory distress.     Breath sounds: No wheezing, rhonchi or rales.  Musculoskeletal:     Right lower leg: No edema.     Left lower leg: No edema.  Neurological:     Mental Status: She is alert.    Laboratory examination:   Recent Labs    02/08/21 0805  NA 139  K 3.7  CL 101  CO2 29  GLUCOSE 91  BUN 18  CREATININE 0.96  CALCIUM 9.4   CrCl cannot be calculated (Patient's most recent lab result is older than the maximum 21 days allowed.).  CMP Latest Ref Rng & Units 02/08/2021 06/18/2020 10/17/2019  Glucose 70 - 99 mg/dL 91 94 89  BUN 6 - 23 mg/dL 18 21 13   Creatinine 0.40 - 1.20 mg/dL 02/10/21 02/10/2021 06/20/2020  Sodium 135 - 145 mEq/L 139 140 144  Potassium 3.5 - 5.1 mEq/L 3.7 3.4(L) 4.2  Chloride 96 - 112 mEq/L 101 101 103  CO2 19 - 32 mEq/L 29 26 23  Calcium 8.4 - 10.5 mg/dL 9.4 9.5 22.9  Total Protein 6.0 - 8.3 g/dL 7.7 7.1 7.8  Total Bilirubin 0.2 - 1.2 mg/dL 0.7 0.2 0.4  Alkaline Phos 39 - 117 U/L 55 67 71  AST 0 - 37 U/L 29 38 28  ALT 0 - 35 U/L 28 33(H) 30   CBC Latest Ref Rng & Units 02/08/2021 06/18/2020 10/17/2019  WBC 4.0 - 10.5 K/uL 5.7 7.3 5.6  Hemoglobin 12.0 - 15.0 g/dL 11.8(L) 11.7 13.3  Hematocrit 36.0 - 46.0 % 35.8(L) 37.2 41.9  Platelets 150.0 - 400.0 K/uL 212.0 206 254    Lipid Panel Recent Labs    02/08/21 0805  CHOL 193  TRIG 72.0  LDLCALC 110*  VLDL 14.4  HDL 67.90  CHOLHDL 3    HEMOGLOBIN A1C Lab Results  Component Value Date   HGBA1C 5.8 02/08/2021   TSH Recent Labs    02/08/21 0805  TSH 1.25   External labs:  None   Allergies   No Known Allergies   Medications Prior to Visit:   Outpatient Medications Prior to Visit  Medication Sig Dispense Refill   atenolol-chlorthalidone (TENORETIC) 100-25 MG tablet Take 1 tablet by mouth daily. 90 tablet 3   flecainide (TAMBOCOR) 50 MG tablet Take 1 tablet (50 mg total) by mouth 2 (two) times daily. 180 tablet 2   OMEGA-3 KRILL OIL PO Take by mouth daily.     rivaroxaban (XARELTO) 20 MG TABS tablet TAKE 1 TABLET DAILY WITH SUPPER 90 tablet 2   No facility-administered medications prior to visit.   Final Medications at End of Visit    Current Meds  Medication Sig   atenolol-chlorthalidone (TENORETIC) 100-25 MG tablet Take 1 tablet by mouth daily.   atorvastatin (LIPITOR) 20 MG tablet Take 1 tablet (20 mg total) by mouth at bedtime.   flecainide (TAMBOCOR) 50 MG tablet Take 1 tablet (50 mg total) by mouth 2 (two) times daily.   OMEGA-3 KRILL OIL PO Take by mouth daily.   rivaroxaban (XARELTO) 20 MG TABS tablet TAKE 1 TABLET DAILY WITH SUPPER   Radiology:   No results found.  Cardiac Studies:   Echocardiogram 03/25/2019: Left ventricle cavity is normal in size. Normal left ventricular wall thickness. Normal LV systolic function with EF 55%. Normal global wall motion. Unable to evaluate diastolic function due to atrial fibrillation. Calculated EF 55%. Left atrial cavity is mildly dilated. Mild (Grade I) mitral regurgitation. Mild to moderate tricuspid regurgitation. Estimated pulmonary artery systolic pressure is 26 mmHg. Compared to previous study in 2018, PA pressures have improved.    Lexiscan Sestamibi stress test 02/18/2019: Lexiscan stress test was performed. Stress EKG is non-diagnostic, as this is pharmacological stress test. Myocardial perfusion imaging is normal. LVEF 58%. Low risk study.  EKG:  07/08/2021: Sinus rhythm at a rate of 76 bpm.  Normal axis. IRBBB. Diffuse nonspecific T wave abnormality, slightly more prominent than EKG 07/03/2020.   EKG  07/03/2020: Normal sinus rhythm at a rate of 69 bpm, left atrial enlargement.  Normal axis. Incomplete RBBB. Diffuse nonspecific T wave abnormality.  Compared to EKG 05/04/2019, no significant change.   Assessment     ICD-10-CM   1. Paroxysmal atrial fibrillation (HCC)  I48.0 EKG 12-Lead    2. Essential hypertension  I10     3. Hypercholesterolemia  E78.00        There are no discontinued medications.  Meds ordered this encounter  Medications   atorvastatin (LIPITOR) 20 MG tablet  Sig: Take 1 tablet (20 mg total) by mouth at bedtime.    Dispense:  90 tablet    Refill:  3   CHA2DS2-VASc Score is 2 with yearly risk of stroke of 2.2 %. HAS-Bled score is 0 and estimated major bleeding in one year is 0.6-1.13 %  Recommendations:   Jaidalyn Holik is a 65 y.o. female with hypertension, atrial fibrillation. Lexiscan nuclear stress testing in July 2020 that was considered low risk study.  Echocardiogram was also performed in August 2020 that revealed normal LVEF.  Patient preferred antiarrythmic therapy to cardioversion, therefore is presently on low dose flecainide.   Patient presents for annual visit with paroxysmal atrial fibrillation, hypertension, hyperlipidemia.  She has had no known recurrence of A. fib since starting flecainide.  She continues to tolerate anticoagulation without bleeding diathesis.  Patient's blood pressure is well controlled.  In regard to hyperlipidemia, patient's blood pressure remains elevated at 110.  Patient's estimated 10-year ASCVD risk is 9%, therefore recommend initiation of statin therapy, patient is agreeable.  We will start atorvastatin 20 mg daily.  Will defer repeat lipid profile testing to PCP at upcoming appointment.  Follow-up in 1 year, sooner if needed, for paroxysmal atrial fibrillation, hypertension, hyperlipidemia.   Alethia Berthold, PA-C 07/08/2021, 9:18 AM Office: [phone]

## 2021-07-30 ENCOUNTER — Other Ambulatory Visit: Payer: Self-pay | Admitting: Emergency Medicine

## 2021-07-30 DIAGNOSIS — Z1231 Encounter for screening mammogram for malignant neoplasm of breast: Secondary | ICD-10-CM

## 2021-08-05 DIAGNOSIS — H524 Presbyopia: Secondary | ICD-10-CM | POA: Diagnosis not present

## 2021-08-05 DIAGNOSIS — H5203 Hypermetropia, bilateral: Secondary | ICD-10-CM | POA: Diagnosis not present

## 2021-08-05 DIAGNOSIS — H2513 Age-related nuclear cataract, bilateral: Secondary | ICD-10-CM | POA: Diagnosis not present

## 2021-08-12 ENCOUNTER — Ambulatory Visit: Payer: 59 | Admitting: Emergency Medicine

## 2021-08-16 ENCOUNTER — Ambulatory Visit
Admission: RE | Admit: 2021-08-16 | Discharge: 2021-08-16 | Disposition: A | Discharge status: Home / Self Care | Payer: Medicare Other | Source: Ambulatory Visit | Attending: Emergency Medicine | Admitting: Emergency Medicine

## 2021-08-16 DIAGNOSIS — Z1231 Encounter for screening mammogram for malignant neoplasm of breast: Secondary | ICD-10-CM | POA: Diagnosis not present

## 2021-08-26 ENCOUNTER — Encounter: Payer: Self-pay | Admitting: Emergency Medicine

## 2021-08-26 ENCOUNTER — Other Ambulatory Visit: Payer: Self-pay | Admitting: Emergency Medicine

## 2021-08-26 ENCOUNTER — Other Ambulatory Visit: Payer: Self-pay

## 2021-08-26 ENCOUNTER — Ambulatory Visit (INDEPENDENT_AMBULATORY_CARE_PROVIDER_SITE_OTHER): Payer: Medicare Other | Admitting: Emergency Medicine

## 2021-08-26 VITALS — BP 122/72 | HR 76 | Ht 65.0 in | Wt 182.0 lb

## 2021-08-26 DIAGNOSIS — R7303 Prediabetes: Secondary | ICD-10-CM | POA: Diagnosis not present

## 2021-08-26 DIAGNOSIS — D6859 Other primary thrombophilia: Secondary | ICD-10-CM

## 2021-08-26 DIAGNOSIS — Z7901 Long term (current) use of anticoagulants: Secondary | ICD-10-CM | POA: Diagnosis not present

## 2021-08-26 DIAGNOSIS — E785 Hyperlipidemia, unspecified: Secondary | ICD-10-CM

## 2021-08-26 DIAGNOSIS — I4811 Longstanding persistent atrial fibrillation: Secondary | ICD-10-CM | POA: Diagnosis not present

## 2021-08-26 DIAGNOSIS — I10 Essential (primary) hypertension: Secondary | ICD-10-CM

## 2021-08-26 LAB — CBC WITH DIFFERENTIAL/PLATELET
Basophils Absolute: 0.1 10*3/uL (ref 0.0–0.1)
Basophils Relative: 0.9 % (ref 0.0–3.0)
Eosinophils Absolute: 0.2 10*3/uL (ref 0.0–0.7)
Eosinophils Relative: 2.7 % (ref 0.0–5.0)
HCT: 37.7 % (ref 36.0–46.0)
Hemoglobin: 12 g/dL (ref 12.0–15.0)
Lymphocytes Relative: 38.5 % (ref 12.0–46.0)
Lymphs Abs: 2.3 10*3/uL (ref 0.7–4.0)
MCHC: 31.8 g/dL (ref 30.0–36.0)
MCV: 78.4 fl (ref 78.0–100.0)
Monocytes Absolute: 0.5 10*3/uL (ref 0.1–1.0)
Monocytes Relative: 9 % (ref 3.0–12.0)
Neutro Abs: 2.9 10*3/uL (ref 1.4–7.7)
Neutrophils Relative %: 48.9 % (ref 43.0–77.0)
Platelets: 254 10*3/uL (ref 150.0–400.0)
RBC: 4.8 Mil/uL (ref 3.87–5.11)
RDW: 13.4 % (ref 11.5–15.5)
WBC: 6 10*3/uL (ref 4.0–10.5)

## 2021-08-26 LAB — COMPREHENSIVE METABOLIC PANEL
ALT: 28 U/L (ref 0–35)
AST: 22 U/L (ref 0–37)
Albumin: 4.3 g/dL (ref 3.5–5.2)
Alkaline Phosphatase: 55 U/L (ref 39–117)
BUN: 15 mg/dL (ref 6–23)
CO2: 30 mEq/L (ref 19–32)
Calcium: 9.6 mg/dL (ref 8.4–10.5)
Chloride: 99 mEq/L (ref 96–112)
Creatinine, Ser: 0.92 mg/dL (ref 0.40–1.20)
GFR: 65.52 mL/min (ref >60.00)
Glucose, Bld: 87 mg/dL (ref 70–99)
Potassium: 3.3 mEq/L — ABNORMAL LOW (ref 3.5–5.1)
Sodium: 137 mEq/L (ref 135–145)
Total Bilirubin: 0.5 mg/dL (ref 0.2–1.2)
Total Protein: 7.8 g/dL (ref 6.0–8.3)

## 2021-08-26 LAB — LIPID PANEL
Cholesterol: 184 mg/dL (ref 0–200)
HDL: 64.4 mg/dL (ref >39.00)
LDL Cholesterol: 92 mg/dL (ref 0–99)
NonHDL: 119.35
Total CHOL/HDL Ratio: 3
Triglycerides: 135 mg/dL (ref 0.0–149.0)
VLDL: 27 mg/dL (ref 0.0–40.0)

## 2021-08-26 LAB — HEMOGLOBIN A1C: Hgb A1c MFr Bld: 5.7 % (ref 4.6–6.5)

## 2021-08-26 MED ORDER — ROSUVASTATIN CALCIUM 10 MG PO TABS: 10.0000 mg | ORAL_TABLET | Freq: Every day | ORAL | 3 refills | Status: DC

## 2021-08-26 NOTE — Assessment & Plan Note (Signed)
Stable.  Diet and nutrition discussed.  Lipid profile done today. Start rosuvastatin 10 mg daily. The 10-year ASCVD risk score (Arnett DK, et al., 2019) is: 7.5%   Values used to calculate the score:     Age: 66 years     Sex: Female     Is Non-Hispanic African American: Yes     Diabetic: No     Tobacco smoker: No     Systolic Blood Pressure: 123XX123 mmHg     Is BP treated: Yes     HDL Cholesterol: 67.9 mg/dL     Total Cholesterol: 193 mg/dL

## 2021-08-26 NOTE — Assessment & Plan Note (Signed)
Continue Xarelto 20 mg daily.  No recent bleeding episodes. Fall precautions discussed.

## 2021-08-26 NOTE — Progress Notes (Signed)
Mandy Price 66 y.o.   Chief Complaint  Patient presents with   Hypertension    F/U, chronic condtitons    HISTORY OF PRESENT ILLNESS: This is a 66 y.o. female with history of hypertension, chronic A. fib here for follow-up. Doing well.  Has no complaints or medical concerns today History of dyslipidemia but not taking atorvastatin Has the following chronic medical problems: 1.  Hypertension: On Tenoretic 100-25 mg daily 2.  Chronic atrial fibrillation: On Tambocor 50 mg twice a day 3.  Long-term anticoagulation on Xarelto 20 mg daily.     HPI   Prior to Admission medications   Medication Sig Start Date End Date Taking? Authorizing Provider  atenolol-chlorthalidone (TENORETIC) 100-25 MG tablet Take 1 tablet by mouth daily. 10/19/20  Yes Maximiano Coss, NP  flecainide (TAMBOCOR) 50 MG tablet Take 1 tablet (50 mg total) by mouth 2 (two) times daily. 10/19/20  Yes Maximiano Coss, NP  OMEGA-3 KRILL OIL PO Take by mouth daily.   Yes [provider]  rivaroxaban (XARELTO) 20 MG TABS tablet TAKE 1 TABLET DAILY WITH SUPPER 10/19/20  Yes Maximiano Coss, NP  atorvastatin (LIPITOR) 20 MG tablet Take 1 tablet (20 mg total) by mouth at bedtime. Patient not taking: Reported on 08/26/2021 07/08/21 07/03/22  Lawerance Cruel C, PA-C    No Known Allergies  Patient Active Problem List   Diagnosis Date Noted   Current use of long term anticoagulation 02/04/2021   Thrombophilia ([place]) 02/04/2021   Rapid heartbeat 02/10/2019   Atrial flutter ([place]) 02/10/2019   Atrial fibrillation ([place]) 03/09/2017   Back pain, thoracic 04/28/2012   Obesity (BMI 30.0-34.9) 04/28/2012   Essential hypertension 01/22/2012    Past Medical History:  Diagnosis Date   A-fib ([place])    Hypertension     Past Surgical History:  Procedure Laterality Date   None Surgeries      Social History   Socioeconomic History   Marital status: Married    Spouse name: Not on file   Number of children: 4    Years of education: Not on file   Highest education level: Not on file  Occupational History   Not on file  Tobacco Use   Smoking status: Never   Smokeless tobacco: Never  Vaping Use   Vaping Use: Never used  Substance and Sexual Activity   Alcohol use: No   Drug use: No   Sexual activity: Yes    Partners: Male  Other Topics Concern   Not on file  Social History Narrative   Married   4 grown children   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   Review of Systems  Constitutional: Negative.  Negative for chills and fever.  HENT: Negative.  Negative for congestion and sore throat.   Respiratory: Negative.  Negative for cough and shortness of breath.   Cardiovascular: Negative.  Negative for chest pain and palpitations.  Gastrointestinal: Negative.  Negative for abdominal pain, diarrhea, nausea and vomiting.  Genitourinary: Negative.  Negative for dysuria and hematuria.  Skin: Negative.  Negative for rash.  Neurological: Negative.  Negative for dizziness and headaches.  All other systems reviewed and are negative.  Today's Vitals  08/26/21 1024  BP: 122/72  Pulse: 76  SpO2: 96%  Weight: 182 lb (82.6 kg)  Height: 5\' 5"  (1.651 m)   Body mass index is 30.29 kg/m. Wt Readings from Last 3 Encounters:  08/26/21 182 lb (82.6 kg)  07/08/21 183 lb 9.6 oz (83.3 kg)  02/04/21 189 lb (85.7 kg)    Physical Exam Vitals reviewed.  Constitutional:      Appearance: Normal appearance.  HENT:     Head: Normocephalic.  Eyes:     Extraocular Movements: Extraocular movements intact.     Conjunctiva/sclera: Conjunctivae normal.     Pupils: Pupils are equal, round, and reactive to light.  Cardiovascular:      Rate and Rhythm: Normal rate and regular rhythm.     Pulses: Normal pulses.     Heart sounds: Normal heart sounds.     Comments: No atrial fibrillation on clinical exam Pulmonary:     Effort: Pulmonary effort is normal.     Breath sounds: Normal breath sounds.  Musculoskeletal:        General: Normal range of motion.     Cervical back: Normal range of motion and neck supple.     Right lower leg: No edema.     Left lower leg: No edema.  Skin:    General: Skin is warm and dry.  Neurological:     General: No focal deficit present.     Mental Status: She is alert and oriented to person, place, and time.  Psychiatric:        Mood and Affect: Mood normal.        Behavior: Behavior normal.    The 10-year ASCVD risk score (Arnett DK, et al., 2019) is: 7.5%   Values used to calculate the score:     Age: 31 years     Sex: Female     Is Non-Hispanic African American: Yes     Diabetic: No     Tobacco smoker: No     Systolic Blood Pressure: 123XX123 mmHg     Is BP treated: Yes     HDL Cholesterol: 67.9 mg/dL     Total Cholesterol: 193 mg/dL  ASSESSMENT & PLAN: Problem List Items Addressed This Visit       Cardiovascular and Mediastinum   Essential hypertension - Primary    Well-controlled hypertension.  Continue Tenoretic 100-25 mg daily Dietary approaches to stop hypertension discussed. BP Readings from Last 3 Encounters:  08/26/21 122/72  07/08/21 135/78  02/04/21 132/82         Relevant Medications   rosuvastatin (CRESTOR) 10 MG tablet   Other Relevant Orders   CBC with Differential/Platelet   Comprehensive metabolic panel   Lipid panel   Atrial fibrillation (HCC)    Controlled rate and rhythm. Continue flecainide 50 mg twice a day Continue Tenoretic 100-25 mg daily.      Relevant Medications   rosuvastatin (CRESTOR) 10 MG tablet   Other Relevant Orders   CBC with Differential/Platelet     Hematopoietic and Hemostatic   Thrombophilia (HCC)    Continue Xarelto 20  mg daily.  No recent bleeding episodes. Fall precautions discussed.        Other   Current use of long term anticoagulation   Relevant Orders   CBC with Differential/Platelet   Dyslipidemia    Stable.  Diet and nutrition discussed.  Lipid profile done today. Start rosuvastatin 10 mg daily. The 10-year ASCVD risk score (Arnett DK, et al., 2019) is: 7.5%  Values used to calculate the score:     Age: 25 years     Sex: Female     Is Non-Hispanic African American: Yes     Diabetic: No     Tobacco smoker: No     Systolic Blood Pressure: 123XX123 mmHg     Is BP treated: Yes     HDL Cholesterol: 67.9 mg/dL     Total Cholesterol: 193 mg/dL       Relevant Medications   rosuvastatin (CRESTOR) 10 MG tablet   Other Relevant Orders   Lipid panel   Prediabetes    Diet and nutrition discussed. hemoglobin A1c done today. Advised to decrease amount of daily carbohydrate intake.      Relevant Orders   Hemoglobin A1c   Patient Instructions  Health Maintenance After Age 50 After age 22, you are at a higher risk for certain long-term diseases and infections as well as injuries from falls. Falls are a major cause of broken bones and head injuries in people who are older than age 67. Getting regular preventive care can help to keep you healthy and well. Preventive care includes getting regular testing and making lifestyle changes as recommended by your health care provider. Talk with your health care provider about: Which screenings and tests you should have. A screening is a test that checks for a disease when you have no symptoms. A diet and exercise plan that is right for you. What should I know about screenings and tests to prevent falls? Screening and testing are the best ways to find a health problem early. Early diagnosis and treatment give you the best chance of managing medical conditions that are common after age 25. Certain conditions and lifestyle choices may make you more likely to have  a fall. Your health care provider may recommend: Regular vision checks. Poor vision and conditions such as cataracts can make you more likely to have a fall. If you wear glasses, make sure to get your prescription updated if your vision changes. Medicine review. Work with your health care provider to regularly review all of the medicines you are taking, including over-the-counter medicines. Ask your health care provider about any side effects that may make you more likely to have a fall. Tell your health care provider if any medicines that you take make you feel dizzy or sleepy. Strength and balance checks. Your health care provider may recommend certain tests to check your strength and balance while standing, walking, or changing positions. Foot health exam. Foot pain and numbness, as well as not wearing proper footwear, can make you more likely to have a fall. Screenings, including: Osteoporosis screening. Osteoporosis is a condition that causes the bones to get weaker and break more easily. Blood pressure screening. Blood pressure changes and medicines to control blood pressure can make you feel dizzy. Depression screening. You may be more likely to have a fall if you have a fear of falling, feel depressed, or feel unable to do activities that you used to do. Alcohol use screening. Using too much alcohol can affect your balance and may make you more likely to have a fall. Follow these instructions at home: Lifestyle Do not drink alcohol if: Your health care provider tells you not to drink. If you drink alcohol: Limit how much you have to: 0-1 drink a day for women. 0-2 drinks a day for men. Know how much alcohol is in your drink. In the [place] one drink equals one 12 oz bottle of  beer (355 mL), one 5 oz glass of wine (148 mL), or one 1 oz glass of hard liquor (44 mL). Do not use any products that contain nicotine or tobacco. These products include cigarettes, chewing tobacco, and vaping  devices, such as e-cigarettes. If you need help quitting, ask your health care provider. Activity  Follow a regular exercise program to stay fit. This will help you maintain your balance. Ask your health care provider what types of exercise are appropriate for you. If you need a cane or walker, use it as recommended by your health care provider. Wear supportive shoes that have nonskid soles. Safety  Remove any tripping hazards, such as rugs, cords, and clutter. Install safety equipment such as grab bars in bathrooms and safety rails on stairs. Keep rooms and walkways well-lit. General instructions Talk with your health care provider about your risks for falling. Tell your health care provider if: You fall. Be sure to tell your health care provider about all falls, even ones that seem minor. You feel dizzy, tiredness (fatigue), or off-balance. Take over-the-counter and prescription medicines only as told by your health care provider. These include supplements. Eat a healthy diet and maintain a healthy weight. A healthy diet includes low-fat dairy products, low-fat (lean) meats, and fiber from whole grains, beans, and lots of fruits and vegetables. Stay current with your vaccines. Schedule regular health, dental, and eye exams. Summary Having a healthy lifestyle and getting preventive care can help to protect your health and wellness after age 87. Screening and testing are the best way to find a health problem early and help you avoid having a fall. Early diagnosis and treatment give you the best chance for managing medical conditions that are more common for people who are older than age 43. Falls are a major cause of broken bones and head injuries in people who are older than age 17. Take precautions to prevent a fall at home. Work with your health care provider to learn what changes you can make to improve your health and wellness and to prevent falls. This information is not intended to  replace advice given to you by your health care provider. Make sure you discuss any questions you have with your health care provider. Document Revised: 12/03/2020 Document Reviewed: 12/03/2020 Elsevier Patient Education  2022 Geraldine, MD [facility]

## 2021-08-26 NOTE — Assessment & Plan Note (Signed)
Controlled rate and rhythm. Continue flecainide 50 mg twice a day Continue Tenoretic 100-25 mg daily.

## 2021-08-26 NOTE — Patient Instructions (Signed)
Health Maintenance After Age 65 After age 65, you are at a higher risk for certain long-term diseases and infections as well as injuries from falls. Falls are a major cause of broken bones and head injuries in people who are older than age 65. Getting regular preventive care can help to keep you healthy and well. Preventive care includes getting regular testing and making lifestyle changes as recommended by your health care provider. Talk with your health care provider about: Which screenings and tests you should have. A screening is a test that checks for a disease when you have no symptoms. A diet and exercise plan that is right for you. What should I know about screenings and tests to prevent falls? Screening and testing are the best ways to find a health problem early. Early diagnosis and treatment give you the best chance of managing medical conditions that are common after age 65. Certain conditions and lifestyle choices may make you more likely to have a fall. Your health care provider may recommend: Regular vision checks. Poor vision and conditions such as cataracts can make you more likely to have a fall. If you wear glasses, make sure to get your prescription updated if your vision changes. Medicine review. Work with your health care provider to regularly review all of the medicines you are taking, including over-the-counter medicines. Ask your health care provider about any side effects that may make you more likely to have a fall. Tell your health care provider if any medicines that you take make you feel dizzy or sleepy. Strength and balance checks. Your health care provider may recommend certain tests to check your strength and balance while standing, walking, or changing positions. Foot health exam. Foot pain and numbness, as well as not wearing proper footwear, can make you more likely to have a fall. Screenings, including: Osteoporosis screening. Osteoporosis is a condition that causes  the bones to get weaker and break more easily. Blood pressure screening. Blood pressure changes and medicines to control blood pressure can make you feel dizzy. Depression screening. You may be more likely to have a fall if you have a fear of falling, feel depressed, or feel unable to do activities that you used to do. Alcohol use screening. Using too much alcohol can affect your balance and may make you more likely to have a fall. Follow these instructions at home: Lifestyle Do not drink alcohol if: Your health care provider tells you not to drink. If you drink alcohol: Limit how much you have to: 0-1 drink a day for women. 0-2 drinks a day for men. Know how much alcohol is in your drink. In the [place] one drink equals one 12 oz bottle of beer (355 mL), one 5 oz glass of wine (148 mL), or one 1 oz glass of hard liquor (44 mL). Do not use any products that contain nicotine or tobacco. These products include cigarettes, chewing tobacco, and vaping devices, such as e-cigarettes. If you need help quitting, ask your health care provider. Activity  Follow a regular exercise program to stay fit. This will help you maintain your balance. Ask your health care provider what types of exercise are appropriate for you. If you need a cane or walker, use it as recommended by your health care provider. Wear supportive shoes that have nonskid soles. Safety  Remove any tripping hazards, such as rugs, cords, and clutter. Install safety equipment such as grab bars in bathrooms and safety rails on stairs. Keep rooms and walkways   well-lit. General instructions Talk with your health care provider about your risks for falling. Tell your health care provider if: You fall. Be sure to tell your health care provider about all falls, even ones that seem minor. You feel dizzy, tiredness (fatigue), or off-balance. Take over-the-counter and prescription medicines only as told by your health care provider. These include  supplements. Eat a healthy diet and maintain a healthy weight. A healthy diet includes low-fat dairy products, low-fat (lean) meats, and fiber from whole grains, beans, and lots of fruits and vegetables. Stay current with your vaccines. Schedule regular health, dental, and eye exams. Summary Having a healthy lifestyle and getting preventive care can help to protect your health and wellness after age 65. Screening and testing are the best way to find a health problem early and help you avoid having a fall. Early diagnosis and treatment give you the best chance for managing medical conditions that are more common for people who are older than age 65. Falls are a major cause of broken bones and head injuries in people who are older than age 65. Take precautions to prevent a fall at home. Work with your health care provider to learn what changes you can make to improve your health and wellness and to prevent falls. This information is not intended to replace advice given to you by your health care provider. Make sure you discuss any questions you have with your health care provider. Document Revised: 12/03/2020 Document Reviewed: 12/03/2020 Elsevier Patient Education  2022 Elsevier Inc[employer]  

## 2021-08-26 NOTE — Assessment & Plan Note (Signed)
Well-controlled hypertension.  Continue Tenoretic 100-25 mg daily Dietary approaches to stop hypertension discussed. BP Readings from Last 3 Encounters:  08/26/21 122/72  07/08/21 135/78  02/04/21 132/82

## 2021-08-26 NOTE — Assessment & Plan Note (Signed)
Diet and nutrition discussed. hemoglobin A1c done today. Advised to decrease amount of daily carbohydrate intake.

## 2021-09-17 ENCOUNTER — Other Ambulatory Visit: Payer: Self-pay | Admitting: Registered Nurse

## 2021-09-17 DIAGNOSIS — I480 Paroxysmal atrial fibrillation: Secondary | ICD-10-CM

## 2021-09-20 ENCOUNTER — Telehealth: Payer: Self-pay

## 2021-09-20 NOTE — Telephone Encounter (Signed)
Pt is requesting a refill on: flecainide (TAMBOCOR) 50 MG tablet  Pharmacy: [place], [facility]  LOV:08/26/21  Pt CB [phone]

## 2021-09-21 ENCOUNTER — Other Ambulatory Visit: Payer: Self-pay | Admitting: Registered Nurse

## 2021-09-21 DIAGNOSIS — I480 Paroxysmal atrial fibrillation: Secondary | ICD-10-CM

## 2021-09-23 NOTE — Telephone Encounter (Signed)
Called patient to inform that her medication was sent to her pharmacy °

## 2021-12-21 ENCOUNTER — Other Ambulatory Visit: Payer: Self-pay | Admitting: Emergency Medicine

## 2021-12-21 DIAGNOSIS — I480 Paroxysmal atrial fibrillation: Secondary | ICD-10-CM

## 2021-12-23 ENCOUNTER — Other Ambulatory Visit: Payer: Self-pay | Admitting: Registered Nurse

## 2021-12-23 ENCOUNTER — Other Ambulatory Visit: Payer: Self-pay | Admitting: Emergency Medicine

## 2021-12-23 DIAGNOSIS — I480 Paroxysmal atrial fibrillation: Secondary | ICD-10-CM

## 2021-12-23 DIAGNOSIS — I10 Essential (primary) hypertension: Secondary | ICD-10-CM

## 2021-12-27 ENCOUNTER — Other Ambulatory Visit: Payer: Self-pay | Admitting: *Deleted

## 2021-12-27 DIAGNOSIS — I480 Paroxysmal atrial fibrillation: Secondary | ICD-10-CM

## 2021-12-27 MED ORDER — RIVAROXABAN 20 MG PO TABS: ORAL_TABLET | ORAL | 2 refills | Status: DC

## 2021-12-27 NOTE — Telephone Encounter (Signed)
Patient is requesting a refill of her Xarelto. Original rx was prescribed by another provider. Please advise if ok to fill

## 2021-12-30 ENCOUNTER — Other Ambulatory Visit: Payer: Self-pay | Admitting: *Deleted

## 2021-12-30 DIAGNOSIS — I10 Essential (primary) hypertension: Secondary | ICD-10-CM

## 2021-12-30 MED ORDER — ATENOLOL-CHLORTHALIDONE 100-25 MG PO TABS: 1.0000 | ORAL_TABLET | Freq: Every day | ORAL | 1 refills | Status: DC

## 2022-03-03 ENCOUNTER — Encounter: Payer: Self-pay | Admitting: Emergency Medicine

## 2022-03-03 ENCOUNTER — Ambulatory Visit (INDEPENDENT_AMBULATORY_CARE_PROVIDER_SITE_OTHER): Payer: Medicare Other | Admitting: Emergency Medicine

## 2022-03-03 VITALS — BP 130/70 | HR 74 | Temp 98.4°F | Ht 65.0 in | Wt 181.5 lb

## 2022-03-03 DIAGNOSIS — R7303 Prediabetes: Secondary | ICD-10-CM

## 2022-03-03 DIAGNOSIS — D6859 Other primary thrombophilia: Secondary | ICD-10-CM | POA: Diagnosis not present

## 2022-03-03 DIAGNOSIS — E785 Hyperlipidemia, unspecified: Secondary | ICD-10-CM | POA: Diagnosis not present

## 2022-03-03 DIAGNOSIS — Z7901 Long term (current) use of anticoagulants: Secondary | ICD-10-CM

## 2022-03-03 DIAGNOSIS — Z1211 Encounter for screening for malignant neoplasm of colon: Secondary | ICD-10-CM

## 2022-03-03 DIAGNOSIS — I10 Essential (primary) hypertension: Secondary | ICD-10-CM | POA: Diagnosis not present

## 2022-03-03 DIAGNOSIS — I4811 Longstanding persistent atrial fibrillation: Secondary | ICD-10-CM | POA: Diagnosis not present

## 2022-03-03 LAB — COMPREHENSIVE METABOLIC PANEL
ALT: 22 U/L (ref 0–35)
AST: 21 U/L (ref 0–37)
Albumin: 4.4 g/dL (ref 3.5–5.2)
Alkaline Phosphatase: 52 U/L (ref 39–117)
BUN: 18 mg/dL (ref 6–23)
CO2: 30 mEq/L (ref 19–32)
Calcium: 9.7 mg/dL (ref 8.4–10.5)
Chloride: 97 mEq/L (ref 96–112)
Creatinine, Ser: 1 mg/dL (ref 0.40–1.20)
GFR: 59.07 mL/min — ABNORMAL LOW (ref >60.00)
Glucose, Bld: 92 mg/dL (ref 70–99)
Potassium: 3.6 mEq/L (ref 3.5–5.1)
Sodium: 138 mEq/L (ref 135–145)
Total Bilirubin: 0.5 mg/dL (ref 0.2–1.2)
Total Protein: 8 g/dL (ref 6.0–8.3)

## 2022-03-03 LAB — CBC WITH DIFFERENTIAL/PLATELET
Basophils Absolute: 0.1 10*3/uL (ref 0.0–0.1)
Basophils Relative: 0.9 % (ref 0.0–3.0)
Eosinophils Absolute: 0.1 10*3/uL (ref 0.0–0.7)
Eosinophils Relative: 2.2 % (ref 0.0–5.0)
HCT: 36.8 % (ref 36.0–46.0)
Hemoglobin: 11.9 g/dL — ABNORMAL LOW (ref 12.0–15.0)
Lymphocytes Relative: 38.7 % (ref 12.0–46.0)
Lymphs Abs: 2.3 10*3/uL (ref 0.7–4.0)
MCHC: 32.3 g/dL (ref 30.0–36.0)
MCV: 77.9 fl — ABNORMAL LOW (ref 78.0–100.0)
Monocytes Absolute: 0.6 10*3/uL (ref 0.1–1.0)
Monocytes Relative: 9.2 % (ref 3.0–12.0)
Neutro Abs: 3 10*3/uL (ref 1.4–7.7)
Neutrophils Relative %: 49 % (ref 43.0–77.0)
Platelets: 229 10*3/uL (ref 150.0–400.0)
RBC: 4.72 Mil/uL (ref 3.87–5.11)
RDW: 13.9 % (ref 11.5–15.5)
WBC: 6.1 10*3/uL (ref 4.0–10.5)

## 2022-03-03 LAB — LIPID PANEL
Cholesterol: 196 mg/dL (ref 0–200)
HDL: 65.8 mg/dL (ref >39.00)
LDL Cholesterol: 108 mg/dL — ABNORMAL HIGH (ref 0–99)
NonHDL: 130.11
Total CHOL/HDL Ratio: 3
Triglycerides: 109 mg/dL (ref 0.0–149.0)
VLDL: 21.8 mg/dL (ref 0.0–40.0)

## 2022-03-03 LAB — HEMOGLOBIN A1C: Hgb A1c MFr Bld: 5.7 % (ref 4.6–6.5)

## 2022-03-03 NOTE — Assessment & Plan Note (Signed)
Stable.  Cardiovascular risks associated with dyslipidemia discussed. Diet and nutrition discussed. Continue rosuvastatin 10 mg daily. Lipid profile done today. The 10-year ASCVD risk score (Arnett DK, et al., 2019) is: 8.5%   Values used to calculate the score:     Age: 66 years     Sex: Female     Is Non-Hispanic African American: Yes     Diabetic: No     Tobacco smoker: No     Systolic Blood Pressure: 130 mmHg     Is BP treated: Yes     HDL Cholesterol: 64.4 mg/dL     Total Cholesterol: 184 mg/dL

## 2022-03-03 NOTE — Progress Notes (Signed)
Mandy Price 66 y.o.   Chief Complaint  Patient presents with   Follow-up    6 mnth f/u appt, trouble falling asleep    HISTORY OF PRESENT ILLNESS: This is a 66 y.o. female here for 10060-month follow-up of chronic medical problems. Doing well.  Trouble sleeping sometimes but not every night.  Still mourning the recent loss of her father. No other complaints or medical concerns today.  HPI   Prior to Admission medications   Medication Sig Start Date End Date Taking? Authorizing Provider  atenolol-chlorthalidone (TENORETIC) 100-25 MG tablet Take 1 tablet by mouth daily. 12/30/21  Yes Nora Rooke, Eilleen KempfMiguel Jose, MD  flecainide [facility](TAMBOCOR) 50 MG tablet Take 1 tablet by mouth twice daily 12/22/21  Yes Amelya Mabry, MorehouseMiguel Jose, MD  OMEGA-3 KRILL OIL PO Take by mouth daily.   Yes [provider]  rivaroxaban (XARELTO) 20 MG TABS tablet TAKE 1 TABLET DAILY WITH SUPPER 12/27/21  Yes Dennies Coate, Eilleen KempfMiguel Jose, MD  rosuvastatin (CRESTOR) 10 MG tablet Take 1 tablet (10 mg total) by mouth daily. 08/26/21  Yes Georgina QuintSagardia, Porfirio Bollier Jose, MD    No Known Allergies  Patient Active Problem List   Diagnosis Date Noted   Dyslipidemia 08/26/2021   Prediabetes 08/26/2021   Current use of long term anticoagulation 02/04/2021   Thrombophilia (HCC) 02/04/2021   Rapid heartbeat 02/10/2019   Atrial flutter (HCC) 02/10/2019   Atrial fibrillation (HCC) 03/09/2017   Back pain, thoracic 04/28/2012   Obesity (BMI 30.0-34.9) 04/28/2012   Essential hypertension 01/22/2012    Past Medical History:  Diagnosis Date   A-fib (HCC)    Hypertension     Past Surgical History:  Procedure Laterality Date   None Surgeries      Social History   Socioeconomic History   Marital status: Married    Spouse name: Not on file   Number of children: 4   Years of education: Not on file   Highest education level: Not on file  Occupational History   Not on file  Tobacco Use   Smoking status: Never   Smokeless tobacco:  Never  Vaping Use   Vaping Use: Never used  Substance and Sexual Activity   Alcohol use: No   Drug use: No   Sexual activity: Yes    Partners: Male  Other Topics Concern   Not on file  Social History Narrative   Married   4 grown children   Restaurant Cook   Social Determinants of Health   Financial Resource Strain: Low Risk  (05/16/2019)   Overall Financial Resource Strain (CARDIA)    Difficulty of Paying Living Expenses: Not hard at all  Food Insecurity: No Food Insecurity (05/16/2019)   Hunger Vital Sign    Worried About Running Out of Food in the Last Year: Never true    Ran Out of Food in the Last Year: Never true  Transportation Needs: No Transportation Needs (05/16/2019)   PRAPARE - Administrator, Civil ServiceTransportation    Lack of Transportation (Medical): No    Lack of Transportation (Non-Medical): No  Physical Activity: Insufficiently Active (05/16/2019)   Exercise Vital Sign    Days of Exercise per Week: 3 days    Minutes of Exercise per Session: 30 min  Stress: No Stress Concern Present (05/16/2019)   [employer] - Occupational Stress Questionnaire    Feeling of Stress : Only a little  Social Connections: Unknown (05/16/2019)   Social Connection and Isolation Panel [NHANES]    Frequency of Communication with Friends  and Family: Three times a week    Frequency of Social Gatherings with Friends and Family: Twice a week    Attends Religious Services: Patient refused    Active Member of Clubs or Organizations: Patient refused    Attends Banker Meetings: Patient refused    Marital Status: Married  Catering manager Violence: Not At Risk (05/16/2019)   Humiliation, Afraid, Rape, and Kick questionnaire    Fear of Current or Ex-Partner: No    Emotionally Abused: No    Physically Abused: No    Sexually Abused: No    History reviewed. No pertinent family history.   Review of Systems  Constitutional: Negative.  Negative for chills and fever.   HENT: Negative.  Negative for congestion and sore throat.   Respiratory: Negative.  Negative for cough and shortness of breath.   Cardiovascular: Negative.  Negative for chest pain and palpitations.  Gastrointestinal: Negative.  Negative for abdominal pain, diarrhea, nausea and vomiting.  Genitourinary: Negative.   Skin: Negative.  Negative for rash.  Neurological: Negative.  Negative for dizziness and headaches.  Psychiatric/Behavioral:  The patient has insomnia.   All other systems reviewed and are negative.  Today's Vitals   03/03/22 0810 03/03/22 0827  BP: (!) 140/78 130/70  Pulse: 74   Temp: 98.4 F (36.9 C)   TempSrc: Oral   SpO2: 98%   Weight: 181 lb 8 oz (82.3 kg)   Height: 5\' 5"  (1.651 m)    Body mass index is 30.2 kg/m. Wt Readings from Last 3 Encounters:  03/03/22 181 lb 8 oz (82.3 kg)  08/26/21 182 lb (82.6 kg)  07/08/21 183 lb 9.6 oz (83.3 kg)     Physical Exam Vitals reviewed.  Constitutional:      Appearance: Normal appearance.  HENT:     Head: Normocephalic.     Mouth/Throat:     Mouth: Mucous membranes are moist.     Pharynx: Oropharynx is clear.  Eyes:     Extraocular Movements: Extraocular movements intact.     Conjunctiva/sclera: Conjunctivae normal.     Pupils: Pupils are equal, round, and reactive to light.  Cardiovascular:     Rate and Rhythm: Normal rate and regular rhythm.     Pulses: Normal pulses.     Heart sounds: Normal heart sounds.  Pulmonary:     Effort: Pulmonary effort is normal.     Breath sounds: Normal breath sounds.  Musculoskeletal:     Cervical back: No tenderness.     Right lower leg: No edema.     Left lower leg: No edema.  Lymphadenopathy:     Cervical: No cervical adenopathy.  Skin:    Capillary Refill: Capillary refill takes less than 2 seconds.  Neurological:     General: No focal deficit present.     Mental Status: She is alert and oriented to person, place, and time.  Psychiatric:        Mood and Affect:  Mood normal.        Behavior: Behavior normal.      ASSESSMENT & PLAN: A total of 47 minutes was spent with the patient and counseling/coordination of care regarding preparing for this visit, review of most recent office visit notes, review of most recent blood work results, review of multiple chronic medical problems and their management, review of all medications, cardiovascular risks associated with hypertension and dyslipidemia, education on nutrition, prognosis, documentation, and need for follow-up.  Problem List Items Addressed This Visit  Cardiovascular and Mediastinum   Essential hypertension - Primary    Well-controlled hypertension. Continue Tenoretic 100-25 mg daily Cardiovascular risk associated with hypertension discussed Diet and nutrition discussed Follow-up in 6 months.      Relevant Orders   CBC with Differential/Platelet   Comprehensive metabolic panel   Atrial fibrillation (HCC)    Stable.  Presently in normal sinus rhythm. Continue Tambocor 50 mg twice a day for rhythm control Tenoretic 100-25 for rate control        Hematopoietic and Hemostatic   Thrombophilia (HCC)    On long-term anticoagulation with Xarelto 20 mg daily.        Other   Current use of long term anticoagulation    No bleeding episodes.  Stable. Fall precautions given.      Dyslipidemia    Stable.  Cardiovascular risks associated with dyslipidemia discussed. Diet and nutrition discussed. Continue rosuvastatin 10 mg daily. Lipid profile done today. The 10-year ASCVD risk score (Arnett DK, et al., 2019) is: 8.5%   Values used to calculate the score:     Age: 3 years     Sex: Female     Is Non-Hispanic African American: Yes     Diabetic: No     Tobacco smoker: No     Systolic Blood Pressure: 130 mmHg     Is BP treated: Yes     HDL Cholesterol: 64.4 mg/dL     Total Cholesterol: 184 mg/dL       Relevant Orders   Lipid panel   Prediabetes    Diet and nutrition  discussed. Hemoglobin A1c done today.      Relevant Orders   Hemoglobin A1c   Other Visit Diagnoses     Colon cancer screening       Relevant Orders   Cologuard      Patient Instructions  Hypertension, Adult High blood pressure (hypertension) is when the force of blood pumping through the arteries is too strong. The arteries are the blood vessels that carry blood from the heart throughout the body. Hypertension forces the heart to work harder to pump blood and may cause arteries to become narrow or stiff. Untreated or uncontrolled hypertension can lead to a heart attack, heart failure, a stroke, kidney disease, and other problems. A blood pressure reading consists of a higher number over a lower number. Ideally, your blood pressure should be below 120/80. The first ("top") number is called the systolic pressure. It is a measure of the pressure in your arteries as your heart beats. The second ("bottom") number is called the diastolic pressure. It is a measure of the pressure in your arteries as the heart relaxes. What are the causes? The exact cause of this condition is not known. There are some conditions that result in high blood pressure. What increases the risk? Certain factors may make you more likely to develop high blood pressure. Some of these risk factors are under your control, including: Smoking. Not getting enough exercise or physical activity. Being overweight. Having too much fat, sugar, calories, or salt (sodium) in your diet. Drinking too much alcohol. Other risk factors include: Having a personal history of heart disease, diabetes, high cholesterol, or kidney disease. Stress. Having a family history of high blood pressure and high cholesterol. Having obstructive sleep apnea. Age. The risk increases with age. What are the signs or symptoms? High blood pressure may not cause symptoms. Very high blood pressure (hypertensive crisis) may cause: Headache. Fast or  irregular heartbeats (palpitations).  Shortness of breath. Nosebleed. Nausea and vomiting. Vision changes. Severe chest pain, dizziness, and seizures. How is this diagnosed? This condition is diagnosed by measuring your blood pressure while you are seated, with your arm resting on a flat surface, your legs uncrossed, and your feet flat on the floor. The cuff of the blood pressure monitor will be placed directly against the skin of your upper arm at the level of your heart. Blood pressure should be measured at least twice using the same arm. Certain conditions can cause a difference in blood pressure between your right and left arms. If you have a high blood pressure reading during one visit or you have normal blood pressure with other risk factors, you may be asked to: Return on a different day to have your blood pressure checked again. Monitor your blood pressure at home for 1 week or longer. If you are diagnosed with hypertension, you may have other blood or imaging tests to help your health care provider understand your overall risk for other conditions. How is this treated? This condition is treated by making healthy lifestyle changes, such as eating healthy foods, exercising more, and reducing your alcohol intake. You may be referred for counseling on a healthy diet and physical activity. Your health care provider may prescribe medicine if lifestyle changes are not enough to get your blood pressure under control and if: Your systolic blood pressure is above 130. Your diastolic blood pressure is above 80. Your personal target blood pressure may vary depending on your medical conditions, your age, and other factors. Follow these instructions at home: Eating and drinking  Eat a diet that is high in fiber and potassium, and low in sodium, added sugar, and fat. An example of this eating plan is called the DASH diet. DASH stands for Dietary Approaches to Stop Hypertension. To eat this way: Eat  plenty of fresh fruits and vegetables. Try to fill one half of your plate at each meal with fruits and vegetables. Eat whole grains, such as whole-wheat pasta, brown rice, or whole-grain bread. Fill about one fourth of your plate with whole grains. Eat or drink low-fat dairy products, such as skim milk or low-fat yogurt. Avoid fatty cuts of meat, processed or cured meats, and poultry with skin. Fill about one fourth of your plate with lean proteins, such as fish, chicken without skin, beans, eggs, or tofu. Avoid pre-made and processed foods. These tend to be higher in sodium, added sugar, and fat. Reduce your daily sodium intake. Many people with hypertension should eat less than 1,500 mg of sodium a day. Do not drink alcohol if: Your health care provider tells you not to drink. You are pregnant, may be pregnant, or are planning to become pregnant. If you drink alcohol: Limit how much you have to: 0-1 drink a day for women. 0-2 drinks a day for men. Know how much alcohol is in your drink. In the [place] one drink equals one 12 oz bottle of beer (355 mL), one 5 oz glass of wine (148 mL), or one 1 oz glass of hard liquor (44 mL). Lifestyle  Work with your health care provider to maintain a healthy body weight or to lose weight. Ask what an ideal weight is for you. Get at least 30 minutes of exercise that causes your heart to beat faster (aerobic exercise) most days of the week. Activities may include walking, swimming, or biking. Include exercise to strengthen your muscles (resistance exercise), such as Pilates or lifting weights,  as part of your weekly exercise routine. Try to do these types of exercises for 30 minutes at least 3 days a week. Do not use any products that contain nicotine or tobacco. These products include cigarettes, chewing tobacco, and vaping devices, such as e-cigarettes. If you need help quitting, ask your health care provider. Monitor your blood pressure at home as told by  your health care provider. Keep all follow-up visits. This is important. Medicines Take over-the-counter and prescription medicines only as told by your health care provider. Follow directions carefully. Blood pressure medicines must be taken as prescribed. Do not skip doses of blood pressure medicine. Doing this puts you at risk for problems and can make the medicine less effective. Ask your health care provider about side effects or reactions to medicines that you should watch for. Contact a health care provider if you: Think you are having a reaction to a medicine you are taking. Have headaches that keep coming back (recurring). Feel dizzy. Have swelling in your ankles. Have trouble with your vision. Get help right away if you: Develop a severe headache or confusion. Have unusual weakness or numbness. Feel faint. Have severe pain in your chest or abdomen. Vomit repeatedly. Have trouble breathing. These symptoms may be an emergency. Get help right away. Call 911. Do not wait to see if the symptoms will go away. Do not drive yourself to the hospital. Summary Hypertension is when the force of blood pumping through your arteries is too strong. If this condition is not controlled, it may put you at risk for serious complications. Your personal target blood pressure may vary depending on your medical conditions, your age, and other factors. For most people, a normal blood pressure is less than 120/80. Hypertension is treated with lifestyle changes, medicines, or a combination of both. Lifestyle changes include losing weight, eating a healthy, low-sodium diet, exercising more, and limiting alcohol. This information is not intended to replace advice given to you by your health care provider. Make sure you discuss any questions you have with your health care provider. Document Revised: 05/21/2021 Document Reviewed: 05/21/2021 Elsevier Patient Education  2023 Elsevier Inc.    Edwina Barth, MD [facility]

## 2022-03-03 NOTE — Assessment & Plan Note (Signed)
Well-controlled hypertension. Continue Tenoretic 100-25 mg daily Cardiovascular risk associated with hypertension discussed Diet and nutrition discussed Follow-up in 6 months.

## 2022-03-03 NOTE — Assessment & Plan Note (Signed)
Stable.  Presently in normal sinus rhythm. Continue Tambocor 50 mg twice a day for rhythm control Tenoretic 100-25 for rate control

## 2022-03-03 NOTE — Assessment & Plan Note (Signed)
On long-term anticoagulation with Xarelto 20 mg daily.

## 2022-03-03 NOTE — Assessment & Plan Note (Signed)
No bleeding episodes.  Stable. Fall precautions given.

## 2022-03-03 NOTE — Assessment & Plan Note (Signed)
Diet and nutrition discussed.  Hemoglobin A1c done today. 

## 2022-03-03 NOTE — Patient Instructions (Signed)
Hypertension, Adult High blood pressure (hypertension) is when the force of blood pumping through the arteries is too strong. The arteries are the blood vessels that carry blood from the heart throughout the body. Hypertension forces the heart to work harder to pump blood and may cause arteries to become narrow or stiff. Untreated or uncontrolled hypertension can lead to a heart attack, heart failure, a stroke, kidney disease, and other problems. A blood pressure reading consists of a higher number over a lower number. Ideally, your blood pressure should be below 120/80. The first ("top") number is called the systolic pressure. It is a measure of the pressure in your arteries as your heart beats. The second ("bottom") number is called the diastolic pressure. It is a measure of the pressure in your arteries as the heart relaxes. What are the causes? The exact cause of this condition is not known. There are some conditions that result in high blood pressure. What increases the risk? Certain factors may make you more likely to develop high blood pressure. Some of these risk factors are under your control, including: Smoking. Not getting enough exercise or physical activity. Being overweight. Having too much fat, sugar, calories, or salt (sodium) in your diet. Drinking too much alcohol. Other risk factors include: Having a personal history of heart disease, diabetes, high cholesterol, or kidney disease. Stress. Having a family history of high blood pressure and high cholesterol. Having obstructive sleep apnea. Age. The risk increases with age. What are the signs or symptoms? High blood pressure may not cause symptoms. Very high blood pressure (hypertensive crisis) may cause: Headache. Fast or irregular heartbeats (palpitations). Shortness of breath. Nosebleed. Nausea and vomiting. Vision changes. Severe chest pain, dizziness, and seizures. How is this diagnosed? This condition is diagnosed by  measuring your blood pressure while you are seated, with your arm resting on a flat surface, your legs uncrossed, and your feet flat on the floor. The cuff of the blood pressure monitor will be placed directly against the skin of your upper arm at the level of your heart. Blood pressure should be measured at least twice using the same arm. Certain conditions can cause a difference in blood pressure between your right and left arms. If you have a high blood pressure reading during one visit or you have normal blood pressure with other risk factors, you may be asked to: Return on a different day to have your blood pressure checked again. Monitor your blood pressure at home for 1 week or longer. If you are diagnosed with hypertension, you may have other blood or imaging tests to help your health care provider understand your overall risk for other conditions. How is this treated? This condition is treated by making healthy lifestyle changes, such as eating healthy foods, exercising more, and reducing your alcohol intake. You may be referred for counseling on a healthy diet and physical activity. Your health care provider may prescribe medicine if lifestyle changes are not enough to get your blood pressure under control and if: Your systolic blood pressure is above 130. Your diastolic blood pressure is above 80. Your personal target blood pressure may vary depending on your medical conditions, your age, and other factors. Follow these instructions at home: Eating and drinking  Eat a diet that is high in fiber and potassium, and low in sodium, added sugar, and fat. An example of this eating plan is called the DASH diet. DASH stands for Dietary Approaches to Stop Hypertension. To eat this way: Eat   plenty of fresh fruits and vegetables. Try to fill one half of your plate at each meal with fruits and vegetables. Eat whole grains, such as whole-wheat pasta, brown rice, or whole-grain bread. Fill about one  fourth of your plate with whole grains. Eat or drink low-fat dairy products, such as skim milk or low-fat yogurt. Avoid fatty cuts of meat, processed or cured meats, and poultry with skin. Fill about one fourth of your plate with lean proteins, such as fish, chicken without skin, beans, eggs, or tofu. Avoid pre-made and processed foods. These tend to be higher in sodium, added sugar, and fat. Reduce your daily sodium intake. Many people with hypertension should eat less than 1,500 mg of sodium a day. Do not drink alcohol if: Your health care provider tells you not to drink. You are pregnant, may be pregnant, or are planning to become pregnant. If you drink alcohol: Limit how much you have to: 0-1 drink a day for women. 0-2 drinks a day for men. Know how much alcohol is in your drink. In the [place] one drink equals one 12 oz bottle of beer (355 mL), one 5 oz glass of wine (148 mL), or one 1 oz glass of hard liquor (44 mL). Lifestyle  Work with your health care provider to maintain a healthy body weight or to lose weight. Ask what an ideal weight is for you. Get at least 30 minutes of exercise that causes your heart to beat faster (aerobic exercise) most days of the week. Activities may include walking, swimming, or biking. Include exercise to strengthen your muscles (resistance exercise), such as Pilates or lifting weights, as part of your weekly exercise routine. Try to do these types of exercises for 30 minutes at least 3 days a week. Do not use any products that contain nicotine or tobacco. These products include cigarettes, chewing tobacco, and vaping devices, such as e-cigarettes. If you need help quitting, ask your health care provider. Monitor your blood pressure at home as told by your health care provider. Keep all follow-up visits. This is important. Medicines Take over-the-counter and prescription medicines only as told by your health care provider. Follow directions carefully. Blood  pressure medicines must be taken as prescribed. Do not skip doses of blood pressure medicine. Doing this puts you at risk for problems and can make the medicine less effective. Ask your health care provider about side effects or reactions to medicines that you should watch for. Contact a health care provider if you: Think you are having a reaction to a medicine you are taking. Have headaches that keep coming back (recurring). Feel dizzy. Have swelling in your ankles. Have trouble with your vision. Get help right away if you: Develop a severe headache or confusion. Have unusual weakness or numbness. Feel faint. Have severe pain in your chest or abdomen. Vomit repeatedly. Have trouble breathing. These symptoms may be an emergency. Get help right away. Call 911. Do not wait to see if the symptoms will go away. Do not drive yourself to the hospital. Summary Hypertension is when the force of blood pumping through your arteries is too strong. If this condition is not controlled, it may put you at risk for serious complications. Your personal target blood pressure may vary depending on your medical conditions, your age, and other factors. For most people, a normal blood pressure is less than 120/80. Hypertension is treated with lifestyle changes, medicines, or a combination of both. Lifestyle changes include losing weight, eating a healthy,   low-sodium diet, exercising more, and limiting alcohol. This information is not intended to replace advice given to you by your health care provider. Make sure you discuss any questions you have with your health care provider. Document Revised: 05/21/2021 Document Reviewed: 05/21/2021 Elsevier Patient Education  2023 Elsevier Inc.  

## 2022-03-07 DIAGNOSIS — Z1211 Encounter for screening for malignant neoplasm of colon: Secondary | ICD-10-CM | POA: Diagnosis not present

## 2022-03-17 LAB — COLOGUARD: COLOGUARD: NEGATIVE

## 2022-03-28 ENCOUNTER — Telehealth: Payer: Self-pay | Admitting: Emergency Medicine

## 2022-03-28 DIAGNOSIS — I480 Paroxysmal atrial fibrillation: Secondary | ICD-10-CM

## 2022-03-28 NOTE — Telephone Encounter (Signed)
Note not needed 

## 2022-03-28 NOTE — Telephone Encounter (Signed)
Called patient and informed her that her atenolol is not due for a new rx, patient will need to call pharmacy to request a refill. I sent in a new rx for her flecainide to her requested pharmacy

## 2022-04-01 ENCOUNTER — Telehealth: Payer: Self-pay | Admitting: Emergency Medicine

## 2022-04-01 NOTE — Telephone Encounter (Signed)
Called patient and left message for patient to call office back pertaining to medication assistance

## 2022-04-01 NOTE — Telephone Encounter (Signed)
Patient returned your call - please call back - per Roe Coombs

## 2022-04-01 NOTE — Telephone Encounter (Signed)
Patient needs help getting her Worthy Rancher - please call patient.

## 2022-04-02 NOTE — Telephone Encounter (Signed)
Called patient to inform her that we have a savings card for the Xarelto . Patient is coming in on Friday to pick this saving cards up. Will be at the front desk

## 2022-04-04 NOTE — Telephone Encounter (Signed)
PT has picked up Xarelto savings card.

## 2022-06-23 ENCOUNTER — Telehealth: Payer: Self-pay | Admitting: Emergency Medicine

## 2022-06-23 NOTE — Telephone Encounter (Signed)
Left message for patient to call back to schedule Medicare Annual Wellness Visit   No hx of AWV eligible as of 06/27/22  Please schedule at anytime with [facility] if patient calls the office back.     Any questions, please call me at [phone] 

## 2022-06-27 ENCOUNTER — Other Ambulatory Visit: Payer: Self-pay

## 2022-06-27 DIAGNOSIS — I480 Paroxysmal atrial fibrillation: Secondary | ICD-10-CM

## 2022-06-27 MED ORDER — FLECAINIDE ACETATE 50 MG PO TABS: 50.0000 mg | ORAL_TABLET | Freq: Two times a day (BID) | ORAL | 1 refills | Status: DC

## 2022-06-30 ENCOUNTER — Other Ambulatory Visit: Payer: Self-pay | Admitting: Emergency Medicine

## 2022-06-30 DIAGNOSIS — I10 Essential (primary) hypertension: Secondary | ICD-10-CM

## 2022-07-07 ENCOUNTER — Ambulatory Visit: Payer: Medicare Other | Admitting: Internal Medicine

## 2022-07-07 ENCOUNTER — Encounter: Payer: Self-pay | Admitting: Internal Medicine

## 2022-07-07 VITALS — BP 128/84 | HR 79 | Resp 16 | Ht 65.0 in | Wt 180.8 lb

## 2022-07-07 DIAGNOSIS — E7800 Pure hypercholesterolemia, unspecified: Secondary | ICD-10-CM

## 2022-07-07 DIAGNOSIS — I480 Paroxysmal atrial fibrillation: Secondary | ICD-10-CM

## 2022-07-07 DIAGNOSIS — I10 Essential (primary) hypertension: Secondary | ICD-10-CM | POA: Diagnosis not present

## 2022-07-07 NOTE — Progress Notes (Signed)
Primary Physician/Referring:  Georgina Quint, MD  Patient ID: Mandy Price, female    DOB: 1956/05/13, 66 y.o.   MRN: [id number]  Chief Complaint  Patient presents with  . Atrial Fibrillation  . Hypertension   HPI:    Mandy Price  is a 66 y.o. female with hypertension, atrial fibrillation. Lexiscan nuclear stress testing in July 2020 that was considered low risk study.  Echocardiogram was also performed in August 2020 that revealed normal LVEF.  Patient preferred antiarrythmic therapy to cardioversion, therefore is presently on low dose flecainide.   Patient presents for annual visit with paroxysmal atrial fibrillation, hypertension, hyperlipidemia.  Patient has had no known recurrence of atrial fibrillation.  Denies dyspnea, chest pain, palpitations, syncope, near syncope.  Denies orthopnea, PND, dizziness, leg swelling.  She continues to tolerate anticoagulation without bleeding diathesis.  Personally reviewed external labs, LDL remains elevated at 110, A1c improved to 5.8%.  Past Medical History:  Diagnosis Date  . A-fib (HCC)   . Hyperlipidemia   . Hypertension    Past Surgical History:  Procedure Laterality Date  . None Surgeries     History reviewed. No pertinent family history.  Social History   Tobacco Use  . Smoking status: Never  . Smokeless tobacco: Never  Substance Use Topics  . Alcohol use: No   Marital Status: Married   ROS  Review of Systems  Cardiovascular:  Negative for chest pain, claudication, dyspnea on exertion, leg swelling, near-syncope, orthopnea, palpitations, paroxysmal nocturnal dyspnea and syncope.  Respiratory:  Negative for shortness of breath.     Objective  Blood pressure 128/84, pulse 79, resp. rate 16, height 5\' 5"  (1.651 m), weight 180 lb 12.8 oz (82 kg), SpO2 97 %.     07/07/2022   12:57 PM 03/03/2022    8:27 AM 03/03/2022    8:10 AM  Vitals with BMI  Height 5\' 5"   5\' 5"   Weight 180 lbs 13 oz  181 lbs 8 oz  BMI  30.09  30.2  Systolic 128 130 05/03/2022  Diastolic 84 70 78  Pulse 79  74      Physical Exam Vitals reviewed.  Constitutional:      Appearance: She is obese.  Cardiovascular:     Rate and Rhythm: Normal rate and regular rhythm.     Pulses: Intact distal pulses.     Heart sounds: S1 normal and S2 normal. No murmur heard.    No gallop.  Pulmonary:     Effort: Pulmonary effort is normal. No respiratory distress.     Breath sounds: No wheezing, rhonchi or rales.  Musculoskeletal:     Right lower leg: No edema.     Left lower leg: No edema.  Neurological:     Mental Status: She is alert.    Laboratory examination:   Recent Labs    08/26/21 1125 03/03/22 0843  NA 137 138  K 3.3* 3.6  CL 99 97  CO2 30 30  GLUCOSE 87 92  BUN 15 18  CREATININE 0.92 1.00  CALCIUM 9.6 9.7    CrCl cannot be calculated (Patient's most recent lab result is older than the maximum 21 days allowed.).     Latest Ref Rng & Units 03/03/2022    8:43 AM 08/26/2021   11:25 AM 02/08/2021    8:05 AM  CMP  Glucose 70 - 99 mg/dL 92  87  91   BUN 6 - 23 mg/dL 18  15  18    Creatinine  0.40 - 1.20 mg/dL 3.88  8.28  0.03   Sodium 135 - 145 mEq/L 138  137  139   Potassium 3.5 - 5.1 mEq/L 3.6  3.3  3.7   Chloride 96 - 112 mEq/L 97  99  101   CO2 19 - 32 mEq/L 30  30  29    Calcium 8.4 - 10.5 mg/dL 9.7  9.6  9.4   Total Protein 6.0 - 8.3 g/dL 8.0  7.8  7.7   Total Bilirubin 0.2 - 1.2 mg/dL 0.5  0.5  0.7   Alkaline Phos 39 - 117 U/L 52  55  55   AST 0 - 37 U/L 21  22  29    ALT 0 - 35 U/L 22  28  28        Latest Ref Rng & Units 03/03/2022    8:43 AM 08/26/2021   11:25 AM 02/08/2021    8:05 AM  CBC  WBC 4.0 - 10.5 K/uL 6.1  6.0  5.7   Hemoglobin 12.0 - 15.0 g/dL 05/03/2022  08/28/2021  02/10/2021   Hematocrit 36.0 - 46.0 % 36.8  37.7  35.8   Platelets 150.0 - 400.0 K/uL 229.0  254.0  212.0     Lipid Panel Recent Labs    08/26/21 1125 03/03/22 0843  CHOL 184 196  TRIG 135.0 109.0  LDLCALC 92 108*  VLDL 27.0 21.8  HDL  64.40 50.5  CHOLHDL 3 3     HEMOGLOBIN A1C Lab Results  Component Value Date   HGBA1C 5.7 03/03/2022   TSH No results for input(s): "TSH" in the last 8760 hours.  External labs:  None   Allergies  No Known Allergies   Medications Prior to Visit:   Outpatient Medications Prior to Visit  Medication Sig Dispense Refill  . atenolol-chlorthalidone (TENORETIC) 100-25 MG tablet Take 1 tablet by mouth once daily 90 tablet 3  . flecainide (TAMBOCOR) 50 MG tablet Take 1 tablet (50 mg total) by mouth 2 (two) times daily. 180 tablet 1  . OMEGA-3 KRILL OIL PO Take by mouth daily.    . rivaroxaban (XARELTO) 20 MG TABS tablet TAKE 1 TABLET DAILY WITH SUPPER 90 tablet 2  . rosuvastatin (CRESTOR) 10 MG tablet Take 1 tablet (10 mg total) by mouth daily. 90 tablet 3   No facility-administered medications prior to visit.   Final Medications at End of Visit    Current Meds  Medication Sig  . atenolol-chlorthalidone (TENORETIC) 100-25 MG tablet Take 1 tablet by mouth once daily  . flecainide (TAMBOCOR) 50 MG tablet Take 1 tablet (50 mg total) by mouth 2 (two) times daily.  . OMEGA-3 KRILL OIL PO Take by mouth daily.  . rivaroxaban (XARELTO) 20 MG TABS tablet TAKE 1 TABLET DAILY WITH SUPPER  . rosuvastatin (CRESTOR) 10 MG tablet Take 1 tablet (10 mg total) by mouth daily.   Radiology:   No results found.  Cardiac Studies:   Echocardiogram 03/25/2019: Left ventricle cavity is normal in size. Normal left ventricular wall thickness. Normal LV systolic function with EF 55%. Normal global wall motion. Unable to evaluate diastolic function due to atrial fibrillation. Calculated EF 55%. Left atrial cavity is mildly dilated. Mild (Grade I) mitral regurgitation. Mild to moderate tricuspid regurgitation. Estimated pulmonary artery systolic pressure is 26 mmHg. Compared to previous study in 2018, PA pressures have improved.    Lexiscan Sestamibi stress test 02/18/2019: Lexiscan stress test was  performed. Stress EKG is non-diagnostic, as this is pharmacological stress  test. Myocardial perfusion imaging is normal. LVEF 58%. Low risk study.  EKG:  07/08/2021: Sinus rhythm at a rate of 76 bpm.  Normal axis. IRBBB. Diffuse nonspecific T wave abnormality, slightly more prominent than EKG 07/03/2020.   EKG 07/03/2020: Normal sinus rhythm at a rate of 69 bpm, left atrial enlargement.  Normal axis. Incomplete RBBB. Diffuse nonspecific T wave abnormality.  Compared to EKG 05/04/2019, no significant change.   Assessment     ICD-10-CM   1. Paroxysmal atrial fibrillation (HCC)  I48.0 EKG 12-Lead       There are no discontinued medications.  No orders of the defined types were placed in this encounter.  CHA2DS2-VASc Score is 2 with yearly risk of stroke of 2.2 %. HAS-Bled score is 0 and estimated major bleeding in one year is 0.6-1.13 %  Recommendations:   Mandy Price is a 66 y.o. female with hypertension, atrial fibrillation. Lexiscan nuclear stress testing in July 2020 that was considered low risk study.  Echocardiogram was also performed in August 2020 that revealed normal LVEF.  Patient preferred antiarrythmic therapy to cardioversion, therefore is presently on low dose flecainide.   Patient presents for annual visit with paroxysmal atrial fibrillation, hypertension, hyperlipidemia.  She has had no known recurrence of A. fib since starting flecainide.  She continues to tolerate anticoagulation without bleeding diathesis.  Patient's blood pressure is well controlled.  In regard to hyperlipidemia, patient's blood pressure remains elevated at 110.  Patient's estimated 10-year ASCVD risk is 9%, therefore recommend initiation of statin therapy, patient is agreeable.  We will start atorvastatin 20 mg daily.  Will defer repeat lipid profile testing to PCP at upcoming appointment.  Follow-up in 1 year, sooner if needed, for paroxysmal atrial fibrillation, hypertension,  hyperlipidemia.   Clotilde Dieter, DO, [facility] 07/07/2022, 1:16 PM Office: [phone]

## 2022-07-08 ENCOUNTER — Ambulatory Visit: Payer: Medicare Other | Admitting: Student

## 2022-08-18 ENCOUNTER — Other Ambulatory Visit: Payer: Self-pay | Admitting: Emergency Medicine

## 2022-08-18 DIAGNOSIS — Z1231 Encounter for screening mammogram for malignant neoplasm of breast: Secondary | ICD-10-CM

## 2022-09-03 ENCOUNTER — Ambulatory Visit: Payer: Medicare Other | Admitting: Emergency Medicine

## 2022-09-08 ENCOUNTER — Ambulatory Visit (INDEPENDENT_AMBULATORY_CARE_PROVIDER_SITE_OTHER): Payer: Medicare Other | Admitting: Emergency Medicine

## 2022-09-08 ENCOUNTER — Encounter: Payer: Self-pay | Admitting: Emergency Medicine

## 2022-09-08 VITALS — BP 130/70 | HR 66 | Temp 98.2°F | Ht 65.0 in | Wt 179.2 lb

## 2022-09-08 DIAGNOSIS — I4811 Longstanding persistent atrial fibrillation: Secondary | ICD-10-CM

## 2022-09-08 DIAGNOSIS — Z7901 Long term (current) use of anticoagulants: Secondary | ICD-10-CM | POA: Diagnosis not present

## 2022-09-08 DIAGNOSIS — R7303 Prediabetes: Secondary | ICD-10-CM | POA: Diagnosis not present

## 2022-09-08 DIAGNOSIS — I10 Essential (primary) hypertension: Secondary | ICD-10-CM

## 2022-09-08 DIAGNOSIS — D6859 Other primary thrombophilia: Secondary | ICD-10-CM

## 2022-09-08 DIAGNOSIS — E785 Hyperlipidemia, unspecified: Secondary | ICD-10-CM

## 2022-09-08 LAB — LIPID PANEL
Cholesterol: 199 mg/dL (ref 0–200)
HDL: 70.3 mg/dL (ref >39.00)
LDL Cholesterol: 112 mg/dL — ABNORMAL HIGH (ref 0–99)
NonHDL: 128.23
Total CHOL/HDL Ratio: 3
Triglycerides: 82 mg/dL (ref 0.0–149.0)
VLDL: 16.4 mg/dL (ref 0.0–40.0)

## 2022-09-08 LAB — CBC WITH DIFFERENTIAL/PLATELET
Basophils Absolute: 0.1 10*3/uL (ref 0.0–0.1)
Basophils Relative: 1.1 % (ref 0.0–3.0)
Eosinophils Absolute: 0 10*3/uL (ref 0.0–0.7)
Eosinophils Relative: 0.8 % (ref 0.0–5.0)
HCT: 37.2 % (ref 36.0–46.0)
Hemoglobin: 12.1 g/dL (ref 12.0–15.0)
Lymphocytes Relative: 39 % (ref 12.0–46.0)
Lymphs Abs: 2 10*3/uL (ref 0.7–4.0)
MCHC: 32.5 g/dL (ref 30.0–36.0)
MCV: 77.9 fl — ABNORMAL LOW (ref 78.0–100.0)
Monocytes Absolute: 0.5 10*3/uL (ref 0.1–1.0)
Monocytes Relative: 8.7 % (ref 3.0–12.0)
Neutro Abs: 2.6 10*3/uL (ref 1.4–7.7)
Neutrophils Relative %: 50.4 % (ref 43.0–77.0)
Platelets: 250 10*3/uL (ref 150.0–400.0)
RBC: 4.77 Mil/uL (ref 3.87–5.11)
RDW: 13.9 % (ref 11.5–15.5)
WBC: 5.3 10*3/uL (ref 4.0–10.5)

## 2022-09-08 LAB — COMPREHENSIVE METABOLIC PANEL
ALT: 30 U/L (ref 0–35)
AST: 29 U/L (ref 0–37)
Albumin: 4.5 g/dL (ref 3.5–5.2)
Alkaline Phosphatase: 58 U/L (ref 39–117)
BUN: 13 mg/dL (ref 6–23)
CO2: 28 mEq/L (ref 19–32)
Calcium: 9.9 mg/dL (ref 8.4–10.5)
Chloride: 98 mEq/L (ref 96–112)
Creatinine, Ser: 0.94 mg/dL (ref 0.40–1.20)
GFR: 63.39 mL/min (ref >60.00)
Glucose, Bld: 90 mg/dL (ref 70–99)
Potassium: 3.6 mEq/L (ref 3.5–5.1)
Sodium: 136 mEq/L (ref 135–145)
Total Bilirubin: 0.6 mg/dL (ref 0.2–1.2)
Total Protein: 7.9 g/dL (ref 6.0–8.3)

## 2022-09-08 LAB — HEMOGLOBIN A1C: Hgb A1c MFr Bld: 5.6 % (ref 4.6–6.5)

## 2022-09-08 MED ORDER — ROSUVASTATIN CALCIUM 10 MG PO TABS: 10.0000 mg | ORAL_TABLET | Freq: Every day | ORAL | 3 refills | Status: DC

## 2022-09-08 NOTE — Assessment & Plan Note (Signed)
Diet and nutrition discussed.  Hemoglobin A1c done today. Advised decrease amount of daily carbohydrate intake and daily calories and increase amount of plant-based protein in her diet.

## 2022-09-08 NOTE — Assessment & Plan Note (Signed)
Well-controlled hypertension. Continue Tenoretic 100-25 mg daily. Cardiovascular risks associated with hypertension discussed. Dietary approaches to stop hypertension discussed.

## 2022-09-08 NOTE — Assessment & Plan Note (Signed)
Normal sinus rhythm with well-controlled ventricular rate today. Sees cardiologist on a regular basis. Continue Xarelto 20 mg daily along with flecainide 50 mg twice a day and Tenoretic 100-25 mg daily.

## 2022-09-08 NOTE — Assessment & Plan Note (Signed)
Diet and nutrition discussed. Continue rosuvastatin 10 mg daily. The 10-year ASCVD risk score (Arnett DK, et al., 2019) is: 10.9%   Values used to calculate the score:     Age: 67 years     Sex: Female     Is Non-Hispanic African American: Yes     Diabetic: No     Tobacco smoker: No     Systolic Blood Pressure: 0000000 mmHg     Is BP treated: Yes     HDL Cholesterol: 65.8 mg/dL     Total Cholesterol: 196 mg/dL

## 2022-09-08 NOTE — Assessment & Plan Note (Signed)
Fall precautions given.

## 2022-09-08 NOTE — Progress Notes (Signed)
Mandy Price 67 y.o.   Chief Complaint  Patient presents with   Follow-up    31mth f/u appt, no concerns     HISTORY OF PRESENT ILLNESS: This is a 67y.o. female here for 658-monthollow-up of chronic medical conditions including hypertension and longstanding atrial fibrillation on chronic Xarelto. Doing well.  Has no complaints or medical concerns today. BP Readings from Last 3 Encounters:  09/08/22 138/76  07/07/22 128/84  03/03/22 130/70   Wt Readings from Last 3 Encounters:  09/08/22 179 lb 4 oz (81.3 kg)  07/07/22 180 lb 12.8 oz (82 kg)  03/03/22 181 lb 8 oz (82.3 kg)     HPI   Prior to Admission medications   Medication Sig Start Date End Date Taking? Authorizing Provider  atenolol-chlorthalidone (TENORETIC) 100-25 MG tablet Take 1 tablet by mouth once daily 06/30/22  Yes Janeal Abadi, MiInes BloomerMD  flecainide (TAMBOCOR) 50 MG tablet Take 1 tablet (50 mg total) by mouth 2 (two) times daily. 06/27/22  Yes Jahvon Gosline, MiInes BloomerMD  OMEGA-3 KRILL OIL PO Take by mouth daily.   Yes [provider]  rivaroxaban (XARELTO) 20 MG TABS tablet TAKE 1 TABLET DAILY WITH SUPPER 12/27/21  Yes Lindsay Soulliere, MiInes BloomerMD  rosuvastatin (CRESTOR) 10 MG tablet Take 1 tablet (10 mg total) by mouth daily. 08/26/21  Yes SaHorald PollenMD    No Known Allergies  Patient Active Problem List   Diagnosis Date Noted   Dyslipidemia 08/26/2021   Prediabetes 08/26/2021   Current use of long term anticoagulation 02/04/2021   Thrombophilia ([place]07/05/2021   Atrial flutter ([place]/16/2020   [place]rial [place] ([place]/13/2018   Obesity (BMI 30.0-34.9) 04/28/2012   Essential hypertension 01/22/2012    Past Medical History:  Diagnosis Date   [place]-fib ([place]   Hyperlipidemia    Hypertension     Past Surgical History:  Procedure Laterality Date   None Surgeries      Social History   Socioeconomic History   Marital status: Married    Spouse name: Not on file   Number of  children: 4   Years of education: Not on file   Highest education level: Not on file  Occupational History   Not on file  Tobacco Use   Smoking status: Never   Smokeless tobacco: Never  Vaping Use   Vaping Use: Never used  Substance and Sexual Activity   Alcohol use: No   Drug use: No   Sexual activity: Yes    Partners: Male  Other Topics Concern   Not on file  Social History Narrative   Married   4 grown children   Restaurant Cook   Social Determinants of Health   Financial Resource Strain: Low Risk  (05/16/2019)   Overall Financial Resource Strain (CARDIA)    Difficulty of Paying Living Expenses: Not hard at all  Food Insecurity: No Food Insecurity (05/16/2019)   Hunger Vital Sign    Worried About Running Out of Food in the Last Year: Never true    Ran Out of Food in the Last Year: Never true  Transportation Needs: No Transportation Needs (05/16/2019)   PRAPARE - TrHydrologistMedical): No    Lack of Transportation (Non-Medical): No  Physical Activity: Insufficiently Active (05/16/2019)   Exercise Vital Sign    Days of Exercise per Week: 3 days    Minutes of Exercise per Session: 30 min  Stress: No Stress Concern Present (05/16/2019)  [place] Questionnaire    Feeling of Stress : Only a little  Social Connections: Unknown (05/16/2019)   Social Connection and Isolation Panel [NHANES]    Frequency of Communication with Friends and Family: Three times a week    Frequency of Social Gatherings with Friends and Family: Twice a week    Attends Religious Services: Patient refused    Active Member of Clubs or Organizations: Patient refused    Attends Archivist Meetings: Patient refused    Marital Status: Married  Human resources officer Violence: Not At Risk (05/16/2019)   Humiliation, Afraid, Rape, and Kick questionnaire    Fear of Current or Ex-Partner: No    Emotionally Abused: No     Physically Abused: No    Sexually Abused: No    No family history on file.   Review of Systems  Constitutional: Negative.  Negative for chills and fever.  HENT: Negative.  Negative for congestion and sore throat.   Respiratory: Negative.  Negative for cough and shortness of breath.   Cardiovascular: Negative.  Negative for chest pain and claudication.  Gastrointestinal:  Negative for abdominal pain, diarrhea, nausea and vomiting.  Genitourinary: Negative.  Negative for dysuria and hematuria.  Skin: Negative.  Negative for rash.  Neurological: Negative.  Negative for dizziness and headaches.   Today's Vitals   09/08/22 0802 09/08/22 0824  BP: 138/76 130/70  Pulse: 66   Temp: 98.2 F (36.8 C)   TempSrc: Oral   SpO2: 97%   Weight: 179 lb 4 oz (81.3 kg)   Height: 5' 5"$  (1.651 m)    Body mass index is 29.83 kg/m.   Physical Exam Vitals reviewed.  Constitutional:      Appearance: Normal appearance.  HENT:     Head: Normocephalic and atraumatic.  Eyes:     Extraocular Movements: Extraocular movements intact.     Conjunctiva/sclera: Conjunctivae normal.     Pupils: Pupils are equal, round, and reactive to light.  Cardiovascular:     Rate and Rhythm: Normal rate and regular rhythm.  Pulmonary:     Effort: Pulmonary effort is normal.     Breath sounds: Normal breath sounds.  Abdominal:     Palpations: Abdomen is soft.     Tenderness: There is no abdominal tenderness.  Musculoskeletal:     Cervical back: No tenderness.     Right lower leg: No edema.     Left lower leg: No edema.  Lymphadenopathy:     Cervical: No cervical adenopathy.  Skin:    General: Skin is warm and dry.  Neurological:     General: No focal deficit present.     Mental Status: She is alert and oriented to person, place, and time.  Psychiatric:        Mood and Affect: Mood normal.        Behavior: Behavior normal.      ASSESSMENT & PLAN: A total of 45 minutes was spent with the patient  and counseling/coordination of care regarding preparing for this visit, review of most recent office visit notes, review of multiple chronic medical conditions and their management, review of all medications, cardiovascular risks associated with hypertension and dyslipidemia, education on nutrition, need for blood work today, review of most recent blood work results, review of health maintenance items, fall precautions, prognosis, documentation, and need for follow-up.  Problem List Items Addressed This Visit       Cardiovascular and Mediastinum   Essential  hypertension - Primary    Well-controlled hypertension. Continue Tenoretic 100-25 mg daily. Cardiovascular risks associated with hypertension discussed. Dietary approaches to stop hypertension discussed.      Relevant Medications   rosuvastatin (CRESTOR) 10 MG tablet   Other Relevant Orders   Comprehensive metabolic panel   Atrial fibrillation (HCC)    Normal sinus rhythm with well-controlled ventricular rate today. Sees cardiologist on a regular basis. Continue Xarelto 20 mg daily along with flecainide 50 mg twice a day and Tenoretic 100-25 mg daily.      Relevant Medications   rosuvastatin (CRESTOR) 10 MG tablet   Other Relevant Orders   CBC with Differential/Platelet     Hematopoietic and Hemostatic   Thrombophilia (HCC)    On chronic anticoagulation with Xarelto 20 mg daily.      Relevant Orders   CBC with Differential/Platelet     Other   Current use of long term anticoagulation    Fall precautions given.      Relevant Orders   CBC with Differential/Platelet   Dyslipidemia    Diet and nutrition discussed. Continue rosuvastatin 10 mg daily. The 10-year ASCVD risk score (Arnett DK, et al., 2019) is: 10.9%   Values used to calculate the score:     Age: 79 years     Sex: Female     Is Non-Hispanic African American: Yes     Diabetic: No     Tobacco smoker: No     Systolic Blood Pressure: 0000000 mmHg     Is BP  treated: Yes     HDL Cholesterol: 65.8 mg/dL     Total Cholesterol: 196 mg/dL       Relevant Medications   rosuvastatin (CRESTOR) 10 MG tablet   Other Relevant Orders   Lipid panel   Prediabetes    Diet and nutrition discussed.  Hemoglobin A1c done today. Advised decrease amount of daily carbohydrate intake and daily calories and increase amount of plant-based protein in her diet.      Relevant Orders   Hemoglobin A1c   Patient Instructions  Health Maintenance After Age 54 After age 25, you are at a higher risk for certain long-term diseases and infections as well as injuries from falls. Falls are a major cause of broken bones and head injuries in people who are older than age 35. Getting regular preventive care can help to keep you healthy and well. Preventive care includes getting regular testing and making lifestyle changes as recommended by your health care provider. Talk with your health care provider about: Which screenings and tests you should have. A screening is a test that checks for a disease when you have no symptoms. A diet and exercise plan that is right for you. What should I know about screenings and tests to prevent falls? Screening and testing are the best ways to find a health problem early. Early diagnosis and treatment give you the best chance of managing medical conditions that are common after age 31. Certain conditions and lifestyle choices may make you more likely to have a fall. Your health care provider may recommend: Regular vision checks. Poor vision and conditions such as cataracts can make you more likely to have a fall. If you wear glasses, make sure to get your prescription updated if your vision changes. Medicine review. Work with your health care provider to regularly review all of the medicines you are taking, including over-the-counter medicines. Ask your health care provider about any side effects that may make you  more likely to have a fall. Tell your  health care provider if any medicines that you take make you feel dizzy or sleepy. Strength and balance checks. Your health care provider may recommend certain tests to check your strength and balance while standing, walking, or changing positions. Foot health exam. Foot pain and numbness, as well as not wearing proper footwear, can make you more likely to have a fall. Screenings, including: Osteoporosis screening. Osteoporosis is a condition that causes the bones to get weaker and break more easily. Blood pressure screening. Blood pressure changes and medicines to control blood pressure can make you feel dizzy. Depression screening. You may be more likely to have a fall if you have a fear of falling, feel depressed, or feel unable to do activities that you used to do. Alcohol use screening. Using too much alcohol can affect your balance and may make you more likely to have a fall. Follow these instructions at home: Lifestyle Do not drink alcohol if: Your health care provider tells you not to drink. If you drink alcohol: Limit how much you have to: 0-1 drink a day for women. 0-2 drinks a day for men. Know how much alcohol is in your drink. In the [place] one drink equals one 12 oz bottle of beer (355 mL), one 5 oz glass of wine (148 mL), or one 1 oz glass of hard liquor (44 mL). Do not use any products that contain nicotine or tobacco. These products include cigarettes, chewing tobacco, and vaping devices, such as e-cigarettes. If you need help quitting, ask your health care provider. Activity  Follow a regular exercise program to stay fit. This will help you maintain your balance. Ask your health care provider what types of exercise are appropriate for you. If you need a cane or walker, use it as recommended by your health care provider. Wear supportive shoes that have nonskid soles. Safety  Remove any tripping hazards, such as rugs, cords, and clutter. Install safety equipment such as grab  bars in bathrooms and safety rails on stairs. Keep rooms and walkways well-lit. General instructions Talk with your health care provider about your risks for falling. Tell your health care provider if: You fall. Be sure to tell your health care provider about all falls, even ones that seem minor. You feel dizzy, tiredness (fatigue), or off-balance. Take over-the-counter and prescription medicines only as told by your health care provider. These include supplements. Eat a healthy diet and maintain a healthy weight. A healthy diet includes low-fat dairy products, low-fat (lean) meats, and fiber from whole grains, beans, and lots of fruits and vegetables. Stay current with your vaccines. Schedule regular health, dental, and eye exams. Summary Having a healthy lifestyle and getting preventive care can help to protect your health and wellness after age 3. Screening and testing are the best way to find a health problem early and help you avoid having a fall. Early diagnosis and treatment give you the best chance for managing medical conditions that are more common for people who are older than age 53. Falls are a major cause of broken bones and head injuries in people who are older than age 60. Take precautions to prevent a fall at home. Work with your health care provider to learn what changes you can make to improve your health and wellness and to prevent falls. This information is not intended to replace advice given to you by your health care provider. Make sure you discuss any questions you have  with your health care provider. Document Revised: 12/03/2020 Document Reviewed: 12/03/2020 Elsevier Patient Education  [place], MD [facility]

## 2022-09-08 NOTE — Patient Instructions (Signed)
Health Maintenance After Age 67 After age 67, you are at a higher risk for certain long-term diseases and infections as well as injuries from falls. Falls are a major cause of broken bones and head injuries in people who are older than age 67. Getting regular preventive care can help to keep you healthy and well. Preventive care includes getting regular testing and making lifestyle changes as recommended by your health care provider. Talk with your health care provider about: Which screenings and tests you should have. A screening is a test that checks for a disease when you have no symptoms. A diet and exercise plan that is right for you. What should I know about screenings and tests to prevent falls? Screening and testing are the best ways to find a health problem early. Early diagnosis and treatment give you the best chance of managing medical conditions that are common after age 67. Certain conditions and lifestyle choices may make you more likely to have a fall. Your health care provider may recommend: Regular vision checks. Poor vision and conditions such as cataracts can make you more likely to have a fall. If you wear glasses, make sure to get your prescription updated if your vision changes. Medicine review. Work with your health care provider to regularly review all of the medicines you are taking, including over-the-counter medicines. Ask your health care provider about any side effects that may make you more likely to have a fall. Tell your health care provider if any medicines that you take make you feel dizzy or sleepy. Strength and balance checks. Your health care provider may recommend certain tests to check your strength and balance while standing, walking, or changing positions. Foot health exam. Foot pain and numbness, as well as not wearing proper footwear, can make you more likely to have a fall. Screenings, including: Osteoporosis screening. Osteoporosis is a condition that causes  the bones to get weaker and break more easily. Blood pressure screening. Blood pressure changes and medicines to control blood pressure can make you feel dizzy. Depression screening. You may be more likely to have a fall if you have a fear of falling, feel depressed, or feel unable to do activities that you used to do. Alcohol use screening. Using too much alcohol can affect your balance and may make you more likely to have a fall. Follow these instructions at home: Lifestyle Do not drink alcohol if: Your health care provider tells you not to drink. If you drink alcohol: Limit how much you have to: 0-1 drink a day for women. 0-2 drinks a day for men. Know how much alcohol is in your drink. In the [place] one drink equals one 12 oz bottle of beer (355 mL), one 5 oz glass of wine (148 mL), or one 1 oz glass of hard liquor (44 mL). Do not use any products that contain nicotine or tobacco. These products include cigarettes, chewing tobacco, and vaping devices, such as e-cigarettes. If you need help quitting, ask your health care provider. Activity  Follow a regular exercise program to stay fit. This will help you maintain your balance. Ask your health care provider what types of exercise are appropriate for you. If you need a cane or walker, use it as recommended by your health care provider. Wear supportive shoes that have nonskid soles. Safety  Remove any tripping hazards, such as rugs, cords, and clutter. Install safety equipment such as grab bars in bathrooms and safety rails on stairs. Keep rooms and walkways   well-lit. General instructions Talk with your health care provider about your risks for falling. Tell your health care provider if: You fall. Be sure to tell your health care provider about all falls, even ones that seem minor. You feel dizzy, tiredness (fatigue), or off-balance. Take over-the-counter and prescription medicines only as told by your health care provider. These include  supplements. Eat a healthy diet and maintain a healthy weight. A healthy diet includes low-fat dairy products, low-fat (lean) meats, and fiber from whole grains, beans, and lots of fruits and vegetables. Stay current with your vaccines. Schedule regular health, dental, and eye exams. Summary Having a healthy lifestyle and getting preventive care can help to protect your health and wellness after age 67. Screening and testing are the best way to find a health problem early and help you avoid having a fall. Early diagnosis and treatment give you the best chance for managing medical conditions that are more common for people who are older than age 67. Falls are a major cause of broken bones and head injuries in people who are older than age 67. Take precautions to prevent a fall at home. Work with your health care provider to learn what changes you can make to improve your health and wellness and to prevent falls. This information is not intended to replace advice given to you by your health care provider. Make sure you discuss any questions you have with your health care provider. Document Revised: 12/03/2020 Document Reviewed: 12/03/2020 Elsevier Patient Education  2023 Elsevier Inc.  

## 2022-09-08 NOTE — Assessment & Plan Note (Signed)
On chronic anticoagulation with Xarelto 20 mg daily.

## 2022-10-06 ENCOUNTER — Ambulatory Visit
Admission: RE | Admit: 2022-10-06 | Discharge: 2022-10-06 | Disposition: A | Discharge status: Home / Self Care | Payer: Medicare Other | Source: Ambulatory Visit | Attending: Emergency Medicine | Admitting: Emergency Medicine

## 2022-10-06 DIAGNOSIS — Z1231 Encounter for screening mammogram for malignant neoplasm of breast: Secondary | ICD-10-CM

## 2022-10-15 ENCOUNTER — Telehealth: Payer: Self-pay

## 2022-10-15 NOTE — Telephone Encounter (Signed)
Called patient to schedule Medicare Annual Wellness Visit (AWV). Left message for patient to call back and schedule Medicare Annual Wellness Visit (AWV).  Last date of AWV: Eligible for AWV-I as of 06/27/22  Please schedule an appointment at any time with [facility].   Norton Blizzard, [place] ([facility])  [facility] [phone]

## 2022-10-17 NOTE — Telephone Encounter (Signed)
Error

## 2023-01-19 ENCOUNTER — Other Ambulatory Visit: Payer: Self-pay | Admitting: Emergency Medicine

## 2023-01-19 DIAGNOSIS — I480 Paroxysmal atrial fibrillation: Secondary | ICD-10-CM

## 2023-02-16 ENCOUNTER — Ambulatory Visit: Payer: Medicare Other

## 2023-02-23 ENCOUNTER — Other Ambulatory Visit: Payer: Self-pay | Admitting: Emergency Medicine

## 2023-02-23 DIAGNOSIS — I480 Paroxysmal atrial fibrillation: Secondary | ICD-10-CM

## 2023-02-24 ENCOUNTER — Other Ambulatory Visit: Payer: Self-pay | Admitting: *Deleted

## 2023-02-24 DIAGNOSIS — I480 Paroxysmal atrial fibrillation: Secondary | ICD-10-CM

## 2023-02-24 MED ORDER — RIVAROXABAN 20 MG PO TABS: 20.0000 mg | ORAL_TABLET | Freq: Every day | ORAL | 0 refills | Status: DC

## 2023-03-09 ENCOUNTER — Ambulatory Visit: Payer: Medicare Other | Admitting: Emergency Medicine

## 2023-03-09 ENCOUNTER — Encounter: Payer: Self-pay | Admitting: Emergency Medicine

## 2023-03-09 VITALS — BP 132/76 | HR 71 | Temp 98.2°F | Ht 65.0 in | Wt 183.1 lb

## 2023-03-09 DIAGNOSIS — I4811 Longstanding persistent atrial fibrillation: Secondary | ICD-10-CM | POA: Diagnosis not present

## 2023-03-09 DIAGNOSIS — Z7901 Long term (current) use of anticoagulants: Secondary | ICD-10-CM | POA: Diagnosis not present

## 2023-03-09 DIAGNOSIS — I10 Essential (primary) hypertension: Secondary | ICD-10-CM

## 2023-03-09 DIAGNOSIS — Z13820 Encounter for screening for osteoporosis: Secondary | ICD-10-CM | POA: Diagnosis not present

## 2023-03-09 DIAGNOSIS — D6859 Other primary thrombophilia: Secondary | ICD-10-CM | POA: Diagnosis not present

## 2023-03-09 DIAGNOSIS — R7303 Prediabetes: Secondary | ICD-10-CM | POA: Diagnosis not present

## 2023-03-09 DIAGNOSIS — E785 Hyperlipidemia, unspecified: Secondary | ICD-10-CM | POA: Diagnosis not present

## 2023-03-09 LAB — CBC WITH DIFFERENTIAL/PLATELET
Basophils Absolute: 0.1 10*3/uL (ref 0.0–0.1)
Basophils Relative: 1.1 % (ref 0.0–3.0)
Eosinophils Absolute: 0.1 10*3/uL (ref 0.0–0.7)
Eosinophils Relative: 1.6 % (ref 0.0–5.0)
HCT: 37.9 % (ref 36.0–46.0)
Hemoglobin: 11.7 g/dL — ABNORMAL LOW (ref 12.0–15.0)
Lymphocytes Relative: 37.3 % (ref 12.0–46.0)
Lymphs Abs: 2.2 10*3/uL (ref 0.7–4.0)
MCHC: 30.9 g/dL (ref 30.0–36.0)
MCV: 78.8 fl (ref 78.0–100.0)
Monocytes Absolute: 0.6 10*3/uL (ref 0.1–1.0)
Monocytes Relative: 9.9 % (ref 3.0–12.0)
Neutro Abs: 2.9 10*3/uL (ref 1.4–7.7)
Neutrophils Relative %: 50.1 % (ref 43.0–77.0)
Platelets: 234 10*3/uL (ref 150.0–400.0)
RBC: 4.81 Mil/uL (ref 3.87–5.11)
RDW: 13.9 % (ref 11.5–15.5)
WBC: 5.8 10*3/uL (ref 4.0–10.5)

## 2023-03-09 LAB — COMPREHENSIVE METABOLIC PANEL
ALT: 20 U/L (ref 0–35)
AST: 22 U/L (ref 0–37)
Albumin: 4.2 g/dL (ref 3.5–5.2)
Alkaline Phosphatase: 54 U/L (ref 39–117)
BUN: 12 mg/dL (ref 6–23)
CO2: 27 mEq/L (ref 19–32)
Calcium: 9.3 mg/dL (ref 8.4–10.5)
Chloride: 99 mEq/L (ref 96–112)
Creatinine, Ser: 0.83 mg/dL (ref 0.40–1.20)
GFR: 73.35 mL/min (ref >60.00)
Glucose, Bld: 94 mg/dL (ref 70–99)
Potassium: 3.2 mEq/L — ABNORMAL LOW (ref 3.5–5.1)
Sodium: 135 mEq/L (ref 135–145)
Total Bilirubin: 0.5 mg/dL (ref 0.2–1.2)
Total Protein: 7.3 g/dL (ref 6.0–8.3)

## 2023-03-09 LAB — LIPID PANEL
Cholesterol: 174 mg/dL (ref 0–200)
HDL: 62.3 mg/dL (ref >39.00)
LDL Cholesterol: 96 mg/dL (ref 0–99)
NonHDL: 111.21
Total CHOL/HDL Ratio: 3
Triglycerides: 75 mg/dL (ref 0.0–149.0)
VLDL: 15 mg/dL (ref 0.0–40.0)

## 2023-03-09 LAB — HEMOGLOBIN A1C: Hgb A1c MFr Bld: 5.7 % (ref 4.6–6.5)

## 2023-03-09 NOTE — Assessment & Plan Note (Signed)
No clinical bleeding episodes. Fall precautions given Continues Xarelto 20 mg daily

## 2023-03-09 NOTE — Assessment & Plan Note (Signed)
Diet and nutrition discussed. Continues rosuvastatin 10 mg daily Lipid profile done today. The 10-year ASCVD risk score (Arnett DK, et al., 2019) is: 9.9%   Values used to calculate the score:     Age: 67 years     Sex: Female     Is Non-Hispanic African American: Yes     Diabetic: No     Tobacco smoker: No     Systolic Blood Pressure: 132 mmHg     Is BP treated: Yes     HDL Cholesterol: 70.3 mg/dL     Total Cholesterol: 199 mg/dL

## 2023-03-09 NOTE — Addendum Note (Signed)
Addended by: Evie Lacks on: 03/09/2023 11:47 AM   Modules accepted: Level of Service

## 2023-03-09 NOTE — Assessment & Plan Note (Signed)
At risk for cardiovascular events. Continues Xarelto 20 mg daily

## 2023-03-09 NOTE — Progress Notes (Signed)
Mandy Price 67 y.o.   Chief Complaint  Patient presents with   Medical Management of Chronic Issues    f/u appt, no concerns     HISTORY OF PRESENT ILLNESS: This is a 67 y.o. female here for 76-month follow-up of chronic medical conditions Sees cardiologist on a regular basis Doing well.  Has no complaints or medical concerns today No change in medications. Normal labs 6 months ago. BP Readings from Last 3 Encounters:  03/09/23 132/76  09/08/22 130/70  07/07/22 128/84   Wt Readings from Last 3 Encounters:  03/09/23 183 lb 2 oz (83.1 kg)  09/08/22 179 lb 4 oz (81.3 kg)  07/07/22 180 lb 12.8 oz (82 kg)     HPI   Prior to Admission medications   Medication Sig Start Date End Date Taking? Authorizing Provider  atenolol-chlorthalidone (TENORETIC) 100-25 MG tablet Take 1 tablet by mouth once daily 06/30/22  Yes Marnita Poirier, Eilleen Kempf, MD  flecainide (TAMBOCOR) 50 MG tablet Take 1 tablet (50 mg total) by mouth 2 (two) times daily. Per MD Return in about 6 months (around 03/09/2023) for Chronic medical problems. 01/19/23  Yes Rochanda Harpham, Eilleen Kempf, MD  OMEGA-3 KRILL OIL PO Take by mouth daily.   Yes [provider]  rivaroxaban (XARELTO) 20 MG TABS tablet Take 1 tablet (20 mg total) by mouth daily with supper. 02/24/23  Yes Katyra Tomassetti, Eilleen Kempf, MD  rosuvastatin (CRESTOR) 10 MG tablet Take 1 tablet (10 mg total) by mouth daily. 09/08/22  Yes Georgina Quint, MD    No Known Allergies  Patient Active Problem List   Diagnosis Date Noted   Dyslipidemia 08/26/2021   Prediabetes 08/26/2021   Current use of long term anticoagulation 02/04/2021   Thrombophilia (HCC) 02/04/2021   Atrial flutter (HCC) 02/10/2019   Atrial fibrillation (HCC) 03/09/2017   Obesity (BMI 30.0-34.9) 04/28/2012   Essential hypertension 01/22/2012    Past Medical History:  Diagnosis Date   A-fib (HCC)    Hyperlipidemia    Hypertension     Past Surgical History:  Procedure  Laterality Date   None Surgeries      Social History   Socioeconomic History   Marital status: Married    Spouse name: Not on file   Number of children: 4   Years of education: Not on file   Highest education level: Not on file  Occupational History   Not on file  Tobacco Use   Smoking status: Never   Smokeless tobacco: Never  Vaping Use   Vaping status: Never Used  Substance and Sexual Activity   Alcohol use: No   Drug use: No   Sexual activity: Yes    Partners: Male  Other Topics Concern   Not on file  Social History Narrative   Married   4 grown children   Restaurant Cook   Social Determinants of Health   Financial Resource Strain: Low Risk  (05/16/2019)   Overall Financial Resource Strain (CARDIA)    Difficulty of Paying Living Expenses: Not hard at all  Food Insecurity: No Food Insecurity (05/16/2019)   Hunger Vital Sign    Worried About Running Out of Food in the Last Year: Never true    Ran Out of Food in the Last Year: Never true  Transportation Needs: No Transportation Needs (05/16/2019)   PRAPARE - Administrator, Civil Service (Medical): No    Lack of Transportation (Non-Medical): No  Physical Activity: Insufficiently Active (05/16/2019)   Exercise Vital Sign  Days of Exercise per Week: 3 days    Minutes of Exercise per Session: 30 min  Stress: No Stress Concern Present (05/16/2019)   [employer] of Occupational Health - Occupational Stress Questionnaire    Feeling of Stress : Only a little  Social Connections: Unknown (05/16/2019)   Social Connection and Isolation Panel [NHANES]    Frequency of Communication with Friends and Family: Three times a week    Frequency of Social Gatherings with Friends and Family: Twice a week    Attends Religious Services: Patient declined    Database administrator or Organizations: Patient declined    Attends Banker Meetings: Patient declined    Marital Status: Married  Careers information officer Violence: Not At Risk (05/16/2019)   Humiliation, Afraid, Rape, and Kick questionnaire    Fear of Current or Ex-Partner: No    Emotionally Abused: No    Physically Abused: No    Sexually Abused: No    No family history on file.   Review of Systems  Constitutional: Negative.  Negative for chills and fever.  HENT: Negative.  Negative for congestion and sore throat.   Respiratory: Negative.  Negative for cough and shortness of breath.   Cardiovascular: Negative.  Negative for chest pain and palpitations.  Gastrointestinal:  Negative for abdominal pain, nausea and vomiting.  Genitourinary: Negative.  Negative for dysuria and hematuria.  Skin: Negative.  Negative for rash.  Neurological: Negative.  Negative for dizziness and headaches.  All other systems reviewed and are negative.   Vitals:   03/09/23 0805  BP: 132/76  Pulse: 71  Temp: 98.2 F (36.8 C)  SpO2: 98%    Physical Exam Vitals reviewed.  Constitutional:      Appearance: Normal appearance.  HENT:     Head: Normocephalic.     Mouth/Throat:     Mouth: Mucous membranes are moist.     Pharynx: Oropharynx is clear.  Eyes:     Extraocular Movements: Extraocular movements intact.     Conjunctiva/sclera: Conjunctivae normal.     Pupils: Pupils are equal, round, and reactive to light.  Cardiovascular:     Rate and Rhythm: Normal rate and regular rhythm.     Pulses: Normal pulses.     Heart sounds: Normal heart sounds.  Pulmonary:     Effort: Pulmonary effort is normal.     Breath sounds: Normal breath sounds.  Musculoskeletal:     Cervical back: No tenderness.  Lymphadenopathy:     Cervical: No cervical adenopathy.  Skin:    General: Skin is warm and dry.     Capillary Refill: Capillary refill takes less than 2 seconds.  Neurological:     General: No focal deficit present.     Mental Status: She is alert and oriented to person, place, and time.  Psychiatric:        Mood and Affect: Mood normal.         Behavior: Behavior normal.      ASSESSMENT & PLAN: A total of 45 minutes was spent with the patient and counseling/coordination of care regarding preparing for this visit, review of most recent office visit notes, review of multiple chronic medical conditions under management, review of all medications, review of most recent blood work results, cardiovascular risks associated with hypertension and chronic atrial fibrillation, education on nutrition, review of health maintenance items, prognosis, documentation and need for follow-up.  Problem List Items Addressed This Visit       Cardiovascular and  Mediastinum   Essential hypertension - Primary    BP Readings from Last 3 Encounters:  03/09/23 132/76  09/08/22 130/70  07/07/22 128/84  Well-controlled hypertension Continue Tenoretic 100-25 mg daily Cardiovascular risks associated with hypertension discussed Dietary approaches to stop hypertension discussed       Relevant Orders   Comprehensive metabolic panel   Atrial fibrillation (HCC)    Presently normal sinus rhythm with well-controlled rate. Continues flecainide 50 mg twice a day along with Tenoretic 100-25 mg daily Continues Xarelto 20 mg daily        Hematopoietic and Hemostatic   Thrombophilia (HCC)    At risk for cardiovascular events. Continues Xarelto 20 mg daily        Other   Current use of long term anticoagulation    No clinical bleeding episodes. Fall precautions given Continues Xarelto 20 mg daily      Relevant Orders   CBC with Differential/Platelet   Dyslipidemia    Diet and nutrition discussed. Continues rosuvastatin 10 mg daily Lipid profile done today. The 10-year ASCVD risk score (Arnett DK, et al., 2019) is: 9.9%   Values used to calculate the score:     Age: 47 years     Sex: Female     Is Non-Hispanic African American: Yes     Diabetic: No     Tobacco smoker: No     Systolic Blood Pressure: 132 mmHg     Is BP treated: Yes     HDL  Cholesterol: 70.3 mg/dL     Total Cholesterol: 199 mg/dL       Relevant Orders   Lipid panel   Prediabetes    Hemoglobin A1c done today Diet and nutrition discussed. Advised to decrease amount of daily carbohydrate intake and daily calories and increase amount of plant-based protein in her diet      Relevant Orders   Hemoglobin A1c   Other Visit Diagnoses     Osteoporosis screening       Relevant Orders   DG Bone Density      Patient Instructions  Hypertension, Adult High blood pressure (hypertension) is when the force of blood pumping through the arteries is too strong. The arteries are the blood vessels that carry blood from the heart throughout the body. Hypertension forces the heart to work harder to pump blood and may cause arteries to become narrow or stiff. Untreated or uncontrolled hypertension can lead to a heart attack, heart failure, a stroke, kidney disease, and other problems. A blood pressure reading consists of a higher number over a lower number. Ideally, your blood pressure should be below 120/80. The first ("top") number is called the systolic pressure. It is a measure of the pressure in your arteries as your heart beats. The second ("bottom") number is called the diastolic pressure. It is a measure of the pressure in your arteries as the heart relaxes. What are the causes? The exact cause of this condition is not known. There are some conditions that result in high blood pressure. What increases the risk? Certain factors may make you more likely to develop high blood pressure. Some of these risk factors are under your control, including: Smoking. Not getting enough exercise or physical activity. Being overweight. Having too much fat, sugar, calories, or salt (sodium) in your diet. Drinking too much alcohol. Other risk factors include: Having a personal history of heart disease, diabetes, high cholesterol, or kidney disease. Stress. Having a family history of  high blood pressure and  high cholesterol. Having obstructive sleep apnea. Age. The risk increases with age. What are the signs or symptoms? High blood pressure may not cause symptoms. Very high blood pressure (hypertensive crisis) may cause: Headache. Fast or irregular heartbeats (palpitations). Shortness of breath. Nosebleed. Nausea and vomiting. Vision changes. Severe chest pain, dizziness, and seizures. How is this diagnosed? This condition is diagnosed by measuring your blood pressure while you are seated, with your arm resting on a flat surface, your legs uncrossed, and your feet flat on the floor. The cuff of the blood pressure monitor will be placed directly against the skin of your upper arm at the level of your heart. Blood pressure should be measured at least twice using the same arm. Certain conditions can cause a difference in blood pressure between your right and left arms. If you have a high blood pressure reading during one visit or you have normal blood pressure with other risk factors, you may be asked to: Return on a different day to have your blood pressure checked again. Monitor your blood pressure at home for 1 week or longer. If you are diagnosed with hypertension, you may have other blood or imaging tests to help your health care provider understand your overall risk for other conditions. How is this treated? This condition is treated by making healthy lifestyle changes, such as eating healthy foods, exercising more, and reducing your alcohol intake. You may be referred for counseling on a healthy diet and physical activity. Your health care provider may prescribe medicine if lifestyle changes are not enough to get your blood pressure under control and if: Your systolic blood pressure is above 130. Your diastolic blood pressure is above 80. Your personal target blood pressure may vary depending on your medical conditions, your age, and other factors. Follow these  instructions at home: Eating and drinking  Eat a diet that is high in fiber and potassium, and low in sodium, added sugar, and fat. An example of this eating plan is called the DASH diet. DASH stands for Dietary Approaches to Stop Hypertension. To eat this way: Eat plenty of fresh fruits and vegetables. Try to fill one half of your plate at each meal with fruits and vegetables. Eat whole grains, such as whole-wheat pasta, brown rice, or whole-grain bread. Fill about one fourth of your plate with whole grains. Eat or drink low-fat dairy products, such as skim milk or low-fat yogurt. Avoid fatty cuts of meat, processed or cured meats, and poultry with skin. Fill about one fourth of your plate with lean proteins, such as fish, chicken without skin, beans, eggs, or tofu. Avoid pre-made and processed foods. These tend to be higher in sodium, added sugar, and fat. Reduce your daily sodium intake. Many people with hypertension should eat less than 1,500 mg of sodium a day. Do not drink alcohol if: Your health care provider tells you not to drink. You are pregnant, may be pregnant, or are planning to become pregnant. If you drink alcohol: Limit how much you have to: 0-1 drink a day for women. 0-2 drinks a day for men. Know how much alcohol is in your drink. In the [place] one drink equals one 12 oz bottle of beer (355 mL), one 5 oz glass of wine (148 mL), or one 1 oz glass of hard liquor (44 mL). Lifestyle  Work with your health care provider to maintain a healthy body weight or to lose weight. Ask what an ideal weight is for you. Get at least  30 minutes of exercise that causes your heart to beat faster (aerobic exercise) most days of the week. Activities may include walking, swimming, or biking. Include exercise to strengthen your muscles (resistance exercise), such as Pilates or lifting weights, as part of your weekly exercise routine. Try to do these types of exercises for 30 minutes at least 3 days  a week. Do not use any products that contain nicotine or tobacco. These products include cigarettes, chewing tobacco, and vaping devices, such as e-cigarettes. If you need help quitting, ask your health care provider. Monitor your blood pressure at home as told by your health care provider. Keep all follow-up visits. This is important. Medicines Take over-the-counter and prescription medicines only as told by your health care provider. Follow directions carefully. Blood pressure medicines must be taken as prescribed. Do not skip doses of blood pressure medicine. Doing this puts you at risk for problems and can make the medicine less effective. Ask your health care provider about side effects or reactions to medicines that you should watch for. Contact a health care provider if you: Think you are having a reaction to a medicine you are taking. Have headaches that keep coming back (recurring). Feel dizzy. Have swelling in your ankles. Have trouble with your vision. Get help right away if you: Develop a severe headache or confusion. Have unusual weakness or numbness. Feel faint. Have severe pain in your chest or abdomen. Vomit repeatedly. Have trouble breathing. These symptoms may be an emergency. Get help right away. Call 911. Do not wait to see if the symptoms will go away. Do not drive yourself to the hospital. Summary Hypertension is when the force of blood pumping through your arteries is too strong. If this condition is not controlled, it may put you at risk for serious complications. Your personal target blood pressure may vary depending on your medical conditions, your age, and other factors. For most people, a normal blood pressure is less than 120/80. Hypertension is treated with lifestyle changes, medicines, or a combination of both. Lifestyle changes include losing weight, eating a healthy, low-sodium diet, exercising more, and limiting alcohol. This information is not intended  to replace advice given to you by your health care provider. Make sure you discuss any questions you have with your health care provider. Document Revised: 05/21/2021 Document Reviewed: 05/21/2021 Elsevier Patient Education  2024 Elsevier Inc.      Edwina Barth, MD [facility]

## 2023-03-09 NOTE — Assessment & Plan Note (Signed)
Presently normal sinus rhythm with well-controlled rate. Continues flecainide 50 mg twice a day along with Tenoretic 100-25 mg daily Continues Xarelto 20 mg daily

## 2023-03-09 NOTE — Assessment & Plan Note (Signed)
Hemoglobin A1c done today Diet and nutrition discussed. Advised to decrease amount of daily carbohydrate intake and daily calories and increase amount of plant-based protein in her diet

## 2023-03-09 NOTE — Assessment & Plan Note (Signed)
BP Readings from Last 3 Encounters:  03/09/23 132/76  09/08/22 130/70  07/07/22 128/84  Well-controlled hypertension Continue Tenoretic 100-25 mg daily Cardiovascular risks associated with hypertension discussed Dietary approaches to stop hypertension discussed

## 2023-03-09 NOTE — Patient Instructions (Signed)
Hypertension, Adult High blood pressure (hypertension) is when the force of blood pumping through the arteries is too strong. The arteries are the blood vessels that carry blood from the heart throughout the body. Hypertension forces the heart to work harder to pump blood and may cause arteries to become narrow or stiff. Untreated or uncontrolled hypertension can lead to a heart attack, heart failure, a stroke, kidney disease, and other problems. A blood pressure reading consists of a higher number over a lower number. Ideally, your blood pressure should be below 120/80. The first ("top") number is called the systolic pressure. It is a measure of the pressure in your arteries as your heart beats. The second ("bottom") number is called the diastolic pressure. It is a measure of the pressure in your arteries as the heart relaxes. What are the causes? The exact cause of this condition is not known. There are some conditions that result in high blood pressure. What increases the risk? Certain factors may make you more likely to develop high blood pressure. Some of these risk factors are under your control, including: Smoking. Not getting enough exercise or physical activity. Being overweight. Having too much fat, sugar, calories, or salt (sodium) in your diet. Drinking too much alcohol. Other risk factors include: Having a personal history of heart disease, diabetes, high cholesterol, or kidney disease. Stress. Having a family history of high blood pressure and high cholesterol. Having obstructive sleep apnea. Age. The risk increases with age. What are the signs or symptoms? High blood pressure may not cause symptoms. Very high blood pressure (hypertensive crisis) may cause: Headache. Fast or irregular heartbeats (palpitations). Shortness of breath. Nosebleed. Nausea and vomiting. Vision changes. Severe chest pain, dizziness, and seizures. How is this diagnosed? This condition is diagnosed by  measuring your blood pressure while you are seated, with your arm resting on a flat surface, your legs uncrossed, and your feet flat on the floor. The cuff of the blood pressure monitor will be placed directly against the skin of your upper arm at the level of your heart. Blood pressure should be measured at least twice using the same arm. Certain conditions can cause a difference in blood pressure between your right and left arms. If you have a high blood pressure reading during one visit or you have normal blood pressure with other risk factors, you may be asked to: Return on a different day to have your blood pressure checked again. Monitor your blood pressure at home for 1 week or longer. If you are diagnosed with hypertension, you may have other blood or imaging tests to help your health care provider understand your overall risk for other conditions. How is this treated? This condition is treated by making healthy lifestyle changes, such as eating healthy foods, exercising more, and reducing your alcohol intake. You may be referred for counseling on a healthy diet and physical activity. Your health care provider may prescribe medicine if lifestyle changes are not enough to get your blood pressure under control and if: Your systolic blood pressure is above 130. Your diastolic blood pressure is above 80. Your personal target blood pressure may vary depending on your medical conditions, your age, and other factors. Follow these instructions at home: Eating and drinking  Eat a diet that is high in fiber and potassium, and low in sodium, added sugar, and fat. An example of this eating plan is called the DASH diet. DASH stands for Dietary Approaches to Stop Hypertension. To eat this way: Eat   plenty of fresh fruits and vegetables. Try to fill one half of your plate at each meal with fruits and vegetables. Eat whole grains, such as whole-wheat pasta, brown rice, or whole-grain bread. Fill about one  fourth of your plate with whole grains. Eat or drink low-fat dairy products, such as skim milk or low-fat yogurt. Avoid fatty cuts of meat, processed or cured meats, and poultry with skin. Fill about one fourth of your plate with lean proteins, such as fish, chicken without skin, beans, eggs, or tofu. Avoid pre-made and processed foods. These tend to be higher in sodium, added sugar, and fat. Reduce your daily sodium intake. Many people with hypertension should eat less than 1,500 mg of sodium a day. Do not drink alcohol if: Your health care provider tells you not to drink. You are pregnant, may be pregnant, or are planning to become pregnant. If you drink alcohol: Limit how much you have to: 0-1 drink a day for women. 0-2 drinks a day for men. Know how much alcohol is in your drink. In the [place] one drink equals one 12 oz bottle of beer (355 mL), one 5 oz glass of wine (148 mL), or one 1 oz glass of hard liquor (44 mL). Lifestyle  Work with your health care provider to maintain a healthy body weight or to lose weight. Ask what an ideal weight is for you. Get at least 30 minutes of exercise that causes your heart to beat faster (aerobic exercise) most days of the week. Activities may include walking, swimming, or biking. Include exercise to strengthen your muscles (resistance exercise), such as Pilates or lifting weights, as part of your weekly exercise routine. Try to do these types of exercises for 30 minutes at least 3 days a week. Do not use any products that contain nicotine or tobacco. These products include cigarettes, chewing tobacco, and vaping devices, such as e-cigarettes. If you need help quitting, ask your health care provider. Monitor your blood pressure at home as told by your health care provider. Keep all follow-up visits. This is important. Medicines Take over-the-counter and prescription medicines only as told by your health care provider. Follow directions carefully. Blood  pressure medicines must be taken as prescribed. Do not skip doses of blood pressure medicine. Doing this puts you at risk for problems and can make the medicine less effective. Ask your health care provider about side effects or reactions to medicines that you should watch for. Contact a health care provider if you: Think you are having a reaction to a medicine you are taking. Have headaches that keep coming back (recurring). Feel dizzy. Have swelling in your ankles. Have trouble with your vision. Get help right away if you: Develop a severe headache or confusion. Have unusual weakness or numbness. Feel faint. Have severe pain in your chest or abdomen. Vomit repeatedly. Have trouble breathing. These symptoms may be an emergency. Get help right away. Call 911. Do not wait to see if the symptoms will go away. Do not drive yourself to the hospital. Summary Hypertension is when the force of blood pumping through your arteries is too strong. If this condition is not controlled, it may put you at risk for serious complications. Your personal target blood pressure may vary depending on your medical conditions, your age, and other factors. For most people, a normal blood pressure is less than 120/80. Hypertension is treated with lifestyle changes, medicines, or a combination of both. Lifestyle changes include losing weight, eating a healthy,   low-sodium diet, exercising more, and limiting alcohol. This information is not intended to replace advice given to you by your health care provider. Make sure you discuss any questions you have with your health care provider. Document Revised: 05/21/2021 Document Reviewed: 05/21/2021 Elsevier Patient Education  2024 [employer]  

## 2023-04-27 ENCOUNTER — Other Ambulatory Visit: Payer: Self-pay | Admitting: Emergency Medicine

## 2023-04-27 DIAGNOSIS — I480 Paroxysmal atrial fibrillation: Secondary | ICD-10-CM

## 2023-07-02 ENCOUNTER — Other Ambulatory Visit: Payer: Self-pay | Admitting: Emergency Medicine

## 2023-07-02 DIAGNOSIS — I480 Paroxysmal atrial fibrillation: Secondary | ICD-10-CM

## 2023-07-06 ENCOUNTER — Ambulatory Visit: Payer: Medicare Other | Attending: Internal Medicine | Admitting: Cardiology

## 2023-07-06 ENCOUNTER — Encounter: Payer: Self-pay | Admitting: Cardiology

## 2023-07-06 VITALS — BP 140/82 | HR 68 | Resp 16 | Ht 65.0 in | Wt 183.0 lb

## 2023-07-06 DIAGNOSIS — I480 Paroxysmal atrial fibrillation: Secondary | ICD-10-CM

## 2023-07-06 DIAGNOSIS — E782 Mixed hyperlipidemia: Secondary | ICD-10-CM | POA: Insufficient documentation

## 2023-07-06 DIAGNOSIS — I10 Essential (primary) hypertension: Secondary | ICD-10-CM

## 2023-07-06 MED ORDER — RIVAROXABAN 20 MG PO TABS: 20.0000 mg | ORAL_TABLET | Freq: Every day | ORAL | 3 refills | Status: DC

## 2023-07-06 MED ORDER — FLECAINIDE ACETATE 50 MG PO TABS: 50.0000 mg | ORAL_TABLET | Freq: Two times a day (BID) | ORAL | 3 refills | Status: AC

## 2023-07-06 NOTE — Patient Instructions (Signed)
Medication Instructions:  Your physician recommends that you continue on your current medications as directed. Please refer to the Current Medication list given to you today.  *If you need a refill on your cardiac medications before your next appointment, please call your pharmacy*  Lab Work: None ordered today.  Testing/Procedures: Your physician has requested that you have an exercise tolerance test. For further information please visit [email or web address] Please also follow instruction sheet, as given.  Your physician has requested that you have an echocardiogram. Echocardiography is a painless test that uses sound waves to create images of your heart. It provides your doctor with information about the size and shape of your heart and how well your heart's chambers and valves are working. This procedure takes approximately one hour. There are no restrictions for this procedure. Please do NOT wear cologne, perfume, aftershave, or lotions (deodorant is allowed). Please arrive 15 minutes prior to your appointment time.  Please note: We ask at that you not bring children with you during ultrasound (echo/ vascular) testing. Due to room size and safety concerns, children are not allowed in the ultrasound rooms during exams. Our front office staff cannot provide observation of children in our lobby area while testing is being conducted. An adult accompanying a patient to their appointment will only be allowed in the ultrasound room at the discretion of the ultrasound technician under special circumstances. We apologize for any inconvenience.  Follow-Up: At [facility], you and your health needs are our priority.  As part of our continuing mission to provide you with exceptional heart care, we have created designated Provider Care Teams.  These Care Teams include your primary Cardiologist (physician) and Advanced Practice Providers (APPs -  Physician Assistants and Nurse Practitioners) who all work  together to provide you with the care you need, when you need it.  We recommend signing up for the patient portal called "MyChart".  Sign up information is provided on this After Visit Summary.  MyChart is used to connect with patients for Virtual Visits (Telemedicine).  Patients are able to view lab/test results, encounter notes, upcoming appointments, etc.  Non-urgent messages can be sent to your provider as well.   To learn more about what you can do with MyChart, go to [email or web address].    Your next appointment:   1 year(s)  The format for your next appointment:   In Person  Provider:   Elder Negus, MD {  Other Instructions Exercise Tolerance Test  Please arrive 15 minutes prior to your appointment time for registration and insurance purposes.  The test will take approximately 45 minutes to complete.  How to prepare for your Exercise Stress Test: Do bring a list of your current medications with you.  If not listed below, you may take your medications as normal. Do wear comfortable clothes (no dresses or overalls) and walking shoes, tennis shoes preferred (no heels or open toed shoes are allowed) Do Not wear cologne, perfume, aftershave or lotions (deodorant is allowed). Please report to [address] for your test.  If these instructions are not followed, your test will have to be rescheduled.  If you have questions or concerns about your appointment, you can call the Stress Lab at [phone].  If you cannot keep your appointment, please provide 24 hours notification to the Stress Lab, to avoid a possible $50 charge to your account.

## 2023-07-06 NOTE — Progress Notes (Signed)
Cardiology Office Note:  .   Date:  07/06/2023  ID:  Mandy Price, DOB [birth year], MRN [id number] PCP: Georgina Quint, MD   HeartCare Providers Cardiologist:  Truett Mainland, MD PCP: Georgina Quint, MD  Chief Complaint  Patient presents with   PAF      History of Present Illness: .    Mandy Price is a 67 y.o. 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 She stays on her feet at work but does not do any other regular exercise. She denies chest pain, shortness of breath, palpitations, leg edema, orthopnea, PND, TIA/syncope.  Blood pressure elevated on first check, improved on second check.     Vitals:   07/06/23 1105 07/06/23 1139  BP: (!) 152/74 (!) 140/82  Pulse: 68   Resp: 16   SpO2: 94%      ROS:  Review of Systems  Cardiovascular:  Negative for chest pain, dyspnea on exertion, leg swelling, palpitations and syncope.     Studies Reviewed: Marland Kitchen        EKG 07/06/2023: Normal sinus rhythm Nonspecific T wave abnormality No previous ECGs available    Independently interpreted 02/2023: Chol 174, TG 75, HDL 62, LDL 96 HbA1C 5.7% Hb 11.7 Cr 0.8 TSH 1.2  Risk Assessment/Calculations:    CHA2DS2-VASc Score =  3 T This indicates a 3.2 % annual risk of stroke.      Physical Exam:   Physical Exam Vitals and nursing note reviewed.  Constitutional:      General: She is not in acute distress. Neck:     Vascular: No JVD.  Cardiovascular:     Rate and Rhythm: Normal rate and regular rhythm.     Heart sounds: Normal heart sounds. No murmur heard. Pulmonary:     Effort: Pulmonary effort is normal.     Breath sounds: Normal breath sounds. No wheezing or rales.  Musculoskeletal:     Right lower leg: No edema.     Left lower leg: No edema.      VISIT DIAGNOSES:   ICD-10-CM   1. Paroxysmal atrial fibrillation (HCC)  I48.0 EKG 12-Lead    ECHOCARDIOGRAM COMPLETE     EXERCISE TOLERANCE TEST (ETT)    flecainide (TAMBOCOR) 50 MG tablet    rivaroxaban (XARELTO) 20 MG TABS tablet    Cardiac Stress Test: Informed Consent Details: Physician/Practitioner Attestation; Transcribe to consent form and obtain patient signature    2. Essential hypertension  I10     3. Mixed hyperlipidemia  E78.2        ASSESSMENT AND PLAN: .    Mandy Price is a 67 y.o. female with hypertension, hyperlipidemia, paroxysmal A-fib  Paroxysmal Afib: In sinus rhythm today. Continue flecainide 50 mg bid, along with atenolol-chlorthalidone. CHA2DS2-VASc Score =  3 T This indicates a 3.2 % annual risk of stroke.  Continue Xarelto 20 mg daily.  Refilled Flecainide and Xarelto today. Given ongoing use of Flecainide, will obtain exercise treadmill stress test and echocardiogram.   Hypertension: Slightly elevated today, generally well controlled. No change made today. She has f/u w/PCP in February. If BP consistently remains >140/80 mmHg, could consider adding ARB-given her Afib.   Mixed hyperlipidemia: Well controlled,  Meds ordered this encounter  Medications   flecainide (TAMBOCOR) 50 MG tablet    Sig: Take 1 tablet (50 mg total) by mouth 2 (two) times daily.    Dispense:  180 tablet    Refill:  3  rivaroxaban (XARELTO) 20 MG TABS tablet    Sig: Take 1 tablet (20 mg total) by mouth daily with supper.    Dispense:  90 tablet    Refill:  3     F/u in 1 year  Signed, Elder Negus, MD

## 2023-07-08 ENCOUNTER — Ambulatory Visit: Payer: Medicare Other | Admitting: Internal Medicine

## 2023-07-21 ENCOUNTER — Other Ambulatory Visit: Payer: Self-pay | Admitting: Emergency Medicine

## 2023-07-21 DIAGNOSIS — I10 Essential (primary) hypertension: Secondary | ICD-10-CM

## 2023-08-20 DIAGNOSIS — H52223 Regular astigmatism, bilateral: Secondary | ICD-10-CM | POA: Diagnosis not present

## 2023-08-20 DIAGNOSIS — H5203 Hypermetropia, bilateral: Secondary | ICD-10-CM | POA: Diagnosis not present

## 2023-08-20 DIAGNOSIS — H2513 Age-related nuclear cataract, bilateral: Secondary | ICD-10-CM | POA: Diagnosis not present

## 2023-08-20 DIAGNOSIS — H524 Presbyopia: Secondary | ICD-10-CM | POA: Diagnosis not present

## 2023-09-07 ENCOUNTER — Encounter: Payer: Self-pay | Admitting: Emergency Medicine

## 2023-09-07 ENCOUNTER — Ambulatory Visit (INDEPENDENT_AMBULATORY_CARE_PROVIDER_SITE_OTHER): Payer: Medicare Other | Admitting: Emergency Medicine

## 2023-09-07 VITALS — BP 126/84 | HR 101 | Temp 98.3°F | Ht 65.0 in | Wt 182.0 lb

## 2023-09-07 DIAGNOSIS — D6859 Other primary thrombophilia: Secondary | ICD-10-CM | POA: Diagnosis not present

## 2023-09-07 DIAGNOSIS — E785 Hyperlipidemia, unspecified: Secondary | ICD-10-CM

## 2023-09-07 DIAGNOSIS — Z7901 Long term (current) use of anticoagulants: Secondary | ICD-10-CM

## 2023-09-07 DIAGNOSIS — I10 Essential (primary) hypertension: Secondary | ICD-10-CM

## 2023-09-07 DIAGNOSIS — I480 Paroxysmal atrial fibrillation: Secondary | ICD-10-CM | POA: Diagnosis not present

## 2023-09-07 DIAGNOSIS — R7303 Prediabetes: Secondary | ICD-10-CM

## 2023-09-07 LAB — COMPREHENSIVE METABOLIC PANEL
ALT: 23 U/L (ref 0–35)
AST: 23 U/L (ref 0–37)
Albumin: 4.3 g/dL (ref 3.5–5.2)
Alkaline Phosphatase: 71 U/L (ref 39–117)
BUN: 16 mg/dL (ref 6–23)
CO2: 27 meq/L (ref 19–32)
Calcium: 9.4 mg/dL (ref 8.4–10.5)
Chloride: 101 meq/L (ref 96–112)
Creatinine, Ser: 0.92 mg/dL (ref 0.40–1.20)
GFR: 64.6 mL/min (ref >60.00)
Glucose, Bld: 89 mg/dL (ref 70–99)
Potassium: 3.9 meq/L (ref 3.5–5.1)
Sodium: 137 meq/L (ref 135–145)
Total Bilirubin: 0.5 mg/dL (ref 0.2–1.2)
Total Protein: 8 g/dL (ref 6.0–8.3)

## 2023-09-07 LAB — CBC WITH DIFFERENTIAL/PLATELET
Basophils Absolute: 0.1 10*3/uL (ref 0.0–0.1)
Basophils Relative: 1.2 % (ref 0.0–3.0)
Eosinophils Absolute: 0.1 10*3/uL (ref 0.0–0.7)
Eosinophils Relative: 1.5 % (ref 0.0–5.0)
HCT: 43.9 % (ref 36.0–46.0)
Hemoglobin: 14 g/dL (ref 12.0–15.0)
Lymphocytes Relative: 56.3 % — ABNORMAL HIGH (ref 12.0–46.0)
Lymphs Abs: 3.5 10*3/uL (ref 0.7–4.0)
MCHC: 31.8 g/dL (ref 30.0–36.0)
MCV: 79.9 fL (ref 78.0–100.0)
Monocytes Absolute: 0.5 10*3/uL (ref 0.1–1.0)
Monocytes Relative: 8.7 % (ref 3.0–12.0)
Neutro Abs: 2 10*3/uL (ref 1.4–7.7)
Neutrophils Relative %: 32.3 % — ABNORMAL LOW (ref 43.0–77.0)
Platelets: 224 10*3/uL (ref 150.0–400.0)
RBC: 5.49 Mil/uL — ABNORMAL HIGH (ref 3.87–5.11)
RDW: 13.8 % (ref 11.5–15.5)
WBC: 6.3 10*3/uL (ref 4.0–10.5)

## 2023-09-07 LAB — LIPID PANEL
Cholesterol: 218 mg/dL — ABNORMAL HIGH (ref 0–200)
HDL: 75.2 mg/dL (ref >39.00)
LDL Cholesterol: 121 mg/dL — ABNORMAL HIGH (ref 0–99)
NonHDL: 142.42
Total CHOL/HDL Ratio: 3
Triglycerides: 105 mg/dL (ref 0.0–149.0)
VLDL: 21 mg/dL (ref 0.0–40.0)

## 2023-09-07 LAB — HEMOGLOBIN A1C: Hgb A1c MFr Bld: 5.7 % (ref 4.6–6.5)

## 2023-09-07 NOTE — Assessment & Plan Note (Signed)
 Diet and nutrition discussed Continues rosuvastatin 10 mg daily Lipid profile done today

## 2023-09-07 NOTE — Assessment & Plan Note (Signed)
 Presently in A-fib with controlled ventricular rate Continues Tenoretic  100-25 mg daily and flecainide  50 mg twice a day Also continues daily Xarelto  20 mg Has appointment to see cardiologist next month

## 2023-09-07 NOTE — Assessment & Plan Note (Signed)
Hemoglobin A1c done today Diet and nutrition discussed. Advised to decrease amount of daily carbohydrate intake and daily calories and increase amount of plant-based protein in her diet

## 2023-09-07 NOTE — Assessment & Plan Note (Signed)
At risk for cardiovascular events. Continues Xarelto 20 mg daily

## 2023-09-07 NOTE — Assessment & Plan Note (Signed)
No clinical bleeding episodes. Fall precautions given Continues Xarelto 20 mg daily

## 2023-09-07 NOTE — Patient Instructions (Signed)
 Hypertension, Adult High blood pressure (hypertension) is when the force of blood pumping through the arteries is too strong. The arteries are the blood vessels that carry blood from the heart throughout the body. Hypertension forces the heart to work harder to pump blood and may cause arteries to become narrow or stiff. Untreated or uncontrolled hypertension can lead to a heart attack, heart failure, a stroke, kidney disease, and other problems. A blood pressure reading consists of a higher number over a lower number. Ideally, your blood pressure should be below 120/80. The first ("top") number is called the systolic pressure. It is a measure of the pressure in your arteries as your heart beats. The second ("bottom") number is called the diastolic pressure. It is a measure of the pressure in your arteries as the heart relaxes. What are the causes? The exact cause of this condition is not known. There are some conditions that result in high blood pressure. What increases the risk? Certain factors may make you more likely to develop high blood pressure. Some of these risk factors are under your control, including: Smoking. Not getting enough exercise or physical activity. Being overweight. Having too much fat, sugar, calories, or salt (sodium) in your diet. Drinking too much alcohol. Other risk factors include: Having a personal history of heart disease, diabetes, high cholesterol, or kidney disease. Stress. Having a family history of high blood pressure and high cholesterol. Having obstructive sleep apnea. Age. The risk increases with age. What are the signs or symptoms? High blood pressure may not cause symptoms. Very high blood pressure (hypertensive crisis) may cause: Headache. Fast or irregular heartbeats (palpitations). Shortness of breath. Nosebleed. Nausea and vomiting. Vision changes. Severe chest pain, dizziness, and seizures. How is this diagnosed? This condition is diagnosed by  measuring your blood pressure while you are seated, with your arm resting on a flat surface, your legs uncrossed, and your feet flat on the floor. The cuff of the blood pressure monitor will be placed directly against the skin of your upper arm at the level of your heart. Blood pressure should be measured at least twice using the same arm. Certain conditions can cause a difference in blood pressure between your right and left arms. If you have a high blood pressure reading during one visit or you have normal blood pressure with other risk factors, you may be asked to: Return on a different day to have your blood pressure checked again. Monitor your blood pressure at home for 1 week or longer. If you are diagnosed with hypertension, you may have other blood or imaging tests to help your health care provider understand your overall risk for other conditions. How is this treated? This condition is treated by making healthy lifestyle changes, such as eating healthy foods, exercising more, and reducing your alcohol intake. You may be referred for counseling on a healthy diet and physical activity. Your health care provider may prescribe medicine if lifestyle changes are not enough to get your blood pressure under control and if: Your systolic blood pressure is above 130. Your diastolic blood pressure is above 80. Your personal target blood pressure may vary depending on your medical conditions, your age, and other factors. Follow these instructions at home: Eating and drinking  Eat a diet that is high in fiber and potassium, and low in sodium, added sugar, and fat. An example of this eating plan is called the DASH diet. DASH stands for Dietary Approaches to Stop Hypertension. To eat this way: Eat  plenty of fresh fruits and vegetables. Try to fill one half of your plate at each meal with fruits and vegetables. Eat whole grains, such as whole-wheat pasta, brown rice, or whole-grain bread. Fill about one  fourth of your plate with whole grains. Eat or drink low-fat dairy products, such as skim milk or low-fat yogurt. Avoid fatty cuts of meat, processed or cured meats, and poultry with skin. Fill about one fourth of your plate with lean proteins, such as fish, chicken without skin, beans, eggs, or tofu. Avoid pre-made and processed foods. These tend to be higher in sodium, added sugar, and fat. Reduce your daily sodium intake. Many people with hypertension should eat less than 1,500 mg of sodium a day. Do not drink alcohol if: Your health care provider tells you not to drink. You are pregnant, may be pregnant, or are planning to become pregnant. If you drink alcohol: Limit how much you have to: 0-1 drink a day for women. 0-2 drinks a day for men. Know how much alcohol is in your drink. In the [place] one drink equals one 12 oz bottle of beer (355 mL), one 5 oz glass of wine (148 mL), or one 1 oz glass of hard liquor (44 mL). Lifestyle  Work with your health care provider to maintain a healthy body weight or to lose weight. Ask what an ideal weight is for you. Get at least 30 minutes of exercise that causes your heart to beat faster (aerobic exercise) most days of the week. Activities may include walking, swimming, or biking. Include exercise to strengthen your muscles (resistance exercise), such as Pilates or lifting weights, as part of your weekly exercise routine. Try to do these types of exercises for 30 minutes at least 3 days a week. Do not use any products that contain nicotine or tobacco. These products include cigarettes, chewing tobacco, and vaping devices, such as e-cigarettes. If you need help quitting, ask your health care provider. Monitor your blood pressure at home as told by your health care provider. Keep all follow-up visits. This is important. Medicines Take over-the-counter and prescription medicines only as told by your health care provider. Follow directions carefully. Blood  pressure medicines must be taken as prescribed. Do not skip doses of blood pressure medicine. Doing this puts you at risk for problems and can make the medicine less effective. Ask your health care provider about side effects or reactions to medicines that you should watch for. Contact a health care provider if you: Think you are having a reaction to a medicine you are taking. Have headaches that keep coming back (recurring). Feel dizzy. Have swelling in your ankles. Have trouble with your vision. Get help right away if you: Develop a severe headache or confusion. Have unusual weakness or numbness. Feel faint. Have severe pain in your chest or abdomen. Vomit repeatedly. Have trouble breathing. These symptoms may be an emergency. Get help right away. Call 911. Do not wait to see if the symptoms will go away. Do not drive yourself to the hospital. Summary Hypertension is when the force of blood pumping through your arteries is too strong. If this condition is not controlled, it may put you at risk for serious complications. Your personal target blood pressure may vary depending on your medical conditions, your age, and other factors. For most people, a normal blood pressure is less than 120/80. Hypertension is treated with lifestyle changes, medicines, or a combination of both. Lifestyle changes include losing weight, eating a healthy,  low-sodium diet, exercising more, and limiting alcohol. This information is not intended to replace advice given to you by your health care provider. Make sure you discuss any questions you have with your health care provider. Document Revised: 05/21/2021 Document Reviewed: 05/21/2021 Elsevier Patient Education  2024 [employer].

## 2023-09-07 NOTE — Assessment & Plan Note (Signed)
Well-controlled hypertension. Continue Tenoretic 100-25 mg daily. Cardiovascular risks associated with hypertension discussed. Dietary approaches to stop hypertension discussed.

## 2023-09-07 NOTE — Progress Notes (Signed)
 Mandy Price 68 y.o.   Chief Complaint  Patient presents with   Follow-up    6 month f/u HTN    HISTORY OF PRESENT ILLNESS: This is a 68 y.o. female here for 61-month follow-up of hypertension Overall doing well.  Has no complaints or medical concerns today  HPI   Prior to Admission medications   Medication Sig Start Date End Date Taking? Authorizing Provider  atenolol -chlorthalidone  (TENORETIC ) 100-25 MG tablet Take 1 tablet by mouth once daily 07/21/23  Yes Darivs Lunden, Isidro Margo, MD  flecainide  (TAMBOCOR ) 50 MG tablet Take 1 tablet (50 mg total) by mouth 2 (two) times daily. 07/06/23  Yes Patwardhan, Manish J, MD  OMEGA-3 KRILL OIL PO Take by mouth daily.   Yes [provider]  rivaroxaban  (XARELTO ) 20 MG TABS tablet Take 1 tablet (20 mg total) by mouth daily with supper. 07/06/23  Yes Patwardhan, Manish J, MD  rosuvastatin  (CRESTOR ) 10 MG tablet Take 1 tablet (10 mg total) by mouth daily. 09/08/22  Yes Elvira Hammersmith, MD    No Known Allergies  Patient Active Problem List   Diagnosis Date Noted   Mixed hyperlipidemia 07/06/2023   Dyslipidemia 08/26/2021   Prediabetes 08/26/2021   Current use of long term anticoagulation 02/04/2021   Thrombophilia (HCC) 02/04/2021   Atrial flutter (HCC) 02/10/2019   Atrial fibrillation (HCC) 03/09/2017   Obesity (BMI 30.0-34.9) 04/28/2012   Essential hypertension 01/22/2012    Past Medical History:  Diagnosis Date   A-fib (HCC)    Hyperlipidemia    Hypertension     Past Surgical History:  Procedure Laterality Date   None Surgeries      Social History   Socioeconomic History   Marital status: Married    Spouse name: Not on file   Number of children: 4   Years of education: Not on file   Highest education level: Not on file  Occupational History   Not on file  Tobacco Use   Smoking status: Never   Smokeless tobacco: Never  Vaping Use   Vaping status: Never Used  Substance and Sexual Activity   Alcohol  use: No   Drug use: No   Sexual activity: Yes    Partners: Male  Other Topics Concern   Not on file  Social History Narrative   Married   4 grown children   Restaurant Cook   Social Drivers of Health   Financial Resource Strain: Low Risk  (05/16/2019)   Overall Financial Resource Strain (CARDIA)    Difficulty of Paying Living Expenses: Not hard at all  Food Insecurity: No Food Insecurity (05/16/2019)   Hunger Vital Sign    Worried About Running Out of Food in the Last Year: Never true    Ran Out of Food in the Last Year: Never true  Transportation Needs: No Transportation Needs (05/16/2019)   PRAPARE - Administrator, Civil Service (Medical): No    Lack of Transportation (Non-Medical): No  Physical Activity: Insufficiently Active (05/16/2019)   Exercise Vital Sign    Days of Exercise per Week: 3 days    Minutes of Exercise per Session: 30 min  Stress: No Stress Concern Present (05/16/2019)   [employer] of Occupational Health - Occupational Stress Questionnaire    Feeling of Stress : Only a little  Social Connections: Unknown (05/16/2019)   Social Connection and Isolation Panel [NHANES]    Frequency of Communication with Friends and Family: Three times a week    Frequency of  Social Gatherings with Friends and Family: Twice a week    Attends Religious Services: Patient declined    Database administrator or Organizations: Patient declined    Attends Banker Meetings: Patient declined    Marital Status: Married  Catering manager Violence: Not At Risk (05/16/2019)   Humiliation, Afraid, Rape, and Kick questionnaire    Fear of Current or Ex-Partner: No    Emotionally Abused: No    Physically Abused: No    Sexually Abused: No    No family history on file.   Review of Systems  Constitutional: Negative.  Negative for chills and fever.  HENT: Negative.  Negative for congestion and sore throat.   Respiratory: Negative.  Negative for cough and  shortness of breath.   Cardiovascular: Negative.  Negative for chest pain and palpitations.  Gastrointestinal:  Negative for abdominal pain, diarrhea, nausea and vomiting.  Genitourinary: Negative.  Negative for dysuria and hematuria.  Skin: Negative.  Negative for rash.  Neurological: Negative.  Negative for dizziness and headaches.  All other systems reviewed and are negative.   Today's Vitals   09/07/23 0801  BP: 126/84  Pulse: (!) 101  Temp: 98.3 F (36.8 C)  TempSrc: Oral  SpO2: 99%  Weight: 182 lb (82.6 kg)  Height: 5\' 5"  (1.651 m)   Body mass index is 30.29 kg/m.   Physical Exam Vitals reviewed.  Constitutional:      Appearance: Normal appearance.  HENT:     Head: Normocephalic.     Mouth/Throat:     Mouth: Mucous membranes are moist.     Pharynx: Oropharynx is clear.  Eyes:     Extraocular Movements: Extraocular movements intact.     Pupils: Pupils are equal, round, and reactive to light.  Cardiovascular:     Rate and Rhythm: Normal rate. Rhythm irregular.     Pulses: Normal pulses.     Heart sounds: Normal heart sounds.  Pulmonary:     Effort: Pulmonary effort is normal.     Breath sounds: Normal breath sounds.  Abdominal:     Palpations: Abdomen is soft.     Tenderness: There is no abdominal tenderness.  Musculoskeletal:     Cervical back: No tenderness.  Lymphadenopathy:     Cervical: No cervical adenopathy.  Skin:    General: Skin is warm and dry.     Capillary Refill: Capillary refill takes less than 2 seconds.  Neurological:     General: No focal deficit present.     Mental Status: She is alert and oriented to person, place, and time.  Psychiatric:        Mood and Affect: Mood normal.        Behavior: Behavior normal.      ASSESSMENT & PLAN: A total of 45 minutes was spent with the patient and counseling/coordination of care regarding preparing for this visit, review of most recent office visit notes, review of multiple chronic medical  conditions and their management, review of all medications, review of most recent bloodwork results, review of health maintenance items, education on nutrition, prognosis, documentation, and need for follow up.   Problem List Items Addressed This Visit       Cardiovascular and Mediastinum   Essential hypertension - Primary   Well-controlled hypertension Continue Tenoretic  100-25 mg daily Cardiovascular risks associated with hypertension discussed Dietary approaches to stop hypertension discussed      Relevant Orders   CBC with Differential/Platelet   Comprehensive metabolic panel  Hemoglobin A1c   Lipid panel   Atrial fibrillation (HCC)   Presently in A-fib with controlled ventricular rate Continues Tenoretic  100-25 mg daily and flecainide  50 mg twice a day Also continues daily Xarelto  20 mg Has appointment to see cardiologist next month      Relevant Orders   CBC with Differential/Platelet     Hematopoietic and Hemostatic   Thrombophilia (HCC)   At risk for cardiovascular events. Continues Xarelto  20 mg daily      Relevant Orders   CBC with Differential/Platelet     Other   Current use of long term anticoagulation   No clinical bleeding episodes. Fall precautions given Continues Xarelto  20 mg daily      Relevant Orders   CBC with Differential/Platelet   Dyslipidemia   Diet and nutrition discussed. Continues rosuvastatin  10 mg daily Lipid profile done today.      Relevant Orders   Comprehensive metabolic panel   Lipid panel   Prediabetes   Hemoglobin A1c done today Diet and nutrition discussed. Advised to decrease amount of daily carbohydrate intake and daily calories and increase amount of plant-based protein in her diet      Relevant Orders   Hemoglobin A1c   Patient Instructions  Hypertension, Adult High blood pressure (hypertension) is when the force of blood pumping through the arteries is too strong. The arteries are the blood vessels that  carry blood from the heart throughout the body. Hypertension forces the heart to work harder to pump blood and may cause arteries to become narrow or stiff. Untreated or uncontrolled hypertension can lead to a heart attack, heart failure, a stroke, kidney disease, and other problems. A blood pressure reading consists of a higher number over a lower number. Ideally, your blood pressure should be below 120/80. The first ("top") number is called the systolic pressure. It is a measure of the pressure in your arteries as your heart beats. The second ("bottom") number is called the diastolic pressure. It is a measure of the pressure in your arteries as the heart relaxes. What are the causes? The exact cause of this condition is not known. There are some conditions that result in high blood pressure. What increases the risk? Certain factors may make you more likely to develop high blood pressure. Some of these risk factors are under your control, including: Smoking. Not getting enough exercise or physical activity. Being overweight. Having too much fat, sugar, calories, or salt (sodium) in your diet. Drinking too much alcohol. Other risk factors include: Having a personal history of heart disease, diabetes, high cholesterol, or kidney disease. Stress. Having a family history of high blood pressure and high cholesterol. Having obstructive sleep apnea. Age. The risk increases with age. What are the signs or symptoms? High blood pressure may not cause symptoms. Very high blood pressure (hypertensive crisis) may cause: Headache. Fast or irregular heartbeats (palpitations). Shortness of breath. Nosebleed. Nausea and vomiting. Vision changes. Severe chest pain, dizziness, and seizures. How is this diagnosed? This condition is diagnosed by measuring your blood pressure while you are seated, with your arm resting on a flat surface, your legs uncrossed, and your feet flat on the floor. The cuff of the  blood pressure monitor will be placed directly against the skin of your upper arm at the level of your heart. Blood pressure should be measured at least twice using the same arm. Certain conditions can cause a difference in blood pressure between your right and left arms. If you have  a high blood pressure reading during one visit or you have normal blood pressure with other risk factors, you may be asked to: Return on a different day to have your blood pressure checked again. Monitor your blood pressure at home for 1 week or longer. If you are diagnosed with hypertension, you may have other blood or imaging tests to help your health care provider understand your overall risk for other conditions. How is this treated? This condition is treated by making healthy lifestyle changes, such as eating healthy foods, exercising more, and reducing your alcohol intake. You may be referred for counseling on a healthy diet and physical activity. Your health care provider may prescribe medicine if lifestyle changes are not enough to get your blood pressure under control and if: Your systolic blood pressure is above 130. Your diastolic blood pressure is above 80. Your personal target blood pressure may vary depending on your medical conditions, your age, and other factors. Follow these instructions at home: Eating and drinking  Eat a diet that is high in fiber and potassium, and low in sodium, added sugar, and fat. An example of this eating plan is called the DASH diet. DASH stands for Dietary Approaches to Stop Hypertension. To eat this way: Eat plenty of fresh fruits and vegetables. Try to fill one half of your plate at each meal with fruits and vegetables. Eat whole grains, such as whole-wheat pasta, brown rice, or whole-grain bread. Fill about one fourth of your plate with whole grains. Eat or drink low-fat dairy products, such as skim milk or low-fat yogurt. Avoid fatty cuts of meat, processed or cured  meats, and poultry with skin. Fill about one fourth of your plate with lean proteins, such as fish, chicken without skin, beans, eggs, or tofu. Avoid pre-made and processed foods. These tend to be higher in sodium, added sugar, and fat. Reduce your daily sodium intake. Many people with hypertension should eat less than 1,500 mg of sodium a day. Do not drink alcohol if: Your health care provider tells you not to drink. You are pregnant, may be pregnant, or are planning to become pregnant. If you drink alcohol: Limit how much you have to: 0-1 drink a day for women. 0-2 drinks a day for men. Know how much alcohol is in your drink. In the [place] one drink equals one 12 oz bottle of beer (355 mL), one 5 oz glass of wine (148 mL), or one 1 oz glass of hard liquor (44 mL). Lifestyle  Work with your health care provider to maintain a healthy body weight or to lose weight. Ask what an ideal weight is for you. Get at least 30 minutes of exercise that causes your heart to beat faster (aerobic exercise) most days of the week. Activities may include walking, swimming, or biking. Include exercise to strengthen your muscles (resistance exercise), such as Pilates or lifting weights, as part of your weekly exercise routine. Try to do these types of exercises for 30 minutes at least 3 days a week. Do not use any products that contain nicotine or tobacco. These products include cigarettes, chewing tobacco, and vaping devices, such as e-cigarettes. If you need help quitting, ask your health care provider. Monitor your blood pressure at home as told by your health care provider. Keep all follow-up visits. This is important. Medicines Take over-the-counter and prescription medicines only as told by your health care provider. Follow directions carefully. Blood pressure medicines must be taken as prescribed. Do not skip doses  of blood pressure medicine. Doing this puts you at risk for problems and can make the medicine  less effective. Ask your health care provider about side effects or reactions to medicines that you should watch for. Contact a health care provider if you: Think you are having a reaction to a medicine you are taking. Have headaches that keep coming back (recurring). Feel dizzy. Have swelling in your ankles. Have trouble with your vision. Get help right away if you: Develop a severe headache or confusion. Have unusual weakness or numbness. Feel faint. Have severe pain in your chest or abdomen. Vomit repeatedly. Have trouble breathing. These symptoms may be an emergency. Get help right away. Call 911. Do not wait to see if the symptoms will go away. Do not drive yourself to the hospital. Summary Hypertension is when the force of blood pumping through your arteries is too strong. If this condition is not controlled, it may put you at risk for serious complications. Your personal target blood pressure may vary depending on your medical conditions, your age, and other factors. For most people, a normal blood pressure is less than 120/80. Hypertension is treated with lifestyle changes, medicines, or a combination of both. Lifestyle changes include losing weight, eating a healthy, low-sodium diet, exercising more, and limiting alcohol. This information is not intended to replace advice given to you by your health care provider. Make sure you discuss any questions you have with your health care provider. Document Revised: 05/21/2021 Document Reviewed: 05/21/2021 Elsevier Patient Education  2024 Elsevier Inc.     Maryagnes Small, MD [facility]

## 2023-10-04 ENCOUNTER — Other Ambulatory Visit: Payer: Self-pay | Admitting: Emergency Medicine

## 2023-10-04 DIAGNOSIS — I10 Essential (primary) hypertension: Secondary | ICD-10-CM

## 2023-10-04 DIAGNOSIS — E785 Hyperlipidemia, unspecified: Secondary | ICD-10-CM

## 2023-10-12 ENCOUNTER — Encounter (HOSPITAL_COMMUNITY): Payer: Medicare Other

## 2023-10-12 ENCOUNTER — Ambulatory Visit (HOSPITAL_COMMUNITY): Payer: Medicare Other | Attending: Cardiology

## 2023-10-12 DIAGNOSIS — I480 Paroxysmal atrial fibrillation: Secondary | ICD-10-CM | POA: Diagnosis not present

## 2023-10-12 LAB — ECHOCARDIOGRAM COMPLETE
Area-P 1/2: 5.75 cm2
S' Lateral: 2.8 cm

## 2023-10-13 ENCOUNTER — Encounter: Payer: Self-pay | Admitting: Cardiology

## 2023-10-14 IMAGING — MG MM DIGITAL SCREENING BILAT W/ TOMO AND CAD
8 series · 8 of 24 positions shown · non-contrast
Comparison: Previous exam(s).

CLINICAL DATA: Screening.

EXAM:
DIGITAL SCREENING BILATERAL MAMMOGRAM WITH TOMOSYNTHESIS AND CAD
TECHNIQUE: Bilateral screening digital craniocaudal and mediolateral oblique
mammograms were obtained. Bilateral screening digital breast
tomosynthesis was performed. The images were evaluated with
computer-aided detection.

[L CC synth-2D]
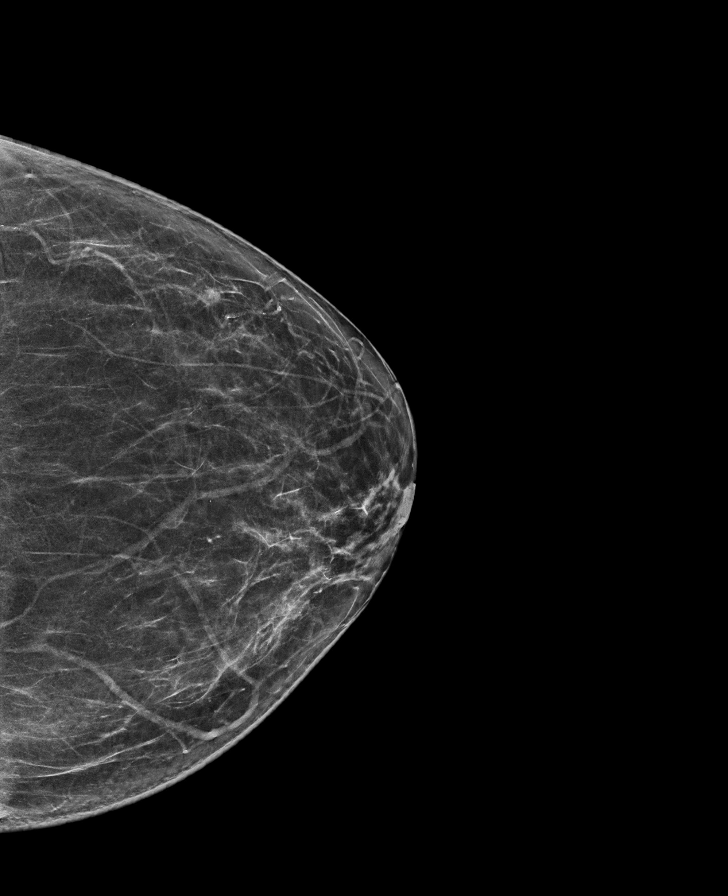

[R CC synth-2D]
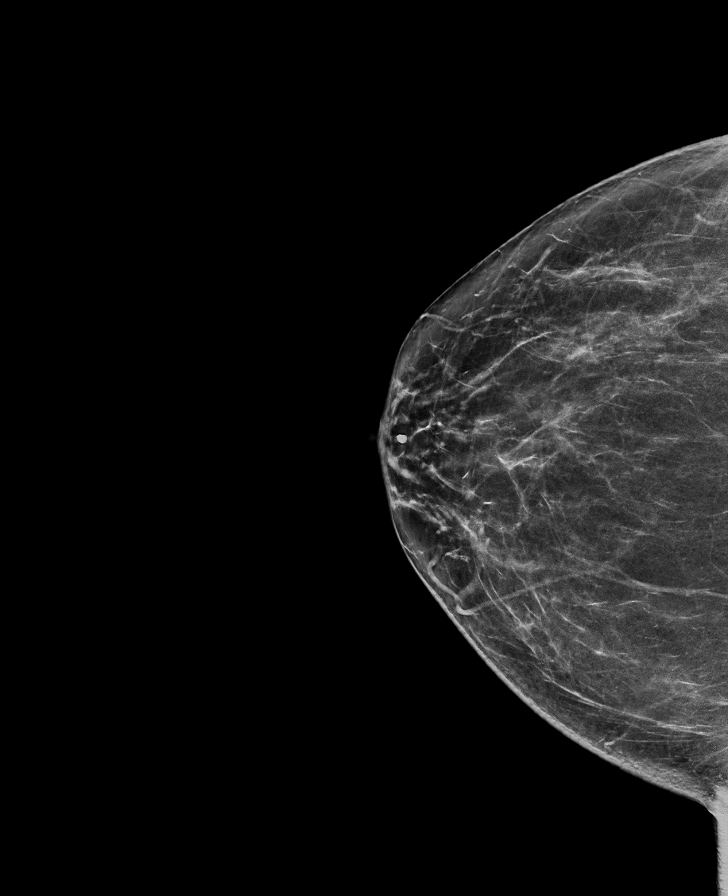

[R MLO synth-2D]
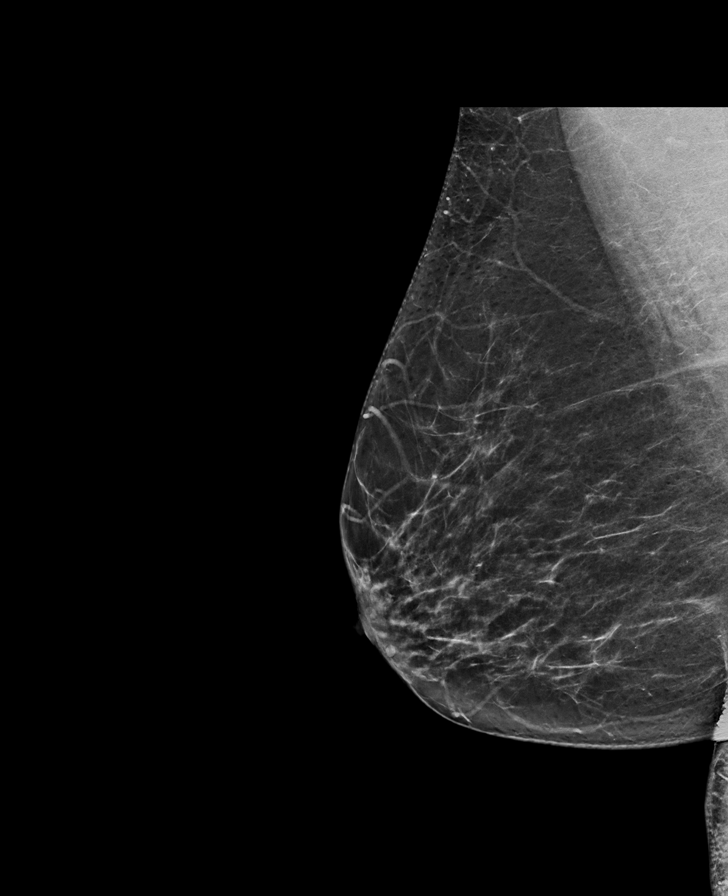

[L MLO synth-2D]
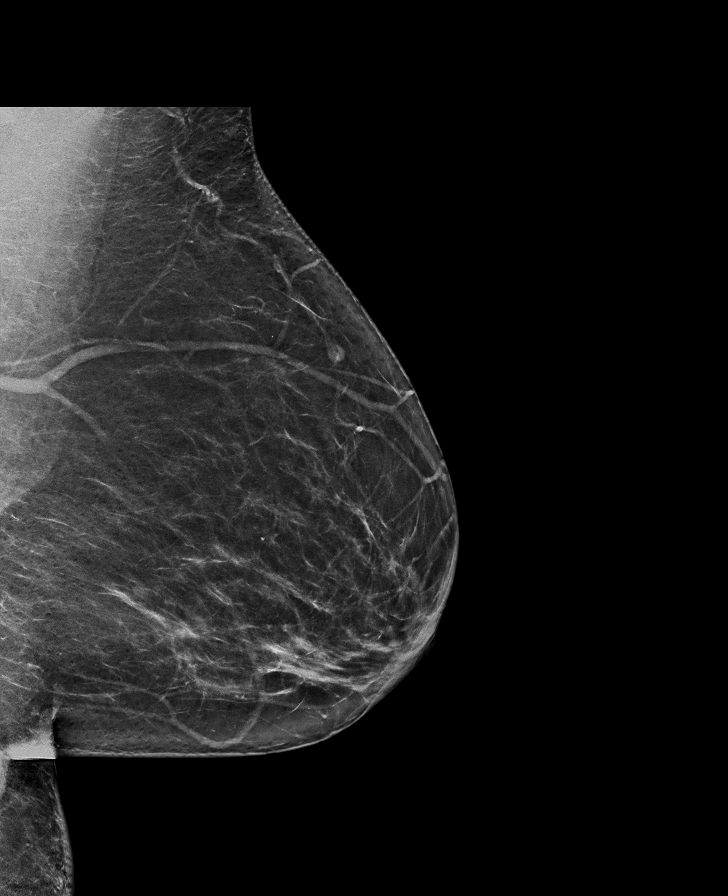

[R MLO tomo · tomo slice 39/76.0]
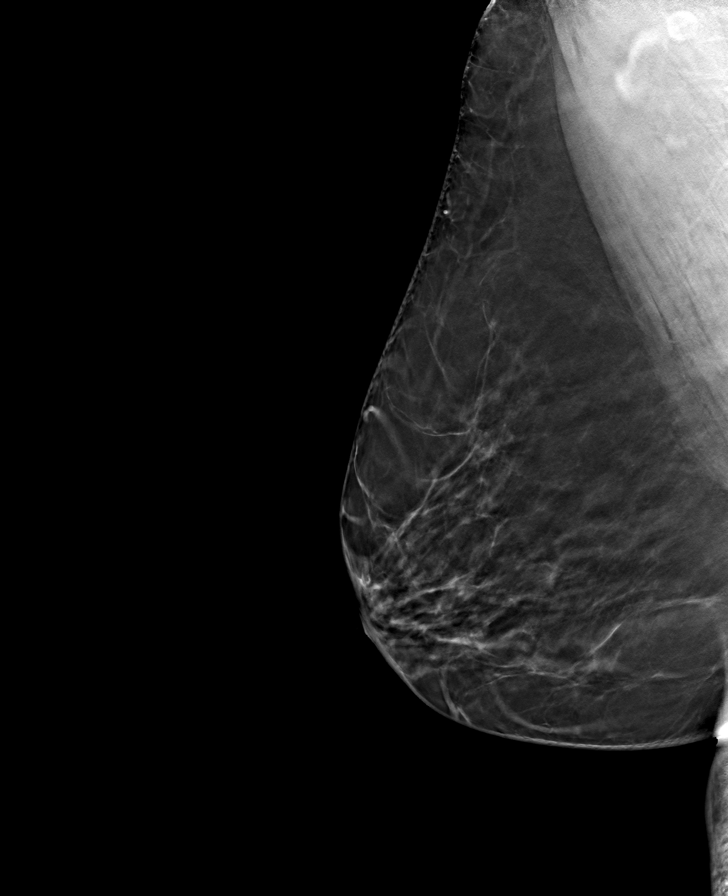

[L CC tomo · tomo slice 37/73.0]
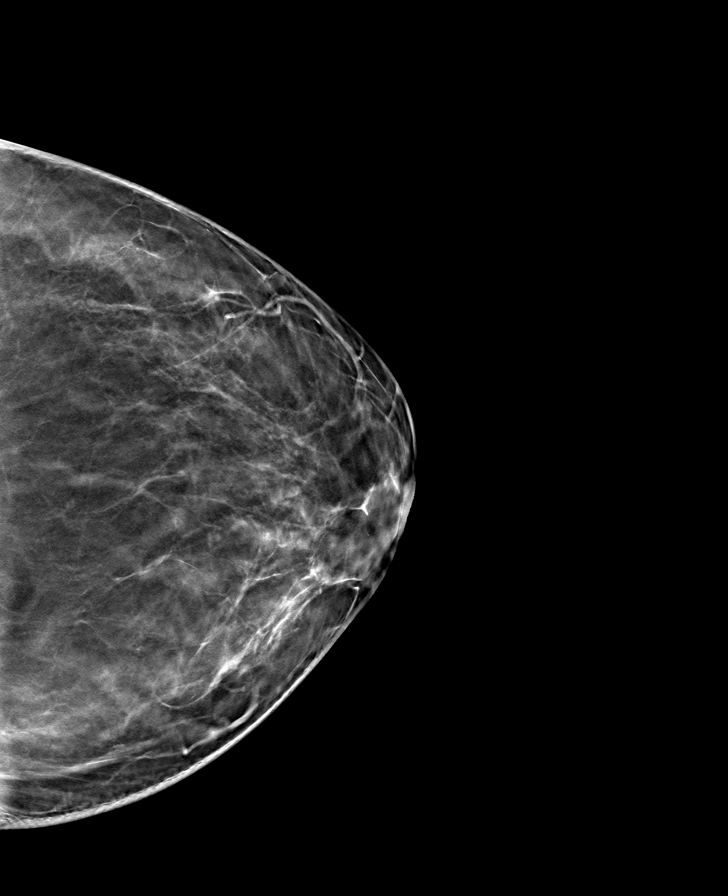

[R CC tomo · tomo slice 37/73.0]
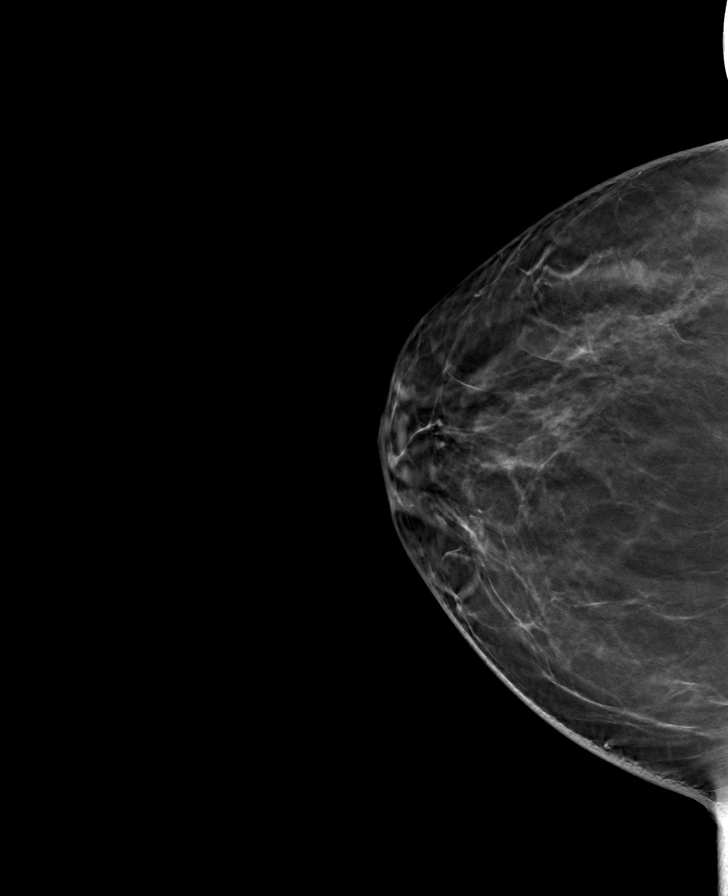

[L MLO tomo · tomo slice 39/77.0]
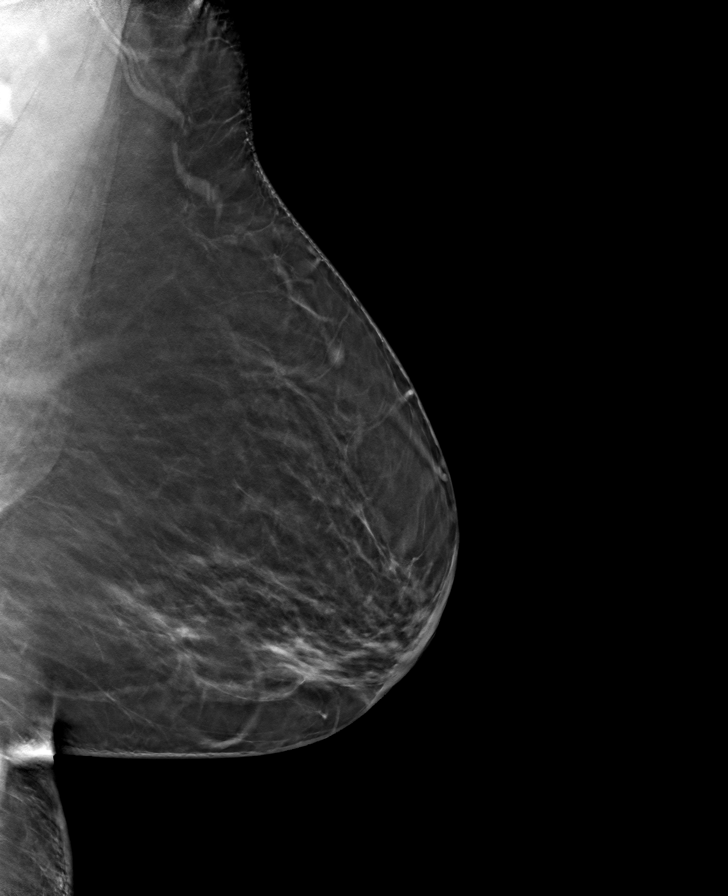

[8 of 24 positions shown; findings below may reference images not displayed]

ACR Breast Density Category b: There are scattered areas of
fibroglandular density.
FINDINGS: There are no findings suspicious for malignancy.
IMPRESSION: No mammographic evidence of malignancy. A result letter of this
screening mammogram will be mailed directly to the patient.

RECOMMENDATION:
Screening mammogram in one year. (Code:51-O-LD2)

BI-RADS CATEGORY  1: Negative.

## 2023-10-27 ENCOUNTER — Other Ambulatory Visit: Payer: Self-pay | Admitting: Emergency Medicine

## 2023-10-27 DIAGNOSIS — Z1231 Encounter for screening mammogram for malignant neoplasm of breast: Secondary | ICD-10-CM

## 2023-11-06 ENCOUNTER — Ambulatory Visit
Admission: RE | Admit: 2023-11-06 | Discharge: 2023-11-06 | Disposition: A | Discharge status: Home / Self Care | Source: Ambulatory Visit | Attending: Emergency Medicine | Admitting: Emergency Medicine

## 2023-11-06 DIAGNOSIS — Z1231 Encounter for screening mammogram for malignant neoplasm of breast: Secondary | ICD-10-CM | POA: Diagnosis not present

## 2023-11-10 ENCOUNTER — Encounter: Payer: Self-pay | Admitting: Emergency Medicine

## 2023-12-24 ENCOUNTER — Encounter: Payer: Self-pay | Admitting: Cardiology

## 2023-12-28 ENCOUNTER — Other Ambulatory Visit: Payer: Self-pay | Admitting: Emergency Medicine

## 2023-12-28 DIAGNOSIS — I10 Essential (primary) hypertension: Secondary | ICD-10-CM

## 2023-12-31 ENCOUNTER — Other Ambulatory Visit: Payer: Self-pay | Admitting: Emergency Medicine

## 2023-12-31 DIAGNOSIS — E785 Hyperlipidemia, unspecified: Secondary | ICD-10-CM

## 2024-03-04 ENCOUNTER — Encounter (HOSPITAL_COMMUNITY): Payer: Self-pay | Admitting: Cardiology

## 2024-03-07 ENCOUNTER — Ambulatory Visit: Payer: Medicare Other | Admitting: Emergency Medicine

## 2024-03-07 ENCOUNTER — Encounter: Payer: Self-pay | Admitting: Emergency Medicine

## 2024-03-07 VITALS — BP 126/74 | HR 75 | Temp 97.6°F | Ht 65.0 in | Wt 183.0 lb

## 2024-03-07 DIAGNOSIS — E785 Hyperlipidemia, unspecified: Secondary | ICD-10-CM | POA: Diagnosis not present

## 2024-03-07 DIAGNOSIS — R7303 Prediabetes: Secondary | ICD-10-CM

## 2024-03-07 DIAGNOSIS — Z7901 Long term (current) use of anticoagulants: Secondary | ICD-10-CM | POA: Diagnosis not present

## 2024-03-07 DIAGNOSIS — I480 Paroxysmal atrial fibrillation: Secondary | ICD-10-CM | POA: Diagnosis not present

## 2024-03-07 DIAGNOSIS — I10 Essential (primary) hypertension: Secondary | ICD-10-CM | POA: Diagnosis not present

## 2024-03-07 NOTE — Assessment & Plan Note (Signed)
 BP Readings from Last 3 Encounters:  03/07/24 126/74  09/07/23 126/84  07/06/23 (!) 140/82  Well-controlled hypertension Continue Tenoretic  100-25 mg daily Cardiovascular risks associated with hypertension discussed Dietary approaches to stop hypertension discussed

## 2024-03-07 NOTE — Assessment & Plan Note (Signed)
No clinical bleeding episodes. Fall precautions given Continues Xarelto 20 mg daily

## 2024-03-07 NOTE — Assessment & Plan Note (Addendum)
 Presently in sinus rhythm with good ventricular response Continues Tenoretic  100-25 mg daily and flecainide  50 mg twice a day Also continues daily Xarelto  20 mg Recent cardiologist visit office visit notes reviewed Recent echocardiogram report reviewed. Normal pumping function of the heart. No severe heart valve abnormalities noted.

## 2024-03-07 NOTE — Progress Notes (Signed)
 Mandy Price 68 y.o.   Chief Complaint  Patient presents with   Medical Management of Chronic Issues    6 month f/u     HISTORY OF PRESENT ILLNESS: This is a 68 y.o. female here for 54-month follow-up of chronic medical conditions including hypertension, chronic A-fib, dyslipidemia, and prediabetes Overall doing well. Recent cardiologist office visit notes reviewed Recent echocardiogram looks pretty good Has no complaints or any other medical concerns today. BP Readings from Last 3 Encounters:  03/07/24 126/74  09/07/23 126/84  07/06/23 (!) 140/82   Wt Readings from Last 3 Encounters:  03/07/24 183 lb (83 kg)  09/07/23 182 lb (82.6 kg)  07/06/23 183 lb (83 kg)     HPI   Prior to Admission medications   Medication Sig Start Date End Date Taking? Authorizing Provider  atenolol -chlorthalidone  (TENORETIC ) 100-25 MG tablet Take 1 tablet by mouth once daily 12/28/23  Yes Caedyn Raygoza, Emil Schanz, MD  flecainide  (TAMBOCOR ) 50 MG tablet Take 1 tablet (50 mg total) by mouth 2 (two) times daily. 07/06/23  Yes Patwardhan, Manish J, MD  OMEGA-3 KRILL OIL PO Take by mouth daily.   Yes [provider]  rivaroxaban  (XARELTO ) 20 MG TABS tablet Take 1 tablet (20 mg total) by mouth daily with supper. 07/06/23  Yes Patwardhan, Newman PARAS, MD  rosuvastatin  (CRESTOR ) 10 MG tablet Take 1 tablet by mouth once daily 12/31/23  Yes Lovette Merta, Emil Schanz, MD    No Known Allergies  Patient Active Problem List   Diagnosis Date Noted   Mixed hyperlipidemia 07/06/2023   Dyslipidemia 08/26/2021   Prediabetes 08/26/2021   Current use of long term anticoagulation 02/04/2021   Thrombophilia (HCC) 02/04/2021   Atrial flutter (HCC) 02/10/2019   Atrial fibrillation (HCC) 03/09/2017   Obesity (BMI 30.0-34.9) 04/28/2012   Essential hypertension 01/22/2012    Past Medical History:  Diagnosis Date   A-fib (HCC)    Hyperlipidemia    Hypertension     Past Surgical History:  Procedure Laterality  Date   None Surgeries      Social History   Socioeconomic History   Marital status: Married    Spouse name: Not on file   Number of children: 4   Years of education: Not on file   Highest education level: Not on file  Occupational History   Not on file  Tobacco Use   Smoking status: Never   Smokeless tobacco: Never  Vaping Use   Vaping status: Never Used  Substance and Sexual Activity   Alcohol use: No   Drug use: No   Sexual activity: Yes    Partners: Male  Other Topics Concern   Not on file  Social History Narrative   Married   4 grown children   Restaurant Cook   Social Drivers of Health   Financial Resource Strain: Low Risk  (05/16/2019)   Overall Financial Resource Strain (CARDIA)    Difficulty of Paying Living Expenses: Not hard at all  Food Insecurity: No Food Insecurity (05/16/2019)   Hunger Vital Sign    Worried About Running Out of Food in the Last Year: Never true    Ran Out of Food in the Last Year: Never true  Transportation Needs: No Transportation Needs (05/16/2019)   PRAPARE - Administrator, Civil Service (Medical): No    Lack of Transportation (Non-Medical): No  Physical Activity: Insufficiently Active (05/16/2019)   Exercise Vital Sign    Days of Exercise per Week: 3 days  Minutes of Exercise per Session: 30 min  Stress: No Stress Concern Present (05/16/2019)   [employer] of Occupational Health - Occupational Stress Questionnaire    Feeling of Stress : Only a little  Social Connections: Unknown (05/16/2019)   Social Connection and Isolation Panel    Frequency of Communication with Friends and Family: Three times a week    Frequency of Social Gatherings with Friends and Family: Twice a week    Attends Religious Services: Patient declined    Database administrator or Organizations: Patient declined    Attends Banker Meetings: Patient declined    Marital Status: Married  Catering manager Violence: Not At Risk  (05/16/2019)   Humiliation, Afraid, Rape, and Kick questionnaire    Fear of Current or Ex-Partner: No    Emotionally Abused: No    Physically Abused: No    Sexually Abused: No    No family history on file.   Review of Systems  Constitutional: Negative.  Negative for chills and fever.  HENT: Negative.  Negative for congestion and sore throat.   Respiratory: Negative.  Negative for cough and shortness of breath.   Cardiovascular: Negative.  Negative for chest pain and palpitations.  Gastrointestinal:  Negative for abdominal pain, diarrhea, nausea and vomiting.  Genitourinary: Negative.  Negative for dysuria and hematuria.  Skin: Negative.  Negative for rash.  Neurological: Negative.  Negative for dizziness and headaches.  All other systems reviewed and are negative.   Vitals:   03/07/24 0803  BP: 126/74  Pulse: 75  Temp: 97.6 F (36.4 C)  SpO2: 98%    Physical Exam Vitals reviewed.  Constitutional:      Appearance: Normal appearance.  HENT:     Head: Normocephalic.     Mouth/Throat:     Mouth: Mucous membranes are moist.     Pharynx: Oropharynx is clear.  Eyes:     Extraocular Movements: Extraocular movements intact.     Conjunctiva/sclera: Conjunctivae normal.     Pupils: Pupils are equal, round, and reactive to light.  Cardiovascular:     Rate and Rhythm: Normal rate and regular rhythm.     Pulses: Normal pulses.     Heart sounds: Normal heart sounds.  Pulmonary:     Effort: Pulmonary effort is normal.     Breath sounds: Normal breath sounds.  Abdominal:     Palpations: Abdomen is soft.     Tenderness: There is no abdominal tenderness.  Musculoskeletal:     Cervical back: No tenderness.  Lymphadenopathy:     Cervical: No cervical adenopathy.  Skin:    General: Skin is warm and dry.     Capillary Refill: Capillary refill takes less than 2 seconds.  Neurological:     General: No focal deficit present.     Mental Status: She is alert and oriented to  person, place, and time.  Psychiatric:        Mood and Affect: Mood normal.        Behavior: Behavior normal.      ASSESSMENT & PLAN: A total of 45 minutes was spent with the patient and counseling/coordination of care regarding preparing for this visit, review of most recent office visit notes, review of cardiologist most recent office visit notes, review of most recent echocardiogram report, review of multiple chronic medical conditions and their management, review of all medications, review of most recent bloodwork results, review of health maintenance items, education on nutrition, prognosis, documentation, and need for follow  up.   Problem List Items Addressed This Visit       Cardiovascular and Mediastinum   Essential hypertension - Primary   BP Readings from Last 3 Encounters:  03/07/24 126/74  09/07/23 126/84  07/06/23 (!) 140/82  Well-controlled hypertension Continue Tenoretic  100-25 mg daily Cardiovascular risks associated with hypertension discussed Dietary approaches to stop hypertension discussed       Atrial fibrillation (HCC)   Presently in sinus rhythm with good ventricular response Continues Tenoretic  100-25 mg daily and flecainide  50 mg twice a day Also continues daily Xarelto  20 mg Recent cardiologist visit office visit notes reviewed Recent echocardiogram report reviewed. Normal pumping function of the heart. No severe heart valve abnormalities noted.         Other   Current use of long term anticoagulation   No clinical bleeding episodes. Fall precautions given Continues Xarelto  20 mg daily      Dyslipidemia   Diet and nutrition discussed. Continues rosuvastatin  10 mg daily      Prediabetes   Lab Results  Component Value Date   HGBA1C 5.7 09/07/2023  Diet and nutrition discussed. Advised to decrease amount of daily carbohydrate intake and daily calories and increase amount of plant-based protein in her diet       Patient Instructions   Health Maintenance After Age 91 After age 23, you are at a higher risk for certain long-term diseases and infections as well as injuries from falls. Falls are a major cause of broken bones and head injuries in people who are older than age 80. Getting regular preventive care can help to keep you healthy and well. Preventive care includes getting regular testing and making lifestyle changes as recommended by your health care provider. Talk with your health care provider about: Which screenings and tests you should have. A screening is a test that checks for a disease when you have no symptoms. A diet and exercise plan that is right for you. What should I know about screenings and tests to prevent falls? Screening and testing are the best ways to find a health problem early. Early diagnosis and treatment give you the best chance of managing medical conditions that are common after age 71. Certain conditions and lifestyle choices may make you more likely to have a fall. Your health care provider may recommend: Regular vision checks. Poor vision and conditions such as cataracts can make you more likely to have a fall. If you wear glasses, make sure to get your prescription updated if your vision changes. Medicine review. Work with your health care provider to regularly review all of the medicines you are taking, including over-the-counter medicines. Ask your health care provider about any side effects that may make you more likely to have a fall. Tell your health care provider if any medicines that you take make you feel dizzy or sleepy. Strength and balance checks. Your health care provider may recommend certain tests to check your strength and balance while standing, walking, or changing positions. Foot health exam. Foot pain and numbness, as well as not wearing proper footwear, can make you more likely to have a fall. Screenings, including: Osteoporosis screening. Osteoporosis is a condition that causes  the bones to get weaker and break more easily. Blood pressure screening. Blood pressure changes and medicines to control blood pressure can make you feel dizzy. Depression screening. You may be more likely to have a fall if you have a fear of falling, feel depressed, or feel unable to do  activities that you used to do. Alcohol use screening. Using too much alcohol can affect your balance and may make you more likely to have a fall. Follow these instructions at home: Lifestyle Do not drink alcohol if: Your health care provider tells you not to drink. If you drink alcohol: Limit how much you have to: 0-1 drink a day for women. 0-2 drinks a day for men. Know how much alcohol is in your drink. In the [place] one drink equals one 12 oz bottle of beer (355 mL), one 5 oz glass of wine (148 mL), or one 1 oz glass of hard liquor (44 mL). Do not use any products that contain nicotine or tobacco. These products include cigarettes, chewing tobacco, and vaping devices, such as e-cigarettes. If you need help quitting, ask your health care provider. Activity  Follow a regular exercise program to stay fit. This will help you maintain your balance. Ask your health care provider what types of exercise are appropriate for you. If you need a cane or walker, use it as recommended by your health care provider. Wear supportive shoes that have nonskid soles. Safety  Remove any tripping hazards, such as rugs, cords, and clutter. Install safety equipment such as grab bars in bathrooms and safety rails on stairs. Keep rooms and walkways well-lit. General instructions Talk with your health care provider about your risks for falling. Tell your health care provider if: You fall. Be sure to tell your health care provider about all falls, even ones that seem minor. You feel dizzy, tiredness (fatigue), or off-balance. Take over-the-counter and prescription medicines only as told by your health care provider. These include  supplements. Eat a healthy diet and maintain a healthy weight. A healthy diet includes low-fat dairy products, low-fat (lean) meats, and fiber from whole grains, beans, and lots of fruits and vegetables. Stay current with your vaccines. Schedule regular health, dental, and eye exams. Summary Having a healthy lifestyle and getting preventive care can help to protect your health and wellness after age 30. Screening and testing are the best way to find a health problem early and help you avoid having a fall. Early diagnosis and treatment give you the best chance for managing medical conditions that are more common for people who are older than age 7. Falls are a major cause of broken bones and head injuries in people who are older than age 46. Take precautions to prevent a fall at home. Work with your health care provider to learn what changes you can make to improve your health and wellness and to prevent falls. This information is not intended to replace advice given to you by your health care provider. Make sure you discuss any questions you have with your health care provider. Document Revised: 12/03/2020 Document Reviewed: 12/03/2020 Elsevier Patient Education  2024 Elsevier Inc.     Emil Schaumann, MD [facility]

## 2024-03-07 NOTE — Assessment & Plan Note (Signed)
 Lab Results  Component Value Date   HGBA1C 5.7 09/07/2023  Diet and nutrition discussed. Advised to decrease amount of daily carbohydrate intake and daily calories and increase amount of plant-based protein in her diet

## 2024-03-07 NOTE — Patient Instructions (Signed)
 Health Maintenance After Age 68 After age 27, you are at a higher risk for certain long-term diseases and infections as well as injuries from falls. Falls are a major cause of broken bones and head injuries in people who are older than age 73. Getting regular preventive care can help to keep you healthy and well. Preventive care includes getting regular testing and making lifestyle changes as recommended by your health care provider. Talk with your health care provider about: Which screenings and tests you should have. A screening is a test that checks for a disease when you have no symptoms. A diet and exercise plan that is right for you. What should I know about screenings and tests to prevent falls? Screening and testing are the best ways to find a health problem early. Early diagnosis and treatment give you the best chance of managing medical conditions that are common after age 90. Certain conditions and lifestyle choices may make you more likely to have a fall. Your health care provider may recommend: Regular vision checks. Poor vision and conditions such as cataracts can make you more likely to have a fall. If you wear glasses, make sure to get your prescription updated if your vision changes. Medicine review. Work with your health care provider to regularly review all of the medicines you are taking, including over-the-counter medicines. Ask your health care provider about any side effects that may make you more likely to have a fall. Tell your health care provider if any medicines that you take make you feel dizzy or sleepy. Strength and balance checks. Your health care provider may recommend certain tests to check your strength and balance while standing, walking, or changing positions. Foot health exam. Foot pain and numbness, as well as not wearing proper footwear, can make you more likely to have a fall. Screenings, including: Osteoporosis screening. Osteoporosis is a condition that causes  the bones to get weaker and break more easily. Blood pressure screening. Blood pressure changes and medicines to control blood pressure can make you feel dizzy. Depression screening. You may be more likely to have a fall if you have a fear of falling, feel depressed, or feel unable to do activities that you used to do. Alcohol  use screening. Using too much alcohol  can affect your balance and may make you more likely to have a fall. Follow these instructions at home: Lifestyle Do not drink alcohol  if: Your health care provider tells you not to drink. If you drink alcohol : Limit how much you have to: 0-1 drink a day for women. 0-2 drinks a day for men. Know how much alcohol  is in your drink. In the [place] one drink equals one 12 oz bottle of beer (355 mL), one 5 oz glass of wine (148 mL), or one 1 oz glass of hard liquor (44 mL). Do not use any products that contain nicotine or tobacco. These products include cigarettes, chewing tobacco, and vaping devices, such as e-cigarettes. If you need help quitting, ask your health care provider. Activity  Follow a regular exercise program to stay fit. This will help you maintain your balance. Ask your health care provider what types of exercise are appropriate for you. If you need a cane or walker, use it as recommended by your health care provider. Wear supportive shoes that have nonskid soles. Safety  Remove any tripping hazards, such as rugs, cords, and clutter. Install safety equipment such as grab bars in bathrooms and safety rails on stairs. Keep rooms and walkways  well-lit. General instructions Talk with your health care provider about your risks for falling. Tell your health care provider if: You fall. Be sure to tell your health care provider about all falls, even ones that seem minor. You feel dizzy, tiredness (fatigue), or off-balance. Take over-the-counter and prescription medicines only as told by your health care provider. These include  supplements. Eat a healthy diet and maintain a healthy weight. A healthy diet includes low-fat dairy products, low-fat (lean) meats, and fiber from whole grains, beans, and lots of fruits and vegetables. Stay current with your vaccines. Schedule regular health, dental, and eye exams. Summary Having a healthy lifestyle and getting preventive care can help to protect your health and wellness after age 15. Screening and testing are the best way to find a health problem early and help you avoid having a fall. Early diagnosis and treatment give you the best chance for managing medical conditions that are more common for people who are older than age 42. Falls are a major cause of broken bones and head injuries in people who are older than age 64. Take precautions to prevent a fall at home. Work with your health care provider to learn what changes you can make to improve your health and wellness and to prevent falls. This information is not intended to replace advice given to you by your health care provider. Make sure you discuss any questions you have with your health care provider. Document Revised: 12/03/2020 Document Reviewed: 12/03/2020 Elsevier Patient Education  2024 [employer].

## 2024-03-07 NOTE — Assessment & Plan Note (Signed)
Diet and nutrition discussed Continues rosuvastatin 10 mg daily

## 2024-04-10 ENCOUNTER — Other Ambulatory Visit: Payer: Self-pay | Admitting: Emergency Medicine

## 2024-04-10 DIAGNOSIS — E785 Hyperlipidemia, unspecified: Secondary | ICD-10-CM

## 2024-06-19 ENCOUNTER — Other Ambulatory Visit: Payer: Self-pay | Admitting: Cardiology

## 2024-06-19 DIAGNOSIS — I480 Paroxysmal atrial fibrillation: Secondary | ICD-10-CM

## 2024-06-21 NOTE — Telephone Encounter (Signed)
 Xarelto  20mg  refill request received. Pt is 68 years old, weight-83kg, Crea-0.92 on 09/07/23, last seen by Dr. [place] on 07/06/23, Diagnosis-Afib, CrCl-91.47 mL/min; Dose is appropriate based on dosing criteria. Will send in refill to requested pharmacy.     Note sent to Schedulers to reach out for an appointment since due in December.

## 2024-07-05 ENCOUNTER — Other Ambulatory Visit: Payer: Self-pay | Admitting: Emergency Medicine

## 2024-07-05 DIAGNOSIS — E785 Hyperlipidemia, unspecified: Secondary | ICD-10-CM

## 2024-07-13 ENCOUNTER — Ambulatory Visit: Admitting: Cardiology

## 2024-09-12 ENCOUNTER — Ambulatory Visit: Admitting: Cardiology

## 2024-10-10 ENCOUNTER — Ambulatory Visit: Admitting: Emergency Medicine
